# Patient Record
Sex: Female | Born: 1980 | Race: Black or African American | Hispanic: No | Marital: Single | Smoking: Former smoker
Health system: Southern US, Community
[De-identification: ages and names within clinical notes are randomized; demographics above are authoritative.]

## PROBLEM LIST (undated history)

## (undated) DIAGNOSIS — C801 Malignant (primary) neoplasm, unspecified: Secondary | ICD-10-CM

## (undated) DIAGNOSIS — K219 Gastro-esophageal reflux disease without esophagitis: Secondary | ICD-10-CM

## (undated) DIAGNOSIS — D573 Sickle-cell trait: Secondary | ICD-10-CM

## (undated) DIAGNOSIS — Z9221 Personal history of antineoplastic chemotherapy: Secondary | ICD-10-CM

## (undated) DIAGNOSIS — M419 Scoliosis, unspecified: Secondary | ICD-10-CM

---

## 2010-07-19 ENCOUNTER — Ambulatory Visit: Payer: Self-pay

## 2015-06-25 HISTORY — PX: INDUCED ABORTION: SHX677

## 2017-08-21 ENCOUNTER — Ambulatory Visit: Payer: Self-pay | Admitting: Family Medicine

## 2018-06-24 DIAGNOSIS — C50919 Malignant neoplasm of unspecified site of unspecified female breast: Secondary | ICD-10-CM

## 2018-06-24 HISTORY — DX: Malignant neoplasm of unspecified site of unspecified female breast: C50.919

## 2019-04-21 ENCOUNTER — Other Ambulatory Visit: Payer: Self-pay | Admitting: Obstetrics and Gynecology

## 2019-04-21 DIAGNOSIS — N6320 Unspecified lump in the left breast, unspecified quadrant: Secondary | ICD-10-CM

## 2019-04-21 DIAGNOSIS — R921 Mammographic calcification found on diagnostic imaging of breast: Secondary | ICD-10-CM

## 2019-04-26 ENCOUNTER — Other Ambulatory Visit: Payer: Self-pay | Admitting: Hematology

## 2019-04-28 ENCOUNTER — Ambulatory Visit
Admission: RE | Admit: 2019-04-28 | Discharge: 2019-04-28 | Disposition: A | Discharge status: Home / Self Care | Payer: BC Managed Care – PPO | Source: Ambulatory Visit | Attending: Obstetrics and Gynecology | Admitting: Obstetrics and Gynecology

## 2019-04-28 ENCOUNTER — Other Ambulatory Visit: Payer: Self-pay

## 2019-04-28 ENCOUNTER — Other Ambulatory Visit: Payer: Self-pay | Admitting: Obstetrics and Gynecology

## 2019-04-28 DIAGNOSIS — N6320 Unspecified lump in the left breast, unspecified quadrant: Secondary | ICD-10-CM

## 2019-04-28 DIAGNOSIS — R921 Mammographic calcification found on diagnostic imaging of breast: Secondary | ICD-10-CM

## 2019-04-30 ENCOUNTER — Encounter: Payer: Self-pay | Admitting: *Deleted

## 2019-04-30 ENCOUNTER — Telehealth: Payer: Self-pay | Admitting: Hematology

## 2019-04-30 ENCOUNTER — Other Ambulatory Visit: Payer: Self-pay | Admitting: *Deleted

## 2019-04-30 DIAGNOSIS — Z170 Estrogen receptor positive status [ER+]: Secondary | ICD-10-CM | POA: Insufficient documentation

## 2019-04-30 DIAGNOSIS — C50412 Malignant neoplasm of upper-outer quadrant of left female breast: Secondary | ICD-10-CM | POA: Insufficient documentation

## 2019-04-30 NOTE — Telephone Encounter (Signed)
Spoke with patient to confirm afternoon BC on 11/11 packet will be emailed to patient

## 2019-05-03 NOTE — Progress Notes (Signed)
[place]   [phone] Fax:([phone]) [place] Note   Patient Care Team: [name], MD as PCP - General (Obstetrics and Gynecology) Mauro Kaufmann, RN as Oncology Nurse Navigator Rockwell Germany, RN as Oncology Nurse Navigator Donnie Mesa, MD as Consulting Physician (General Surgery) Truitt Merle, MD as Consulting Physician (Hematology) Eppie Gibson, MD as Attending Physician (Radiation Oncology)  Date of Service:  05/05/2019   CHIEF COMPLAINTS/PURPOSE OF CONSULTATION:  Newly diagnosed Malignant neoplasm of upper-outer quadrant of left breast    Oncology History  Malignant neoplasm of upper-outer quadrant of left breast in female, estrogen receptor positive (Victoria Barker)  04/21/2019 Mammogram   Diagnostic mammogram 04/21/19  IMPRESSION 1. findings highly suspicious for left breast cancer. There is a palpable irregular mass 4cm from nipple measuring 2.4x2.4x1.8cm in the 1:00 position axis of the left breast and pleomorphic  calcifications in the upper-outer quadrant as described above.  2. single left 0.9x0.5cm axillary lymph node with a mild/borderline thickened cortex.    04/28/2019 Cancer Staging   Staging form: Breast, AJCC 8th Edition - Clinical stage from 04/28/2019: Stage IIA (cT2, cN0, cM0, G2, ER+, PR-, HER2: Equivocal) - Signed by Truitt Merle, MD on 05/04/2019   04/28/2019 Initial Biopsy   Diagnosis 04/28/19 1. Breast, left, needle core biopsy, 1 o'clock position - INVASIVE DUCTAL CARCINOMA. - DUCTAL CARCINOMA IN SITU. - SEE COMMENT. 2. Lymph node, needle/core biopsy, left axilla - THERE IS NO EVIDENCE OF CARCINOMA IN 1 OF 1 LYMPH NODE (0/1). 3. Breast, left, needle core biopsy, upper, outer quadrant - DUCTAL CARCINOMA IN SITU WITH CALCIFICATIONS. - SEE COMMENT.   04/28/2019 Receptors her2   1. PROGNOSTIC INDICATORS Results: HER2 negative  Estrogen Receptor: 80%, POSITIVE, STRONG STAINING INTENSITY Progesterone  Receptor: 0%, NEGATIVE Proliferation Marker Ki67: 35%    04/30/2019 Initial Diagnosis   Malignant neoplasm of upper-outer quadrant of left breast in female, estrogen receptor positive (Victoria Barker)      HISTORY OF PRESENTING ILLNESS:  Victoria Barker 38 y.o. female is a here because of newly diagnosed left breast cancer. The patient presents to the Breast clinic today accompanied by her friend.  She notes she felt the lump herself. She has had lump breast before and felt present and did not resolve since June or July 2020. She notes she went to have mammogram and biopsy to confirm breast cancer. She notes this is not painful and denies any other breast, weight, appetite, chest or GI change. She feels she sleeps adequately. She is overall nervous of her diagnosis.   Socially she works at [employer] in [place]. She currently has boyfriend. She has 2 children, 89 and 87 years old who she lives with. She notes she smokes Jewel pods that contains nicotine. She has been doing this for 3 years. She used to smoke cigarettes for 2 years less than 1/2ppd.   She has no past medical history other than acid reflux, on dexilant and well controlled. She has no history of major surgery. She denies family history of cancer. She notes HTN runs in her family with MGM and mother. She sees her Gyn Dr. Adah Perl and does not have a set PCP.    GYN HISTORY  Menarchal: 13 LMP: NA, on concentrations  Contraceptive: Depo shot for 8 years, Mirena (past 2 years) HRT: NA G2P2: first at age 46   REVIEW OF SYSTEMS:    Constitutional: Denies fevers, chills or abnormal night sweats Eyes: Denies blurriness of vision, double  vision or watery eyes Ears, nose, mouth, throat, and face: Denies mucositis or sore throat Respiratory: Denies cough, dyspnea or wheezes Cardiovascular: Denies palpitation, chest discomfort or lower extremity swelling Gastrointestinal:  Denies nausea, heartburn or change in bowel habits Skin: Denies  abnormal skin rashes Lymphatics: Denies new lymphadenopathy or easy bruising Neurological:Denies numbness, tingling or new weaknesses Behavioral/Psych: Mood is stable, no new changes (+) anxious, nervous  BREAST: (+) enlargement of right breast  All other systems were reviewed with the patient and are negative.   MEDICAL HISTORY:  History reviewed. No pertinent past medical history.  SURGICAL HISTORY: History reviewed. No pertinent surgical history.  SOCIAL HISTORY: Social History   Socioeconomic History   Marital status: Single    Spouse name: Not on file   Number of children: 2   Years of education: Not on file   Highest education level: Not on file  Occupational History   Occupation: [employer]   Social Needs   Financial resource strain: Not on file   Food insecurity    Worry: Not on file    Inability: Not on file   Transportation needs    Medical: Not on file    Non-medical: Not on file  Tobacco Use   Smoking status: Former Smoker    Packs/day: 0.25    Years: 5.00    Pack years: 1.25   Smokeless tobacco: Current User  Substance and Sexual Activity   Alcohol use: Not Currently   Drug use: Yes    Types: Marijuana   Sexual activity: Not on file  Lifestyle   Physical activity    Days per week: Not on file    Minutes per session: Not on file   Stress: Not on file  Relationships   Social connections    Talks on phone: Not on file    Gets together: Not on file    Attends religious service: Not on file    Active member of club or organization: Not on file    Attends meetings of clubs or organizations: Not on file    Relationship status: Not on file   Intimate partner violence    Fear of current or ex partner: Not on file    Emotionally abused: Not on file    Physically abused: Not on file    Forced sexual activity: Not on file  Other Topics Concern   Not on file  Social History Narrative   Not on file    FAMILY HISTORY: Family History    Problem Relation Age of Onset   Hypertension Mother    Hypertension Maternal Grandmother     ALLERGIES:  has No Known Allergies.  MEDICATIONS:  Current Outpatient Medications  Medication Sig Dispense Refill   dexlansoprazole (DEXILANT) 60 MG capsule Take 60 mg by mouth daily.     Lactobacillus-Inulin ([place] PO) Take by mouth.     MULTIPLE VITAMIN PO Take by mouth.     No current facility-administered medications for this visit.     PHYSICAL EXAMINATION: ECOG PERFORMANCE STATUS: 0 - Asymptomatic  Vitals:   05/05/19 1235  BP: 112/83  Pulse: 89  Resp: 18  Temp: 98.7 F (37.1 C)  SpO2: 100%   Filed Weights   05/05/19 1235  Weight: 117 lb 11.2 oz (53.4 kg)    GENERAL:alert, no distress and comfortable SKIN: skin color, texture, turgor are normal, no rashes or significant lesions EYES: normal, Conjunctiva are pink and non-injected, sclera clear  NECK: supple, thyroid normal size,  non-tender, without nodularity LYMPH:  no palpable lymphadenopathy in the cervical, axillary  LUNGS: clear to auscultation and percussion with normal breathing effort HEART: regular rate & rhythm and no murmurs and no lower extremity edema ABDOMEN:abdomen soft, non-tender and normal bowel sounds Musculoskeletal:no cyanosis of digits and no clubbing  NEURO: alert & oriented x 3 with fluent speech, no focal motor/sensory deficits BREAST: (+) Lumpy breast tissue (+) palpable mass 4X5cm at 1:00 position of right breast. No palpable mass, nodules or adenopathy bilaterally. Breast exam benign.  LABORATORY DATA:  I have reviewed the data as listed CBC Latest Ref Rng & Units 05/05/2019  WBC 4.0 - 10.5 K/uL 5.8  Hemoglobin 12.0 - 15.0 g/dL 12.9  Hematocrit 36.0 - 46.0 % 37.9  Platelets 150 - 400 K/uL 205    CMP Latest Ref Rng & Units 05/05/2019  Glucose 70 - 99 mg/dL 152(H)  BUN 6 - 20 mg/dL 11  Creatinine 0.44 - 1.00 mg/dL 0.85  Sodium 135 - 145 mmol/L 138  Potassium  3.5 - 5.1 mmol/L 3.7  Chloride 98 - 111 mmol/L 103  CO2 22 - 32 mmol/L 28  Calcium 8.9 - 10.3 mg/dL 9.1  Total Protein 6.5 - 8.1 g/dL 6.9  Total Bilirubin 0.3 - 1.2 mg/dL 0.5  Alkaline Phos 38 - 126 U/L 52  AST 15 - 41 U/L 15  ALT 0 - 44 U/L 9     RADIOGRAPHIC STUDIES: I have personally reviewed the radiological images as listed and agreed with the findings in the report. [place] Axillary Node Core Biopsy Left  Addendum Date: 04/29/2019   ADDENDUM REPORT: 04/29/2019 15:41 ADDENDUM: Pathology revealed 1- GRADE II INVASIVE DUCTAL CARCINOMA of the LEFT breast, 1 o'clock position. SEE COMMENT. This was found to be concordant by Dr. Lajean Manes. Pathology revealed 2- THERE IS NO EVIDENCE OF CARCINOMA IN 1 OF 1 LYMPH NODE (0/1) of the LEFT axilla. This was found to be concordant by Dr. Lajean Manes. 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 SEE COMMENT. This was found to be concordant by Dr. Lajean Manes. Microscopic Comment: 1. The carcinoma appears grade II. A breast prognostic profile will be performed and the results reported separately. 3. The ductal carcinoma in situ is very focal. Estrogen receptor and progesterone receptor studies will be attempted on part 3 and the results reported separately. Dr. Carollee Herter of [place] discussed Pathology results by telephone with her patient. The patient reported doing well after the biopsies with tenderness at the sites. Post biopsy instructions and care were reviewed and questions were answered. The patient was encouraged to call [facility] for any additional concerns. Per patient request, a referral was made to [facility] at [facility] on May 05, 2019. Pathology results reported by Stacie Acres, RN on 04/29/2019. Electronically Signed   By: Lajean Manes M.D.   On:  04/29/2019 15:41   Result Date: 04/29/2019 CLINICAL DATA:  Patient presents for ultrasound-guided core needle biopsy of a spiculated upper-outer quadrant left breast mass as well as a borderline abnormal left axillary lymph node. She is also scheduled to undergo stereotactic biopsy of calcifications extending anterior from the left breast mass. EXAM: ULTRASOUND GUIDED LEFT BREAST CORE NEEDLE BIOPSY ULTRASOUND GUIDED LEFT AXILLARY LYMPH NODE CORE NEEDLE BIOPSY COMPARISON:  Previous exam(s). FINDINGS: I met with the patient and we discussed the procedure of ultrasound-guided biopsy,  including benefits and alternatives. We discussed the high likelihood of a successful procedure. We discussed the risks of the procedure, including infection, bleeding, tissue injury, clip migration, and inadequate sampling. Informed written consent was given. The usual time-out protocol was performed immediately prior to the procedure. Lesion #1: Breast Mass. Lesion quadrant: Upper outer quadrant Using sterile technique and 1% Lidocaine as local anesthetic, under direct ultrasound visualization, a 12 gauge spring-loaded device was used to perform biopsy of the 1 o'clock position spiculated left breast mass using a inferolateral approach. At the conclusion of the procedure ribbon shaped tissue marker clip was deployed into the biopsy cavity. Lesion #2: Axillary Lymph Node. Lesion location: Left axilla. Using sterile technique and 1% Lidocaine as local anesthetic, under direct ultrasound visualization, a 14 gauge spring-loaded device was used to perform biopsy of the borderline abnormal left axillary lymph node using a inferolateral approach. At the conclusion of the procedure Q shaped,HydroMARK tissue marker clip was deployed into the biopsy cavity. Follow up 2 view mammogram was performed and dictated separately. IMPRESSION: Ultrasound guided biopsy of a left breast mass and a borderline abnormal left axillary lymph node. No apparent  complications. Electronically Signed: By: Lajean Manes M.D. On: 04/28/2019 08:25   Mm Clip Placement Left  Result Date: 04/28/2019 CLINICAL DATA:  Post biopsy marker clip placement evaluation. Patient underwent ultrasound-guided core needle biopsy of a left breast mass, a borderline abnormal left axillary lymph node and stereotactic core needle biopsy of calcifications. EXAM: DIAGNOSTIC LEFT MAMMOGRAM POST ULTRASOUND AND STEREOTACTIC BIOPSY COMPARISON:  Previous exam(s). FINDINGS: Mammographic images were obtained following ultrasound-guided and stereotactic guided biopsy of a left breast mass, borderline abnormal left axillary lymph node and calcifications extending linearly anteriorly from the left breast mass. The ribbon shaped biopsy clip lies within the posterosuperior margin of the left breast mass, the coil shaped biopsy clip lies approximately 5 mm superior and lateral to the residual linear left breast calcifications, 4.3 cm anterior to the ribbon clip. The Q shaped is partly imaged in the left axilla. IMPRESSION: Appropriate positioning of the ribbon shaped, coil shaped and Q shaped biopsy marking clips at the site of biopsy in the left breast associated with the mass, anterior upper outer quadrant along the linear calcifications and in the left axilla. Final Assessment: Post Procedure Mammograms for Marker Placement Electronically Signed   By: Lajean Manes M.D.   On: 04/28/2019 09:09   Mm Lt Breast Bx W Loc Dev 1st Lesion Image Bx Spec Stereo Guide  Addendum Date: 04/29/2019   ADDENDUM REPORT: 04/29/2019 15:43 ADDENDUM: Pathology revealed 1- GRADE II INVASIVE DUCTAL CARCINOMA of the LEFT breast, 1 o'clock position. SEE COMMENT. This was found to be concordant by Dr. Lajean Manes. Pathology revealed 2- THERE IS NO EVIDENCE OF CARCINOMA IN 1 OF 1 LYMPH NODE (0/1) of the LEFT axilla. This was found to be concordant by Dr. Lajean Manes. Pathology revealed 3- DUCTAL CARCINOMA IN SITU WITH  CALCIFICATIONS of the LEFT breast, upper outer quadrant. SEE COMMENT. This was found to be concordant by Dr. Lajean Manes. Microscopic Comment: 1. The carcinoma appears grade II. A breast prognostic profile will be performed and the results reported separately. 3. The ductal carcinoma in situ is very focal. Estrogen receptor and progesterone receptor studies will be attempted on part 3 and the results reported separately. Dr. Carollee Herter of [place] discussed Pathology results by telephone with her patient. The patient reported doing well after the biopsies with tenderness at the  sites. Post biopsy instructions and care were reviewed and questions were answered. The patient was encouraged to call [facility] for any additional concerns. Per patient request, a referral was made to [facility] at [facility] on May 05, 2019. Pathology results reported by Stacie Acres, RN on 04/29/2019. Electronically Signed   By: Lajean Manes M.D.   On: 04/29/2019 15:43   Result Date: 04/29/2019 CLINICAL DATA:  Patient presents for stereotactic core needle biopsy of calcifications that extend anterior to a highly suspicious left breast mass, biopsy earlier today under ultrasound guidance. EXAM: LEFT BREAST STEREOTACTIC CORE NEEDLE BIOPSY COMPARISON:  Previous exams. FINDINGS: The patient and I discussed the procedure of stereotactic-guided biopsy including benefits and alternatives. We discussed the high likelihood of a successful procedure. We discussed the risks of the procedure including infection, bleeding, tissue injury, clip migration, and inadequate sampling. Informed written consent was given. The usual time out protocol was performed immediately prior to the procedure. Using sterile technique and 1% Lidocaine as local anesthetic, under stereotactic guidance, a 9 gauge vacuum assisted device was used to  perform core needle biopsy of calcifications in the upper outer quadrant of the left breast using a superior approach. Specimen radiograph was performed showing calcifications for which biopsy were performed. Specimens with calcifications are identified for pathology. Lesion quadrant: Upper outer quadrant At the conclusion of the procedure, coil shaped tissue marker clip was deployed into the biopsy cavity. Follow-up 2-view mammogram was performed and dictated separately. IMPRESSION: Stereotactic-guided biopsy of left breast calcifications. No apparent complications. Electronically Signed: By: Lajean Manes M.D. On: 04/28/2019 09:00   [place] Lt Breast Bx W Loc Dev 1st Lesion Img Bx Spec US Guide  Addendum Date: 04/29/2019   ADDENDUM REPORT: 04/29/2019 15:41 ADDENDUM: Pathology revealed 1- GRADE II INVASIVE DUCTAL CARCINOMA of the LEFT breast, 1 o'clock position. SEE COMMENT. This was found to be concordant by Dr. Lajean Manes. Pathology revealed 2- THERE IS NO EVIDENCE OF CARCINOMA IN 1 OF 1 LYMPH NODE (0/1) of the LEFT axilla. This was found to be concordant by Dr. Lajean Manes. 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 SEE COMMENT. This was found to be concordant by Dr. Lajean Manes. Microscopic Comment: 1. The carcinoma appears grade II. A breast prognostic profile will be performed and the results reported separately. 3. The ductal carcinoma in situ is very focal. Estrogen receptor and progesterone receptor studies will be attempted on part 3 and the results reported separately. Dr. Carollee Herter of [place] discussed Pathology results by telephone with her patient. The patient reported doing well after the biopsies with tenderness at the sites. Post biopsy instructions and care were reviewed and questions were answered. The patient was encouraged to call [facility] for any additional  concerns. Per patient request, a referral was made to [facility] at [facility] on May 05, 2019. Pathology results reported by Stacie Acres, RN on 04/29/2019. Electronically Signed   By: Lajean Manes M.D.   On: 04/29/2019 15:41   Result Date: 04/29/2019 CLINICAL DATA:  Patient presents for ultrasound-guided core needle biopsy of a spiculated upper-outer quadrant left breast mass as well as a borderline abnormal left axillary lymph node. She is also scheduled to undergo stereotactic biopsy of calcifications extending anterior from the left breast mass. EXAM: ULTRASOUND  GUIDED LEFT BREAST CORE NEEDLE BIOPSY ULTRASOUND GUIDED LEFT AXILLARY LYMPH NODE CORE NEEDLE BIOPSY COMPARISON:  Previous exam(s). FINDINGS: I met with the patient and we discussed the procedure of ultrasound-guided biopsy, including benefits and alternatives. We discussed the high likelihood of a successful procedure. We discussed the risks of the procedure, including infection, bleeding, tissue injury, clip migration, and inadequate sampling. Informed written consent was given. The usual time-out protocol was performed immediately prior to the procedure. Lesion #1: Breast Mass. Lesion quadrant: Upper outer quadrant Using sterile technique and 1% Lidocaine as local anesthetic, under direct ultrasound visualization, a 12 gauge spring-loaded device was used to perform biopsy of the 1 o'clock position spiculated left breast mass using a inferolateral approach. At the conclusion of the procedure ribbon shaped tissue marker clip was deployed into the biopsy cavity. Lesion #2: Axillary Lymph Node. Lesion location: Left axilla. Using sterile technique and 1% Lidocaine as local anesthetic, under direct ultrasound visualization, a 14 gauge spring-loaded device was used to perform biopsy of the borderline abnormal left axillary lymph node using a inferolateral approach. At the conclusion of the  procedure Q shaped,HydroMARK tissue marker clip was deployed into the biopsy cavity. Follow up 2 view mammogram was performed and dictated separately. IMPRESSION: Ultrasound guided biopsy of a left breast mass and a borderline abnormal left axillary lymph node. No apparent complications. Electronically Signed: By: Lajean Manes M.D. On: 04/28/2019 08:25    ASSESSMENT & PLAN:  Victoria Barker is a 38 y.o. African American female with a history of Acid Reflux   1 Malignant neoplasm of upper-outer quadrant of left breast, invasive ductal carcinoma and DCIS, stage IIA, c(T2N0M0), ER+/PR-/HER2-, Grade II -We discussed her image findings and the biopsy results in great details. She has 2 left breast masses with DCIS and 1 mass with Invasive ductal carcinoma, her abnormal lymph node was biopsied and found to be negative.  -Her invasive tumor was ER positive, PR negative, HER-2 equivocal by IHC, negative by FISH (per Dr. Jeral Pinch this morning in the conference) -Given 2 breast masses and abnormal LN she will have Breast MRI before she proceeds with surgery, she is agreeable.  -If breast MRI does not show more extensive disease, she likely is a candidate for lumpectomy with sentinel lymph node biopsy. She is agreeable with that. She was seen by Dr. Georgette Dover today and likely will proceed with surgery soon.  -I recommend a Oncotype Dx test on the surgical sample and we'll make a decision about adjuvant chemotherapy based on the Oncotype result. Written material of this test was given to her. She is young and fit, would be a good candidate for chemotherapy if her Oncotype recurrence score is high. -If her surgical sentinel lymph node positive, I recommend mammaprint for further risk stratification and guide adjuvant chemotherapy. -will probably repeat HER-2 on her surgical sample, if it's positive, will treatment with adjuvant chemotherapy and anti-HER-2 therapy. -She was also seen by radiation oncologist Dr. Isidore Moos  today. Post lumpectomy radiation is recommended to reduce the risk for local recurrence.  -Given the strong ER expression in her premenopausal status, I recommend adjuvant endocrine therapy with Tamoxifen or Aromatase inhibitor with Ovarian Suppressor/BSO for a total of 7-10 years to reduce the risk of cancer recurrence. Potential benefits and side effects were discussed with patient and she is interested. -She is currently on Mirena IUD which is estrogen based. I recommend she see Gyn for removal and to avoid estrogen based products, she is agreeable.  -We also  discussed the breast cancer surveillance after her surgery. She will continue annual screening mammogram, self exam, and a routine office visit with lab and exam with [place]. -I encouraged her to have healthy diet and exercise regularly -Labs reviewed, CBC and CMP WNL except BG 152. Physical exam shows 4-5cm palpable right breast mass.  -F/u after surgery or Radiation.    2. Nicotine use, Smoking Cessation  -She smoked cigarettes for 2 years, less than 1/2ppd. She currently smokes jewel pod containing nicotine for the past 3 years.  -I reviewed smoking cessation with her. She is willing to quit.   3. Acid Reflux -Well controlled on Dexilant   PLAN:  -Breast MRI in 1-2 weeks  -she will proceed with left breast surgery soon   -F/u after surgery or radiation   No orders of the defined types were placed in this encounter.   All questions were answered. The patient knows to call the clinic with any problems, questions or concerns. I spent 40 minutes counseling the patient face to face. The total time spent in the appointment was 50 minutes and more than 50% was on counseling.     Truitt Merle, MD 05/05/2019 4:42 PM  I, Joslyn Devon, am acting as scribe for Truitt Merle, MD.   I have reviewed the above documentation for accuracy and completeness, and I agree with the above.

## 2019-05-05 ENCOUNTER — Encounter: Payer: Self-pay | Admitting: Hematology

## 2019-05-05 ENCOUNTER — Other Ambulatory Visit: Payer: Self-pay

## 2019-05-05 ENCOUNTER — Ambulatory Visit
Admission: RE | Admit: 2019-05-05 | Discharge: 2019-05-05 | Disposition: A | Discharge status: Home / Self Care | Payer: BC Managed Care – PPO | Source: Ambulatory Visit | Attending: Radiation Oncology | Admitting: Radiation Oncology

## 2019-05-05 ENCOUNTER — Ambulatory Visit: Payer: BC Managed Care – PPO | Attending: Surgery | Admitting: Physical Therapy

## 2019-05-05 ENCOUNTER — Ambulatory Visit: Payer: Self-pay | Admitting: Surgery

## 2019-05-05 ENCOUNTER — Encounter: Payer: Self-pay | Admitting: Licensed Clinical Social Worker

## 2019-05-05 ENCOUNTER — Other Ambulatory Visit: Payer: Self-pay | Admitting: *Deleted

## 2019-05-05 ENCOUNTER — Inpatient Hospital Stay: Payer: BC Managed Care – PPO

## 2019-05-05 ENCOUNTER — Encounter: Payer: Self-pay | Admitting: Physical Therapy

## 2019-05-05 ENCOUNTER — Ambulatory Visit (HOSPITAL_BASED_OUTPATIENT_CLINIC_OR_DEPARTMENT_OTHER): Payer: BC Managed Care – PPO | Admitting: Licensed Clinical Social Worker

## 2019-05-05 ENCOUNTER — Inpatient Hospital Stay: Payer: BC Managed Care – PPO | Attending: Hematology | Admitting: Hematology

## 2019-05-05 VITALS — BP 112/83 | HR 89 | Temp 98.7°F | Resp 18 | Ht 62.0 in | Wt 117.7 lb

## 2019-05-05 DIAGNOSIS — Z170 Estrogen receptor positive status [ER+]: Secondary | ICD-10-CM | POA: Diagnosis not present

## 2019-05-05 DIAGNOSIS — C50412 Malignant neoplasm of upper-outer quadrant of left female breast: Secondary | ICD-10-CM

## 2019-05-05 DIAGNOSIS — F1290 Cannabis use, unspecified, uncomplicated: Secondary | ICD-10-CM | POA: Diagnosis not present

## 2019-05-05 DIAGNOSIS — R293 Abnormal posture: Secondary | ICD-10-CM | POA: Insufficient documentation

## 2019-05-05 DIAGNOSIS — F17290 Nicotine dependence, other tobacco product, uncomplicated: Secondary | ICD-10-CM | POA: Insufficient documentation

## 2019-05-05 DIAGNOSIS — K219 Gastro-esophageal reflux disease without esophagitis: Secondary | ICD-10-CM | POA: Insufficient documentation

## 2019-05-05 LAB — CMP (CANCER CENTER ONLY)
ALT: 9 U/L (ref 0–44)
AST: 15 U/L (ref 15–41)
Albumin: 4 g/dL (ref 3.5–5.0)
Alkaline Phosphatase: 52 U/L (ref 38–126)
Anion gap: 7 (ref 5–15)
BUN: 11 mg/dL (ref 6–20)
CO2: 28 mmol/L (ref 22–32)
Calcium: 9.1 mg/dL (ref 8.9–10.3)
Chloride: 103 mmol/L (ref 98–111)
Creatinine: 0.85 mg/dL (ref 0.44–1.00)
GFR, Est AFR Am: >60 mL/min (ref >60)
GFR, Estimated: >60 mL/min (ref >60)
Glucose, Bld: 152 mg/dL — ABNORMAL HIGH (ref 70–99)
Potassium: 3.7 mmol/L (ref 3.5–5.1)
Sodium: 138 mmol/L (ref 135–145)
Total Bilirubin: 0.5 mg/dL (ref 0.3–1.2)
Total Protein: 6.9 g/dL (ref 6.5–8.1)

## 2019-05-05 LAB — CBC WITH DIFFERENTIAL (CANCER CENTER ONLY)
Abs Immature Granulocytes: 0.01 10*3/uL (ref 0.00–0.07)
Basophils Absolute: 0.1 10*3/uL (ref 0.0–0.1)
Basophils Relative: 1 %
Eosinophils Absolute: 0.1 10*3/uL (ref 0.0–0.5)
Eosinophils Relative: 1 %
HCT: 37.9 % (ref 36.0–46.0)
Hemoglobin: 12.9 g/dL (ref 12.0–15.0)
Immature Granulocytes: 0 %
Lymphocytes Relative: 38 %
Lymphs Abs: 2.2 10*3/uL (ref 0.7–4.0)
MCH: 29.9 pg (ref 26.0–34.0)
MCHC: 34 g/dL (ref 30.0–36.0)
MCV: 87.7 fL (ref 80.0–100.0)
Monocytes Absolute: 0.3 10*3/uL (ref 0.1–1.0)
Monocytes Relative: 5 %
Neutro Abs: 3.1 10*3/uL (ref 1.7–7.7)
Neutrophils Relative %: 55 %
Platelet Count: 205 10*3/uL (ref 150–400)
RBC: 4.32 MIL/uL (ref 3.87–5.11)
RDW: 12.6 % (ref 11.5–15.5)
WBC Count: 5.8 10*3/uL (ref 4.0–10.5)
nRBC: 0 % (ref 0.0–0.2)

## 2019-05-05 NOTE — H&P (Signed)
History of Present Illness Victoria Barker. Victoria Swiney MD; 05/05/2019 3:14 PM) The patient is a 38 year old female who presents with breast cancer. [name]  This is a 38 year old female from [place], [place] who initially felt a palpable mass in her left breast about a year ago. This fluctuated in size initially but later this summer, became more prominent. She underwent work-up including biopsy that revealed invasive ductal carcinoma with DCIS at 0100. She also had some microcalcifications extending anteriorly from the mass in the LUOQ which revealed DCIS. A single lymph node was biopsied and was negative. ER+/ PR-, Ki67-35%.   Menarche - 45 First pregnancy - 17 Breastfeed - no OCP/ Depo Provera/ Mirena - current Family history - negative for breast cancer   CLINICAL DATA: Patient presents for ultrasound-guided core needle biopsy of a spiculated upper-outer quadrant left breast mass as well as a borderline abnormal left axillary lymph node. She is also scheduled to undergo stereotactic biopsy of calcifications extending anterior from the left breast mass.  EXAM: ULTRASOUND GUIDED LEFT BREAST CORE NEEDLE BIOPSY  ULTRASOUND GUIDED LEFT AXILLARY LYMPH NODE CORE NEEDLE BIOPSY  COMPARISON: Previous exam(s).  FINDINGS: I met with the patient and we discussed the procedure of ultrasound-guided biopsy, including benefits and alternatives. We discussed the high likelihood of a successful procedure. We discussed the risks of the procedure, including infection, bleeding, tissue injury, clip migration, and inadequate sampling. Informed written consent was given. The usual time-out protocol was performed immediately prior to the procedure.  Lesion #1: Breast Mass.  Lesion quadrant: Upper outer quadrant  Using sterile technique and 1% Lidocaine as local anesthetic, under direct ultrasound visualization, a 12 gauge spring-loaded device was used to perform biopsy of the  1 o'clock position spiculated left breast mass using a inferolateral approach. At the conclusion of the procedure ribbon shaped tissue marker clip was deployed into the biopsy cavity.  Lesion #2: Axillary Lymph Node.  Lesion location: Left axilla.  Using sterile technique and 1% Lidocaine as local anesthetic, under direct ultrasound visualization, a 14 gauge spring-loaded device was used to perform biopsy of the borderline abnormal left axillary lymph node using a inferolateral approach. At the conclusion of the procedure Q shaped,HydroMARK tissue marker clip was deployed into the biopsy cavity.  Follow up 2 view mammogram was performed and dictated separately.  IMPRESSION: Ultrasound guided biopsy of a left breast mass and a borderline abnormal left axillary lymph node. No apparent complications.  Electronically Signed: By: Lajean Manes M.D. On: 04/28/2019 08:25   Past Surgical History Tawni Pummel, RN; 05/05/2019 7:29 AM) Breast Biopsy Left. Oral Surgery  Diagnostic Studies History Tawni Pummel, RN; 05/05/2019 7:29 AM) Colonoscopy never Mammogram within last year Pap Smear 1-5 years ago  Allergies [name], MD; 05/05/2019 3:14 PM) No Known Allergies [05/03/2019]:  Medication History Victoria Barker. Victoria Pribyl, MD; 05/05/2019 3:14 PM) Medications Reconciled Dexilant (60MG Capsule DR, Oral) Active.  Social History Tawni Pummel, RN; 05/05/2019 7:30 AM) Caffeine use Carbonated beverages, Coffee, Tea. No alcohol use No drug use Tobacco use Former smoker.  Family History Tawni Pummel, RN; 05/05/2019 7:30 AM) First Degree Relatives No pertinent family history  Pregnancy / Birth History Tawni Pummel, RN; 05/05/2019 7:30 AM) Age at menarche 78 years. Contraceptive History Oral contraceptives.  Other Problems Tawni Pummel, RN; 05/05/2019 7:30 AM) Gastroesophageal Reflux Disease     Review of Systems [name] RN;  05/05/2019 7:30 AM) General Not Present- Appetite Loss, Chills, Fatigue, Fever, Night  Sweats, Weight Gain and Weight Loss. Skin Not Present- Change in Wart/Mole, Dryness, Hives, Jaundice, New Lesions, Non-Healing Wounds, Rash and Ulcer. Breast Not Present- Breast Mass, Breast Pain, Nipple Discharge and Skin Changes. Cardiovascular Not Present- Chest Pain, Difficulty Breathing Lying Down, Leg Cramps, Palpitations, Rapid Heart Rate, Shortness of Breath and Swelling of Extremities. Female Genitourinary Not Present- Frequency, Nocturia, Painful Urination, Pelvic Pain and Urgency. Musculoskeletal Not Present- Back Pain, Joint Pain, Joint Stiffness, Muscle Pain, Muscle Weakness and Swelling of Extremities. Neurological Not Present- Decreased Memory, Fainting, Headaches, Numbness, Seizures, Tingling, Tremor, Trouble walking and Weakness. Endocrine Not Present- Cold Intolerance, Excessive Hunger, Hair Changes, Heat Intolerance, Hot flashes and New Diabetes.   Physical Exam Rodman Key K. Deiondre Harrower MD; 05/05/2019 3:15 PM)  The physical exam findings are as follows: Note:WDWN in NAD Eyes: Pupils equal, round; sclera anicteric HENT: Oral mucosa moist; good dentition Neck: No masses palpated, no thyromegaly Lungs: CTA bilaterally; normal respiratory effort Breasts: symmetric; bilateral fibrocystic changes; no nipple retraction or discharge; no axillary lymphadenopathy; firmness in LUOQ CV: Regular rate and rhythm; no murmurs; extremities well-perfused with no edema Abd: +bowel sounds, soft, non-tender, no palpable organomegaly; no palpable hernias Skin: Warm, dry; no sign of jaundice Psychiatric - alert and oriented x 4; calm mood and affect    Assessment & Plan Rodman Key K. 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 1.8 cm at 0100 4 cmfn. Calcifications extending anteriorly toward the nipple. Total area 7.6 cm anterior-posterior x  6.2 cm x 4.9 cm  Note:I spent approximately 45-50 minutes with the patient and her cousin discussing her diagnosis and the surgical treatment options. We discussed mastectomy with or without reconstruction. We discussed nipple-sparing mastectomy. We also discussed breast conserving therapy, which would be a fairly large lumpectomy in her, with sentinel lymph node biopsy followed by radiation.   We will plan breast MRI to better define the extent of disease Genetics - positive test will help guide our decision-making  Victoria Barker. Georgette Dover, MD, [facility]  General/ Trauma Surgery   05/05/2019 3:20 PM

## 2019-05-05 NOTE — Therapy (Signed)
[place], [place], [address] Phone: [phone]   Fax:  [phone]  Physical Therapy Evaluation  Patient Details  Name: Victoria Barker MRN: [id number] Date of Birth: Nov 17, 1980 Referring Provider (PT): Dr. Donnie Mesa   Encounter Date: 05/05/2019  PT End of Session - 05/05/19 1413    Visit Number  1    Number of Visits  2    Date for PT Re-Evaluation  06/30/19    PT Start Time  4401    PT Stop Time  1450    PT Time Calculation (min)  30 min    Activity Tolerance  Patient tolerated treatment well    Behavior During Therapy  [facility] for tasks assessed/performed       History reviewed. No pertinent past medical history.  History reviewed. No pertinent surgical history.  There were no vitals filed for this visit.   Subjective Assessment - 05/05/19 1354    Subjective  Patient reports she is here today to be seen by her medical team for her newly diagnosed left breast cancer.    Patient is accompained by:  Family member    Pertinent History  Patient was diagnosed on 04/21/2019 with left invasive ductal carcinoma breast cancer. It measures 2.4 cm and is located in the upper outer quadrant. It is ER positive / PR negative and HER2 negative with a Ki67 of 35%.    Patient Stated Goals  Reduce lymphedema risk and learn post op shoulder ROM HEP    Currently in Pain?  No/denies         [facility] PT Assessment - 05/05/19 0001      Assessment   Medical Diagnosis  Left breast cancer    Referring Provider (PT)  Dr. Donnie Mesa    Onset Date/Surgical Date  04/21/19    Hand Dominance  Right    Prior Therapy  none      Precautions   Precautions  Other (comment)    Precaution Comments  active cancer      Restrictions   Weight Bearing Restrictions  No      Balance Screen   Has the patient fallen in the past 6 months  No    Has the patient had a decrease in activity level because of a fear of falling?    No    Is the patient reluctant to leave their home because of a fear of falling?   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    Leisure  She does not exercise      Cognition   Overall Cognitive Status  Within Functional Limits for tasks assessed      Posture/Postural Control   Posture/Postural Control  Postural limitations    Postural Limitations  Rounded Shoulders;Forward head      ROM / Strength   AROM / PROM / Strength  AROM;Strength      AROM   AROM Assessment Site  Shoulder    Right/Left Shoulder  Right;Left    Right Shoulder Extension  45 Degrees    Right Shoulder Flexion  153 Degrees    Right Shoulder ABduction  176 Degrees    Right Shoulder Internal Rotation  66 Degrees    Right Shoulder External Rotation  90 Degrees    Left Shoulder Extension  56 Degrees    Left Shoulder Flexion  150 Degrees    Left Shoulder ABduction  175 Degrees    Left Shoulder Internal Rotation  76 Degrees    Left Shoulder External Rotation  77 Degrees      Strength   Overall Strength  Within functional limits for tasks performed        LYMPHEDEMA/ONCOLOGY QUESTIONNAIRE - 05/05/19 1412      Type   Cancer Type  Left breast cancer      Lymphedema Assessments   Lymphedema Assessments  Upper extremities      Right Upper Extremity Lymphedema   10 cm Proximal to Olecranon Process  25.1 cm    Olecranon Process  21.6 cm    10 cm Proximal to Ulnar Styloid Process  22 cm    Just Proximal to Ulnar Styloid Process  15 cm    Across Hand at [employer]  18.3 cm    At [place] of 2nd Digit  5.7 cm      Left Upper Extremity Lymphedema   10 cm Proximal to Olecranon Process  21.8 cm    15 cm  Proximal to Ulnar Styloid Process  21.1 cm    10 cm Proximal to Ulnar Styloid Process  14.5 cm    Just Proximal to Ulnar Styloid Process  18 cm    Across Hand at [employer]  5.7 cm          Katina Dung - 05/05/19 0001    Open a tight or new jar  No difficulty    Do heavy household chores (wash walls, wash floors)  No difficulty    Carry a shopping bag or briefcase  No difficulty    Wash your back  No difficulty    Use a knife to cut food  No difficulty    Recreational activities in which you take some force or impact through your arm, shoulder, or hand (golf, hammering, tennis)  No difficulty    During the past week, to what extent has your arm, shoulder or hand problem interfered with your normal social activities with family, friends, neighbors, or groups?  Not at all    During the past week, to what extent has your arm, shoulder or hand problem limited your work or other regular daily activities  Not at all    Arm, shoulder, or hand pain.  None    Tingling (pins and needles) in your arm, shoulder, or hand  None    Difficulty Sleeping  No difficulty    DASH Score  0 %        Objective measurements completed on examination: See above findings.     Patient was instructed today in a home exercise program today for post op shoulder range of motion. These included active assist shoulder flexion in sitting, scapular retraction, wall walking with shoulder abduction, and hands behind head external rotation.  She was encouraged to do these twice a day, holding 3 seconds and repeating 5 times when permitted by her physician.         PT Education - 05/05/19 1412    Education Details  Lymphedema risk reduction and post op shoulder ROM HEP    Person(s) Educated  Patient  Methods  Explanation;Demonstration;Handout    Comprehension  Returned demonstration;Verbalized understanding          PT Long Term Goals - 05/05/19 1417      PT LONG TERM GOAL #1   Title  Patient will  demonstrate she has regained full shoulder ROM and function post operatively compared to baselines.    Time  8    Period  Weeks    Status  New    Target Date  06/30/19      Breast Clinic Goals - 05/05/19 1417      Patient will be able to verbalize understanding of pertinent lymphedema risk reduction practices relevant to her diagnosis specifically related to skin care.   Time  1    Period  Days    Status  Achieved      Patient will be able to return demonstrate and/or verbalize understanding of the post-op home exercise program related to regaining shoulder range of motion.   Time  1    Period  Days    Status  Achieved      Patient will be able to verbalize understanding of the importance of attending the postoperative After Breast Cancer Class for further lymphedema risk reduction education and therapeutic exercise.   Time  1    Period  Days    Status  Achieved            Plan - 05/05/19 1413    Clinical Impression Statement  Patient was diagnosed on 04/21/2019 with left invasive ductal carcinoma breast cancer. It measures 2.4 cm and is located in the upper outer quadrant. It is ER positive / PR negative and HER2 negative with a Ki67 of 35%. Her multidisciplinary medical team met prior to her assessments to determine a recommended treatment plan. She is planning to have a left lumpectomy and sentinel node biopsy followed by Oncotype testing, radiation, and anti-estrogen therapy. She will benefit from a post op PT reassessment to determine needs.    Stability/Clinical Decision Making  Stable/Uncomplicated    Clinical Decision Making  Low    Rehab Potential  Excellent    PT Frequency  --   Eval and 1 f/u visit   PT Treatment/Interventions  ADLs/Self Care Home Management;Therapeutic exercise;Patient/family education    PT Next Visit Plan  Will reassess 3-4 weeks post op to determine needs    PT Home Exercise Plan  Post op shoulder ROM HEP    Consulted and Agree with Plan of  Care  Patient       Patient will benefit from skilled therapeutic intervention in order to improve the following deficits and impairments:  Postural dysfunction, Decreased range of motion, Decreased knowledge of precautions, Impaired UE functional use, Pain  Visit Diagnosis: Malignant neoplasm of upper-outer quadrant of left breast in female, estrogen receptor positive ([place]) - Plan: PT plan of care cert/re-cert  Abnormal posture - Plan: PT plan of care cert/re-cert   Patient will follow up at outpatient cancer rehab 3-4 weeks following surgery.  If the patient requires physical therapy at that time, a specific plan will be dictated and sent to the referring physician for approval. The patient was educated today on appropriate basic range of motion exercises to begin post operatively and the importance of attending the After Breast Cancer class following surgery.  Patient was educated today on lymphedema risk reduction practices as it pertains to recommendations that will benefit the patient immediately following surgery.  She verbalized good understanding.  Problem List Patient Active Problem List   Diagnosis Date Noted  . Malignant neoplasm of upper-outer quadrant of left breast in female, estrogen receptor positive ([place]) 04/30/2019   Annia Friendly, PT 05/05/19 2:55 PM  [place], [place], [address] Phone: [phone]   Fax:  [phone]  Name: Victoria Barker MRN: [id number] Date of Birth: 18-May-1981

## 2019-05-05 NOTE — Progress Notes (Signed)
Radiation Oncology         [phone] ________________________________  Initial outpatient Consultation  Name: Victoria Barker MRN: [id number]  Date: 05/05/2019  DOB: [birth year]  OF:BPZWCHEN, Victoria Main, MD  Victoria Mesa, MD   REFERRING PHYSICIAN: Donnie Mesa, MD  DIAGNOSIS:    ICD-10-CM   1. Malignant neoplasm of upper-outer quadrant of left breast in female, estrogen receptor positive ([place])  C50.412    Z17.0      Cancer Staging Malignant neoplasm of upper-outer quadrant of left breast in female, estrogen receptor positive ([place]) Staging form: Breast, AJCC 8th Edition - Clinical stage from 04/28/2019: Stage IIA (cT2, cN0, cM0, G2, ER+, PR-, HER2: Equivocal) - Signed by Truitt Merle, MD on 05/04/2019   CHIEF COMPLAINT: Here to discuss management of left breast cancer  HISTORY OF PRESENT ILLNESS::Victoria Barker is a 38 y.o. female who presented with a palpable left breast mass.   Ultrasound of breast on 04/21/2019 revealed: 2.4 cm left breast mass at 1 o'clock; calcifications in the upper-outer left breast; single left axillary lymph node with mild cortical thickening.   Biopsy on date of 04/28/2019 showed invasive ductal carcinoma with DCIS.  ER status: positive; PR status negative, Her2 status equivocal; Grade 2. FISH pending. Biopsied lymph node was negative.  She is in her usual state of health.  She is a grandmother of an infant less than 5 year old.  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  She is here with a friend today.  PREVIOUS RADIATION THERAPY: No  PAST MEDICAL HISTORY:  has no past medical history on file.    PAST SURGICAL HISTORY:No past surgical history on file.  FAMILY HISTORY: family history includes Hypertension in her maternal grandmother and mother.  SOCIAL HISTORY:  reports that she has quit smoking. She has a 1.25 pack-year smoking history. She uses smokeless tobacco. She reports previous alcohol use. She reports  current drug use. Drug: Marijuana.  ALLERGIES: Patient has no known allergies.  MEDICATIONS:  Current Outpatient Medications  Medication Sig Dispense Refill   dexlansoprazole (DEXILANT) 60 MG capsule Take 60 mg by mouth daily.     Lactobacillus-Inulin (Zavalla PO) Take by mouth.     MULTIPLE VITAMIN PO Take by mouth.     No current facility-administered medications for this encounter.     REVIEW OF SYSTEMS: As above   PHYSICAL EXAM:   Vitals:   05/05/19 1235  BP: 112/83  Pulse: 89  Resp: 18  Temp: 98.7 F (37.1 C)  SpO2: 100%   Filed Weights   05/05/19 1235  Weight: 117 lb 11.2 oz (53.4 kg)   General: Alert and oriented, in no acute distress HEENT: Head is normocephalic. Psychiatric: Judgment and insight are intact. Affect is appropriate. Breasts: approx 5cm mass (some thickening could be due to biopsy change), UOQ, left breast . No other palpable masses appreciated in the breasts or axillae bilaterally.   ECOG = 0  0 - Asymptomatic (Fully active, able to carry on all predisease activities without restriction)  1 - Symptomatic but completely ambulatory (Restricted in physically strenuous activity but ambulatory and able to carry out work of a light or sedentary nature. For example, light housework, office work)  2 - Symptomatic, <50% in bed during the day (Ambulatory and capable of all self care but unable to carry out any work activities. Up and about more than 50% of waking hours)  3 - Symptomatic, >50% in bed, but not  bedbound (Capable of only limited self-care, confined to bed or chair 50% or more of waking hours)  4 - Bedbound (Completely disabled. Cannot carry on any self-care. Totally confined to bed or chair)  5 - Death   Victoria Barker MM, Creech RH, Tormey DC, et al. [phone]). "Toxicity and response criteria of the [facility]". [place] Oncol. 5 (6): 649-55   LABORATORY DATA:  Lab Results  Component Value Date    WBC 5.8 05/05/2019   HGB 12.9 05/05/2019   HCT 37.9 05/05/2019   MCV 87.7 05/05/2019   PLT 205 05/05/2019   CMP     Component Value Date/Time   NA 138 05/05/2019 1159   K 3.7 05/05/2019 1159   CL 103 05/05/2019 1159   CO2 28 05/05/2019 1159   GLUCOSE 152 (H) 05/05/2019 1159   BUN 11 05/05/2019 1159   CREATININE 0.85 05/05/2019 1159   CALCIUM 9.1 05/05/2019 1159   PROT 6.9 05/05/2019 1159   ALBUMIN 4.0 05/05/2019 1159   AST 15 05/05/2019 1159   ALT 9 05/05/2019 1159   ALKPHOS 52 05/05/2019 1159   BILITOT 0.5 05/05/2019 1159   GFRNONAA >60 05/05/2019 1159   GFRAA >60 05/05/2019 1159         RADIOGRAPHY: [place] Axillary Node Core Biopsy Left  Addendum Date: 04/29/2019   ADDENDUM REPORT: 04/29/2019 15:41 ADDENDUM: Pathology revealed 1- GRADE II INVASIVE DUCTAL CARCINOMA of the LEFT breast, 1 o'clock position. SEE COMMENT. This was found to be concordant by Dr. Lajean Manes. Pathology revealed 2- THERE IS NO EVIDENCE OF CARCINOMA IN 1 OF 1 LYMPH NODE (0/1) of the LEFT axilla. This was found to be concordant by Dr. Lajean Manes. 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 SEE COMMENT. This was found to be concordant by Dr. Lajean Manes. Microscopic Comment: 1. The carcinoma appears grade II. A breast prognostic profile will be performed and the results reported separately. 3. The ductal carcinoma in situ is very focal. Estrogen receptor and progesterone receptor studies will be attempted on part 3 and the results reported separately. Dr. Carollee Barker of [place] discussed Pathology results by telephone with her patient. The patient reported doing well after the biopsies with tenderness at the sites. Post biopsy instructions and care were reviewed and questions were answered. The patient was encouraged to call [facility] for any additional concerns. Per patient request, a  referral was made to [facility] on May 05, 2019. Pathology results reported by Stacie Acres, RN on 04/29/2019. Electronically Signed   By: Lajean Manes M.D.   On: 04/29/2019 15:41   Result Date: 04/29/2019 CLINICAL DATA:  Patient presents for ultrasound-guided core needle biopsy of a spiculated upper-outer quadrant left breast mass as well as a borderline abnormal left axillary lymph node. She is also scheduled to undergo stereotactic biopsy of calcifications extending anterior from the left breast mass. EXAM: ULTRASOUND GUIDED LEFT BREAST CORE NEEDLE BIOPSY ULTRASOUND GUIDED LEFT AXILLARY LYMPH NODE CORE NEEDLE BIOPSY COMPARISON:  Previous exam(s). FINDINGS: I met with the patient and we discussed the procedure of ultrasound-guided biopsy, including benefits and alternatives. We discussed the high likelihood of a successful procedure. We discussed the risks of the procedure, including infection, bleeding, tissue injury, clip migration, and inadequate sampling. Informed written consent was given. The usual time-out protocol was performed immediately prior to  the procedure. Lesion #1: Breast Mass. Lesion quadrant: Upper outer quadrant Using sterile technique and 1% Lidocaine as local anesthetic, under direct ultrasound visualization, a 12 gauge spring-loaded device was used to perform biopsy of the 1 o'clock position spiculated left breast mass using a inferolateral approach. At the conclusion of the procedure ribbon shaped tissue marker clip was deployed into the biopsy cavity. Lesion #2: Axillary Lymph Node. Lesion location: Left axilla. Using sterile technique and 1% Lidocaine as local anesthetic, under direct ultrasound visualization, a 14 gauge spring-loaded device was used to perform biopsy of the borderline abnormal left axillary lymph node using a inferolateral approach. At the conclusion of the procedure Q shaped,HydroMARK  tissue marker clip was deployed into the biopsy cavity. Follow up 2 view mammogram was performed and dictated separately. IMPRESSION: Ultrasound guided biopsy of a left breast mass and a borderline abnormal left axillary lymph node. No apparent complications. Electronically Signed: By: Lajean Manes M.D. On: 04/28/2019 08:25   Mm Clip Placement Left  Result Date: 04/28/2019 CLINICAL DATA:  Post biopsy marker clip placement evaluation. Patient underwent ultrasound-guided core needle biopsy of a left breast mass, a borderline abnormal left axillary lymph node and stereotactic core needle biopsy of calcifications. EXAM: DIAGNOSTIC LEFT MAMMOGRAM POST ULTRASOUND AND STEREOTACTIC BIOPSY COMPARISON:  Previous exam(s). FINDINGS: Mammographic images were obtained following ultrasound-guided and stereotactic guided biopsy of a left breast mass, borderline abnormal left axillary lymph node and calcifications extending linearly anteriorly from the left breast mass. The ribbon shaped biopsy clip lies within the posterosuperior margin of the left breast mass, the coil shaped biopsy clip lies approximately 5 mm superior and lateral to the residual linear left breast calcifications, 4.3 cm anterior to the ribbon clip. The Q shaped is partly imaged in the left axilla. IMPRESSION: Appropriate positioning of the ribbon shaped, coil shaped and Q shaped biopsy marking clips at the site of biopsy in the left breast associated with the mass, anterior upper outer quadrant along the linear calcifications and in the left axilla. Final Assessment: Post Procedure Mammograms for Marker Placement Electronically Signed   By: Lajean Manes M.D.   On: 04/28/2019 09:09   Mm Lt Breast Bx W Loc Dev 1st Lesion Image Bx Spec Stereo Guide  Addendum Date: 04/29/2019   ADDENDUM REPORT: 04/29/2019 15:43 ADDENDUM: Pathology revealed 1- GRADE II INVASIVE DUCTAL CARCINOMA of the LEFT breast, 1 o'clock position. SEE COMMENT. This was found to be  concordant by Dr. Lajean Manes. Pathology revealed 2- THERE IS NO EVIDENCE OF CARCINOMA IN 1 OF 1 LYMPH NODE (0/1) of the LEFT axilla. This was found to be concordant by Dr. Lajean Manes. 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 SEE COMMENT. This was found to be concordant by Dr. Lajean Manes. Microscopic Comment: 1. The carcinoma appears grade II. A breast prognostic profile will be performed and the results reported separately. 3. The ductal carcinoma in situ is very focal. Estrogen receptor and progesterone receptor studies will be attempted on part 3 and the results reported separately. Dr. Carollee Barker of [place] discussed Pathology results by telephone with her patient. The patient reported doing well after the biopsies with tenderness at the sites. Post biopsy instructions and care were reviewed and questions were answered. The patient was encouraged to call [facility] for any additional concerns. Per patient request, a referral was made to [facility]  [place] on May 05, 2019. Pathology results reported by Stacie Acres, RN on 04/29/2019. Electronically Signed   By: Lajean Manes M.D.   On: 04/29/2019 15:43   Result Date: 04/29/2019 CLINICAL DATA:  Patient presents for stereotactic core needle biopsy of calcifications that extend anterior to a highly suspicious left breast mass, biopsy earlier today under ultrasound guidance. EXAM: LEFT BREAST STEREOTACTIC CORE NEEDLE BIOPSY COMPARISON:  Previous exams. FINDINGS: The patient and I discussed the procedure of stereotactic-guided biopsy including benefits and alternatives. We discussed the high likelihood of a successful procedure. We discussed the risks of the procedure including infection, bleeding, tissue injury, clip migration, and inadequate sampling. Informed  written consent was given. The usual time out protocol was performed immediately prior to the procedure. Using sterile technique and 1% Lidocaine as local anesthetic, under stereotactic guidance, a 9 gauge vacuum assisted device was used to perform core needle biopsy of calcifications in the upper outer quadrant of the left breast using a superior approach. Specimen radiograph was performed showing calcifications for which biopsy were performed. Specimens with calcifications are identified for pathology. Lesion quadrant: Upper outer quadrant At the conclusion of the procedure, coil shaped tissue marker clip was deployed into the biopsy cavity. Follow-up 2-view mammogram was performed and dictated separately. IMPRESSION: Stereotactic-guided biopsy of left breast calcifications. No apparent complications. Electronically Signed: By: Lajean Manes M.D. On: 04/28/2019 09:00   [place] Lt Breast Bx W Loc Dev 1st Lesion Img Bx Spec US Guide  Addendum Date: 04/29/2019   ADDENDUM REPORT: 04/29/2019 15:41 ADDENDUM: Pathology revealed 1- GRADE II INVASIVE DUCTAL CARCINOMA of the LEFT breast, 1 o'clock position. SEE COMMENT. This was found to be concordant by Dr. Lajean Manes. Pathology revealed 2- THERE IS NO EVIDENCE OF CARCINOMA IN 1 OF 1 LYMPH NODE (0/1) of the LEFT axilla. This was found to be concordant by Dr. Lajean Manes. 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 SEE COMMENT. This was found to be concordant by Dr. Lajean Manes. Microscopic Comment: 1. The carcinoma appears grade II. A breast prognostic profile will be performed and the results reported separately. 3. The ductal carcinoma in situ is very focal. Estrogen receptor and progesterone receptor studies will be attempted on part 3 and the results reported separately. Dr. Carollee Barker of [facility] discussed Pathology results by telephone with her patient. The patient  reported doing well after the biopsies with tenderness at the sites. Post biopsy instructions and care were reviewed and questions were answered. The patient was encouraged to call [facility] for any additional concerns. Per patient request, a referral was made to [facility] on May 05, 2019. Pathology results reported by Stacie Acres, RN on 04/29/2019. Electronically Signed   By: Lajean Manes M.D.   On: 04/29/2019 15:41   Result Date: 04/29/2019 CLINICAL DATA:  Patient presents for ultrasound-guided core needle biopsy of a spiculated upper-outer quadrant left breast mass as well as a borderline abnormal left axillary lymph node. She is also scheduled to undergo stereotactic biopsy of calcifications extending anterior from the left breast mass. EXAM: ULTRASOUND GUIDED LEFT BREAST CORE NEEDLE BIOPSY ULTRASOUND GUIDED LEFT AXILLARY LYMPH NODE CORE NEEDLE BIOPSY COMPARISON:  Previous exam(s). FINDINGS: I met with the patient and we discussed the procedure of ultrasound-guided biopsy, including benefits and alternatives. We discussed the high likelihood of a  successful procedure. We discussed the risks of the procedure, including infection, bleeding, tissue injury, clip migration, and inadequate sampling. Informed written consent was given. The usual time-out protocol was performed immediately prior to the procedure. Lesion #1: Breast Mass. Lesion quadrant: Upper outer quadrant Using sterile technique and 1% Lidocaine as local anesthetic, under direct ultrasound visualization, a 12 gauge spring-loaded device was used to perform biopsy of the 1 o'clock position spiculated left breast mass using a inferolateral approach. At the conclusion of the procedure ribbon shaped tissue marker clip was deployed into the biopsy cavity. Lesion #2: Axillary Lymph Node. Lesion location: Left axilla. Using sterile technique and  1% Lidocaine as local anesthetic, under direct ultrasound visualization, a 14 gauge spring-loaded device was used to perform biopsy of the borderline abnormal left axillary lymph node using a inferolateral approach. At the conclusion of the procedure Q shaped,HydroMARK tissue marker clip was deployed into the biopsy cavity. Follow up 2 view mammogram was performed and dictated separately. IMPRESSION: Ultrasound guided biopsy of a left breast mass and a borderline abnormal left axillary lymph node. No apparent complications. Electronically Signed: By: Lajean Manes M.D. On: 04/28/2019 08:25      IMPRESSION/PLAN: Left breast cancer - MRI of breasts pending for further workup  We are hopeful she will be a candidate for breast conserving surgery.  It was a pleasure meeting the patient today. We discussed the risks, benefits, and side effects of radiotherapy. I recommend radiotherapy to the left breast +/- nodes to reduce her risk of locoregional recurrence by 2/3.  We discussed that radiation would take approximately 6 weeks to complete and that I would give the patient a few weeks to heal following surgery before starting treatment planning.  If chemotherapy were to be given, this would precede radiotherapy. We spoke about acute effects including skin irritation and fatigue as well as much less common late effects including internal organ injury or irritation. We spoke about the latest technology that is used to minimize the risk of late effects for patients undergoing radiotherapy to the breast or chest wall. No guarantees of treatment were given. The patient is enthusiastic about proceeding with treatment. I look forward to participating in the patient's care.  I will await her referral back to me for postoperative follow-up and eventual CT simulation/treatment planning.  I spent  Over 20 minutes face to face with the patient and more than 50% of that time was spent in counseling and/or coordination of  care.   __________________________________________   Eppie Gibson, MD   This document serves as a record of services personally performed by Eppie Gibson, MD. It was created on her behalf by Wilburn Mylar, a trained medical scribe. The creation of this record is based on the scribe's personal observations and the provider's statements to them. This document has been checked and approved by the attending provider.

## 2019-05-05 NOTE — Patient Instructions (Signed)

## 2019-05-05 NOTE — Progress Notes (Signed)
REFERRING PROVIDER: Truitt Merle, MD [address] [place],  [place] [address]  PRIMARY PROVIDER:  [name], MD  PRIMARY REASON FOR VISIT:  1. Malignant neoplasm of upper-outer quadrant of left breast in female, estrogen receptor positive ([place])     I connected with Ms. Kalb on 05/05/2019 at 2:55 PM EDT by Webex and verified that I am speaking with the correct person using two identifiers.    Patient location: [facility] Provider location: office  HISTORY OF PRESENT ILLNESS:   Ms. Tulloch, a 38 y.o. female, was seen for a [facility] cancer genetics consultation at the request of Dr. Burr Medico due to her recent diagnosis of breast cancer. Ms. Orbach presents to clinic today to discuss the possibility of a hereditary predisposition to cancer, genetic testing, and to further clarify her future cancer risks, as well as potential cancer risks for family members.   In 2020, at the age of 50, Ms. 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   CANCER HISTORY:  Oncology History  Malignant neoplasm of upper-outer quadrant of left breast in female, estrogen receptor positive ([place])  04/21/2019 Mammogram   Diagnostic mammogram 04/21/19  IMPRESSION 1. findings highly suspicious for left breast cancer. There is a palpable irregular mass 4cm from nipple measuring 2.4x2.4x1.8cm in the 1:00 position axis of the left breast and pleomorphic  calcifications in the upper-outer quadrant as described above.  2. single left 0.9x0.5cm axillary lymph node with a mild/borderline thickened cortex.    04/28/2019 Cancer Staging   Staging form: Breast, AJCC 8th Edition - Clinical stage from 04/28/2019: Stage IIA (cT2, cN0, cM0, G2, ER+, PR-, HER2: Equivocal) - Signed by Truitt Merle, MD on 05/04/2019   04/28/2019 Initial Biopsy   Diagnosis  04/28/19 1. Breast, left, needle core biopsy, 1 o'clock position - INVASIVE DUCTAL CARCINOMA. - DUCTAL CARCINOMA IN SITU. - SEE COMMENT. 2. Lymph node, needle/core biopsy, left axilla - THERE IS NO EVIDENCE OF CARCINOMA IN 1 OF 1 LYMPH NODE (0/1). 3. Breast, left, needle core biopsy, upper, outer quadrant - DUCTAL CARCINOMA IN SITU WITH CALCIFICATIONS. - SEE COMMENT.   04/28/2019 Receptors her2   1. PROGNOSTIC INDICATORS Results: HER2 negative  Estrogen Receptor: 80%, POSITIVE, STRONG STAINING INTENSITY Progesterone Receptor: 0%, NEGATIVE Proliferation Marker Ki67: 35%    04/30/2019 Initial Diagnosis   Malignant neoplasm of upper-outer quadrant of left breast in female, estrogen receptor positive ([place])      RISK FACTORS:  Menarche was at age 32.  First live birth at age 30.  OCP use for approximately 2 years.  Ovaries intact: yes.  Hysterectomy: no.  Menopausal status: premenopausal.  HRT use: 0 years. Colonoscopy: no; not examined. Mammogram within the last year: yes. Number of breast biopsies: 1. Up to date with pelvic exams: yes. Any excessive radiation exposure in the past: no  No past medical history on file.  No past surgical history on file.  Social History   Socioeconomic History  . Marital status: Single    Spouse name: Not on file  . Number of children: 2  . Years of education: Not on file  . Highest education level: Not on file  Occupational History  . Occupation: [employer]   Social Needs  . Financial resource strain: Not on file  . Food insecurity    Worry: Not on file    Inability: Not on  file  . Transportation needs    Medical: Not on file    Non-medical: Not on file  Tobacco Use  . Smoking status: Former Smoker    Packs/day: 0.25    Years: 5.00    Pack years: 1.25  . Smokeless tobacco: Current User  Substance and Sexual Activity  . Alcohol use: Not Currently  . Drug use: Yes    Types: Marijuana  . Sexual activity: Not on file  Lifestyle   . Physical activity    Days per week: Not on file    Minutes per session: Not on file  . Stress: Not on file  Relationships  . Social Herbalist on phone: Not on file    Gets together: Not on file    Attends religious service: Not on file    Active member of club or organization: Not on file    Attends meetings of clubs or organizations: Not on file    Relationship status: Not on file  Other Topics Concern  . Not on file  Social History Narrative  . Not on file     FAMILY HISTORY:  We obtained a detailed, 4-generation family history.  Significant diagnoses are listed below: Family History  Problem Relation Age of Onset  . Hypertension Mother   . Hypertension Maternal Grandmother    Ms. Kuehne has a daughter, 5, and a granddaughter 35 mos old. She has a son, 98. Ms. Fallas also has a maternal half brother who is 4, no cancer diagnoses.  Ms. Dyk mother is living at 28 with no history of cancer. Patient has 3 maternal aunts and 2 maternal uncles, no known cancer diagnoses. No cancers in maternal cousins. Maternal grandmother died in her 44s, grandfather died in his 64s. She reports that her grandfather's sister possibly had breast cancer.   Ms. Spikes father is living at 59 with no history of cancer. Patient had 1 paternal aunt, no cancer diagnoses, she is deceased. No known cancers in paternal cousin. Paternal grandparents were decased before patient was born so she is unsure their ages of death.  Ms. Tessmer is unaware of previous family history of genetic testing for hereditary cancer risks.There is no reported Ashkenazi Jewish ancestry. There is no known consanguinity.  GENETIC COUNSELING ASSESSMENT: Ms. Majid is a 38 y.o. female with a personal history of breast cancer which is somewhat suggestive of a hereditary cancer syndrome and predisposition to cancer. We, therefore, discussed and recommended the following at today's visit.   DISCUSSION: We  discussed that 5 - 10% of breast cancer is hereditary, with most cases associated with BRCA1/BRCA2 mutations.  There are other genes that can be associated with hereditary breast cancer syndromes, and other cancers associated with these syndromes.  We discussed that testing is beneficial for several reasons including surgical decision-making for breast cancer, knowing how to follow individuals after completing their treatment, and understand if other family members could be at risk for cancer and allow them to undergo genetic testing.   We reviewed the characteristics, features and inheritance patterns of hereditary cancer syndromes. We also discussed genetic testing, including the appropriate family members to test, the process of testing, insurance coverage and turn-around-time for results. We discussed the implications of a negative, positive and/or variant of uncertain significant result. In order to get genetic test results in a timely manner so that Ms. Bradfield can use these genetic test results for surgical decisions, we recommended Ms. Gebhard pursue genetic testing for the  [facility] Breast Cancer STAT Panel. Once complete, we recommend Ms. Drees pursue reflex genetic testing to the Common Hereditary Cancers gene panel.   The STAT Breast cancer panel offered by [facility] includes sequencing and rearrangement analysis for the following 9 genes:  ATM, BRCA1, BRCA2, CDH1, CHEK2, PALB2, PTEN, STK11 and TP53.    The Common Hereditary Cancers Panel offered by [facility] includes sequencing and/or deletion duplication testing of the following 48 genes: APC, ATM, AXIN2, BARD1, BMPR1A, BRCA1, BRCA2, BRIP1, CDH1, CDKN2A (p14ARF), CDKN2A (p16INK4a), CKD4, CHEK2, CTNNA1, DICER1, EPCAM (Deletion/duplication testing only), GREM1 (promoter region deletion/duplication testing only), KIT, MEN1, MLH1, MSH2, MSH3, MSH6, MUTYH, NBN, NF1, NHTL1, PALB2, PDGFRA, PMS2, POLD1, POLE, PTEN, RAD50, RAD51C, RAD51D, RNF43, SDHB,  SDHC, SDHD, SMAD4, SMARCA4. STK11, TP53, TSC1, TSC2, and VHL.  The following genes were evaluated for sequence changes only: SDHA and HOXB13 c.251G>A variant only.  Based on Ms. Hiltner's personal history of cancer, she meets medical criteria for genetic testing. Despite that she meets criteria, she may still have an out of pocket cost.   PLAN: After considering the risks, benefits, and limitations, Ms. Linenberger provided informed consent to pursue genetic testing and the blood sample was sent to [facility] for analysis of the Breast Cancer STAT Panel + Common Hereditary Cancers Panel. Results should be available within approximately 1 weeks' time, at which point they will be disclosed by telephone to Ms. Bily, as will any additional recommendations warranted by these results. Ms. Kemler will receive a summary of her genetic counseling visit and a copy of her results once available. This information will also be available in Epic.   Ms. Hainsworth questions were answered to her satisfaction today. Our contact information was provided should additional questions or concerns arise. Thank you for the referral and allowing [place] to share in the care of your patient.   Faith Rogue, MS, [facility] Genetic Counselor [email or web address] Phone: [phone]  The patient was seen for a total of 15 minutes in face-to-face genetic counseling.  Patient's friend was also present. Drs. Magrinat, Lindi Adie and/or Burr Medico were available for discussion regarding this case.   _______________________________________________________________________ For Office Staff:  Number of people involved in session: 2 Was an Intern/ student involved with case: no

## 2019-05-06 ENCOUNTER — Telehealth: Payer: Self-pay | Admitting: Hematology

## 2019-05-06 NOTE — Telephone Encounter (Signed)
No los per 11/11. °

## 2019-05-07 ENCOUNTER — Encounter: Payer: Self-pay | Admitting: Radiation Oncology

## 2019-05-10 ENCOUNTER — Encounter: Payer: Self-pay | Admitting: *Deleted

## 2019-05-10 ENCOUNTER — Telehealth: Payer: Self-pay | Admitting: *Deleted

## 2019-05-10 NOTE — Telephone Encounter (Signed)
Left vm regarding [facility] from 05/05/19. Contact information provided for questions or needs as well as informed pt referrals will be made to [facility] and SW for travel assistance.

## 2019-05-10 NOTE — Progress Notes (Signed)
[place] Work  Clinical Social Work received a referral for transportation and financial concerns.  CSW contacted patient at home to offer support and assess for needs.  Patient stated she was currently driving herself to appointments from her home in [place], [place].  Patient stated she planned to have someone drive her to her appointments when she started daily radiation treatment.   Patient is concerned about transportation expenses and increased financial stress.  CSW and patient discussed available resources and grants that would be available once she started treatment at [facility].  CSW and patient also discussed additional resources outside of [facility].  CSW emailed patient information on [place], [place] [facility] [place], and [employer].  Patient plans to review and contact CSW with questions or concerns.        Johnnye Lana, MSW, LCSW, OSW-C Clinical Social Worker [facility] [phone]

## 2019-05-11 ENCOUNTER — Encounter: Payer: Self-pay | Admitting: General Practice

## 2019-05-11 ENCOUNTER — Ambulatory Visit (HOSPITAL_COMMUNITY)
Admission: RE | Admit: 2019-05-11 | Discharge: 2019-05-11 | Disposition: A | Discharge status: Home / Self Care | Payer: BC Managed Care – PPO | Source: Ambulatory Visit | Attending: Surgery | Admitting: Surgery

## 2019-05-11 ENCOUNTER — Telehealth: Payer: Self-pay

## 2019-05-11 ENCOUNTER — Other Ambulatory Visit: Payer: Self-pay

## 2019-05-11 DIAGNOSIS — Z170 Estrogen receptor positive status [ER+]: Secondary | ICD-10-CM | POA: Insufficient documentation

## 2019-05-11 DIAGNOSIS — C50412 Malignant neoplasm of upper-outer quadrant of left female breast: Secondary | ICD-10-CM | POA: Insufficient documentation

## 2019-05-11 MED ORDER — GADOBUTROL 1 MMOL/ML IV SOLN
5.0000 mL | Freq: Once | INTRAVENOUS | Status: AC | PRN
  Administered 2019-05-11: 5 mL via INTRAVENOUS

## 2019-05-11 NOTE — Telephone Encounter (Signed)
Nutrition Assessment  Reason for Assessment:  Pt attended Breast Clinic on 05/05/2019 and was given nutrition packet by nurse navigator  ASSESSMENT:  38 year old female with new diagnosis of left breast cancer. Planning MRI, likely lumpectomy, mammaprint/oncotype, radiation and antiestrogens   Spoke with patient via phone to introduce self and service at [facility].  Patient unable to speak with RD very long as was at work.   Medications:  reviewed  Labs: reviewed  Anthropometrics:   Height: 62 inches Weight: 117 lb BMI: 21   NUTRITION DIAGNOSIS: Food and nutrition related knowledge deficit related to new diagnosis of breast cancer as evidenced by no prior need for nutrition related information.  INTERVENTION:   Discussed briefly packet of information regarding nutritional tips for breast cancer patients.  No questions at this time. Contact information provided and patient knows to contact me with questions/concerns.    MONITORING, EVALUATION, and GOAL: Pt will consume a healthy plant based diet to maintain lean body mass throughout treatment.   Victoria Barker B. Zenia Resides, [place], [place] Registered Dietitian [phone] (pager)

## 2019-05-11 NOTE — Progress Notes (Signed)
[place] CSW Progress Notes  Call to patient, informed her about [place] [phone]) which does reimburse for oncology related travel expenses for local residents. Patient states she will go to agency and sign up to receive available assistance.  Edwyna Shell, LCSW Clinical Social Worker Phone:  [phone] .

## 2019-05-19 ENCOUNTER — Telehealth: Payer: Self-pay | Admitting: Licensed Clinical Social Worker

## 2019-05-19 NOTE — Telephone Encounter (Signed)
Revealed negative genetic testing on the [facility] Breast Cancer STAT Panel. We are still waiting on the rest of the testing to come back.We will call once the remainder of the results are ready.

## 2019-05-24 ENCOUNTER — Ambulatory Visit: Payer: Self-pay | Admitting: Surgery

## 2019-05-24 ENCOUNTER — Encounter: Payer: Self-pay | Admitting: *Deleted

## 2019-05-24 DIAGNOSIS — C50912 Malignant neoplasm of unspecified site of left female breast: Secondary | ICD-10-CM

## 2019-05-26 ENCOUNTER — Other Ambulatory Visit: Payer: Self-pay | Admitting: Hematology

## 2019-05-26 ENCOUNTER — Telehealth: Payer: Self-pay | Admitting: Hematology

## 2019-05-26 MED ORDER — TAMOXIFEN CITRATE 20 MG PO TABS: 20.0000 mg | ORAL_TABLET | Freq: Every day | ORAL | 2 refills | Status: DC

## 2019-05-26 NOTE — Telephone Encounter (Signed)
Dr. Georgette Dover informed me that pt has decided to have mastectomy and reconstruction, she will not have surgery until next month. I called pt and discussed starting her on Tamoxifen while she is waiting for surgery.  The potential benefit and side effects of tamoxifen were discussed with her in detail, she voiced good understanding, and agrees to proceed.  I called in tamoxifen 20 mg daily to her pharmacy [facility] in [place].  She will start this week.  I recommend her to stop 2 weeks before surgery, due to the slightly increased risk of thrombosis after surgery.  She will call me as soon as she knows the date of surgery.  She knows to call me if she experience significant side effects from tamoxifen.  Truitt Merle  05/26/2019

## 2019-05-27 ENCOUNTER — Telehealth: Payer: Self-pay | Admitting: *Deleted

## 2019-05-27 ENCOUNTER — Encounter: Payer: Self-pay | Admitting: *Deleted

## 2019-05-27 NOTE — Telephone Encounter (Signed)
Received call from pt aunt Ms Gorden Harms, verified on [facility] form. Discussed treatment care plan and mastectomy and reconstruction as well as oncotype and AI.

## 2019-05-28 ENCOUNTER — Telehealth: Payer: Self-pay | Admitting: Licensed Clinical Social Worker

## 2019-05-28 ENCOUNTER — Ambulatory Visit: Payer: Self-pay | Admitting: Licensed Clinical Social Worker

## 2019-05-28 ENCOUNTER — Encounter: Payer: Self-pay | Admitting: Licensed Clinical Social Worker

## 2019-05-28 DIAGNOSIS — Z170 Estrogen receptor positive status [ER+]: Secondary | ICD-10-CM

## 2019-05-28 DIAGNOSIS — Z1379 Encounter for other screening for genetic and chromosomal anomalies: Secondary | ICD-10-CM | POA: Insufficient documentation

## 2019-05-28 DIAGNOSIS — C50412 Malignant neoplasm of upper-outer quadrant of left female breast: Secondary | ICD-10-CM

## 2019-05-28 NOTE — Progress Notes (Signed)
HPI:  Ms. Freeburg was previously seen in the [place] clinic due to a personal history of cancer and concerns regarding a hereditary predisposition to cancer. Please refer to our prior cancer genetics clinic note for more information regarding our discussion, assessment and recommendations, at the time. Ms. Pianka recent genetic test results were disclosed to her, as were recommendations warranted by these results. These results and recommendations are discussed in more detail below.  CANCER HISTORY:  Oncology History  Malignant neoplasm of upper-outer quadrant of left breast in female, estrogen receptor positive ([place])  04/21/2019 Mammogram   Diagnostic mammogram 04/21/19  IMPRESSION 1. findings highly suspicious for left breast cancer. There is a palpable irregular mass 4cm from nipple measuring 2.4x2.4x1.8cm in the 1:00 position axis of the left breast and pleomorphic  calcifications in the upper-outer quadrant as described above.  2. single left 0.9x0.5cm axillary lymph node with a mild/borderline thickened cortex.    04/28/2019 Cancer Staging   Staging form: Breast, AJCC 8th Edition - Clinical stage from 04/28/2019: Stage IIA (cT2, cN0, cM0, G2, ER+, PR-, HER2: Equivocal) - Signed by Truitt Merle, MD on 05/04/2019   04/28/2019 Initial Biopsy   Diagnosis 04/28/19 1. Breast, left, needle core biopsy, 1 o'clock position - INVASIVE DUCTAL CARCINOMA. - DUCTAL CARCINOMA IN SITU. - SEE COMMENT. 2. Lymph node, needle/core biopsy, left axilla - THERE IS NO EVIDENCE OF CARCINOMA IN 1 OF 1 LYMPH NODE (0/1). 3. Breast, left, needle core biopsy, upper, outer quadrant - DUCTAL CARCINOMA IN SITU WITH CALCIFICATIONS. - SEE COMMENT.   04/28/2019 Receptors her2   1. PROGNOSTIC INDICATORS Results: HER2 negative  Estrogen Receptor: 80%, POSITIVE, STRONG STAINING INTENSITY Progesterone Receptor: 0%, NEGATIVE Proliferation Marker Ki67: 35%    04/30/2019 Initial Diagnosis   Malignant neoplasm of upper-outer quadrant of left breast in female, estrogen receptor positive ([name])    Genetic Testing   No pathogenic variants identified. 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 The final report date is 05/28/2019.  The STAT Breast cancer panel offered by [facility] includes sequencing and rearrangement analysis for the following 9 genes:  ATM, BRCA1, BRCA2, CDH1, CHEK2, PALB2, PTEN, STK11 and TP53.    The Common Hereditary Cancers Panel offered by [facility] includes sequencing and/or deletion duplication testing of the following 48 genes: APC, ATM, AXIN2, BARD1, BMPR1A, BRCA1, BRCA2, BRIP1, CDH1, CDKN2A (p14ARF), CDKN2A (p16INK4a), CKD4, CHEK2, CTNNA1, DICER1, EPCAM (Deletion/duplication testing only), GREM1 (promoter region deletion/duplication testing only), KIT, MEN1, MLH1, MSH2, MSH3, MSH6, MUTYH, NBN, NF1, NHTL1, PALB2, PDGFRA, PMS2, POLD1, POLE, PTEN, RAD50, RAD51C, RAD51D, RNF43, SDHB, SDHC, SDHD, SMAD4, SMARCA4. STK11, TP53, TSC1, TSC2, and VHL.  The following genes were evaluated for sequence changes only: SDHA and HOXB13 c.251G>A variant only.     FAMILY HISTORY:  We obtained a detailed, 4-generation family history.  Significant diagnoses are listed below: Family History  Problem Relation Age of Onset   Hypertension Mother    Hypertension Maternal Grandmother    Ms. Loma has a daughter, 83, and a granddaughter 79 mos old. She has a son, 36. Ms. Lawrance also has a maternal half brother who is 53, no cancer diagnoses.  Ms. Durnell mother is living at 36 with no history of cancer. Patient has 3 maternal aunts and 2 maternal uncles, no known cancer diagnoses. No cancers in maternal cousins. Maternal grandmother died in her 51s, grandfather died in his 62s. She reports that her grandfather's sister possibly  had breast cancer.   Ms. Furuta father is living at 44 with no history of  cancer. Patient had 1 paternal aunt, no cancer diagnoses, she is deceased. No known cancers in paternal cousin. Paternal grandparents were decased before patient was born so she is unsure their ages of death.  Ms. Ambroise is unaware of previous family history of genetic testing for hereditary cancer risks.There is no reported Ashkenazi Jewish ancestry. There is no known consanguinity.  GENETIC TEST RESULTS: Genetic testing reported out on 05/28/2019 through the [facility] Common Hereditary cancer panel (STAT testing reported out on 05/18/2019) found no pathogenic mutations.  The STAT Breast cancer panel offered by [facility] includes sequencing and rearrangement analysis for the following 9 genes:  ATM, BRCA1, BRCA2, CDH1, CHEK2, PALB2, PTEN, STK11 and TP53.    The Common Hereditary Cancers Panel offered by [facility] includes sequencing and/or deletion duplication testing of the following 48 genes: APC, ATM, AXIN2, BARD1, BMPR1A, BRCA1, BRCA2, BRIP1, CDH1, CDKN2A (p14ARF), CDKN2A (p16INK4a), CKD4, CHEK2, CTNNA1, DICER1, EPCAM (Deletion/duplication testing only), GREM1 (promoter region deletion/duplication testing only), KIT, MEN1, MLH1, MSH2, MSH3, MSH6, MUTYH, NBN, NF1, NHTL1, PALB2, PDGFRA, PMS2, POLD1, POLE, PTEN, RAD50, RAD51C, RAD51D, RNF43, SDHB, SDHC, SDHD, SMAD4, SMARCA4. STK11, TP53, TSC1, TSC2, and VHL.  The following genes were evaluated for sequence changes only: SDHA and HOXB13 c.251G>A variant only.  The test report has been scanned into EPIC and is located under the Molecular Pathology section of the Results Review tab.  A portion of the result report is included below for reference.     We discussed with Ms. Severs that because current genetic testing is not perfect, it is possible there may be a gene mutation in one of these genes that current testing cannot detect, but that chance is small.  We also discussed, that there could be another gene that has not yet been discovered, or that we  have not yet tested, that is responsible for the cancer diagnoses in the family. It is also possible there is a hereditary cause for the cancer in the family that Ms. Heffner did not inherit and therefore was not identified in her testing.  Therefore, it is important to remain in touch with cancer genetics in the future so that we can continue to offer Ms. Previti the most up to date genetic testing.   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  At this time, it is unknown if this variant is associated with increased cancer risk or if this is a normal finding, but most variants such as this get reclassified to being inconsequential. It should not be used to make medical management decisions. With time, we suspect the lab will determine the significance of this variant, if any. If we do learn more about it, we will try to contact Ms. Applin to discuss it further. However, it is important to stay in touch with [place] periodically and keep the address and phone number up to date.  ADDITIONAL GENETIC TESTING: We discussed with Ms. Treto that her genetic testing was fairly extensive.  If there are genes identified to increase cancer risk that can be analyzed in the future, we would be happy to discuss and coordinate this testing at that time.    CANCER SCREENING RECOMMENDATIONS: Ms. Whisnant test result is considered negative (normal).  This means that we have not identified a hereditary cause for her personal and family history of cancer at this time. Most cancers  happen by chance and this negative test suggests that her cancer may fall into this category.    While reassuring, this does not definitively rule out a hereditary predisposition to cancer. It is still possible that there could be genetic mutations that are undetectable by current technology. There could be genetic mutations in genes that have not been tested or identified to increase  cancer risk.  Therefore, it is recommended she continue to follow the cancer management and screening guidelines provided by her oncology and primary healthcare provider.   An individual's cancer risk and medical management are not determined by genetic test results alone. Overall cancer risk assessment incorporates additional factors, including personal medical history, family history, and any available genetic information that may result in a personalized plan for cancer prevention and surveillance.  RECOMMENDATIONS FOR FAMILY MEMBERS:  Relatives in this family might be at some increased risk of developing cancer, over the general population risk, simply due to the family history of cancer.  We recommended female relatives in this family have a yearly mammogram beginning at age 60, or 3 years younger than the earliest onset of cancer, an annual clinical breast exam, and perform monthly breast self-exams. For example, her daughter should have mammogram starting at 56.  Female relatives in this family should also have a gynecological exam as recommended by their primary provider. All family members should have a colonoscopy by age 36, or as directed by their physicians.  FOLLOW-UP: Lastly, we discussed with Ms. Senna that cancer genetics is a rapidly advancing field and it is possible that new genetic tests will be appropriate for her and/or her family members in the future. We encouraged her to remain in contact with cancer genetics on an annual basis so we can update her personal and family histories and let her know of advances in cancer genetics that may benefit this family.   Our contact number was provided. Ms. Otte questions were answered to her satisfaction, and she knows she is welcome to call us at anytime with additional questions or concerns.   Faith Rogue, MS, [facility] Genetic Counselor [email or web address] Phone: [phone]

## 2019-05-28 NOTE — Telephone Encounter (Signed)
Revealed that remainder of genetic testing was negative. 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  We discussed that we do not know why she has breast cancer. It could be due to a different gene that we are not testing, or something our current technology cannot pick up.  It will be important for her to keep in contact with genetics to learn if additional testing may be needed in the future.

## 2019-06-03 ENCOUNTER — Telehealth: Payer: Self-pay | Admitting: *Deleted

## 2019-06-03 NOTE — Telephone Encounter (Signed)
Records mailed to patient per request; release QA:[id number]

## 2019-06-25 ENCOUNTER — Encounter: Payer: Self-pay | Admitting: Hematology

## 2019-06-30 NOTE — H&P (Signed)
Subjective:     Patient ID: Victoria Barker is a 38 y.o. female.  HPI  Here for follow up discussion breast reconstruction prior to planned left mastectomy. Presented with palpable left breast mass. Diagnositic MMG/US showed mass at 1 o clock, 4 cmfn, 2.4 x 2.4 x 1.8 cm and pleomorphic calcifications in the upper-outer quadrant. A single enlarged LN noted. 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 Biopsy LN benign.  MRI showed biopsy-proven site of invasive cancer 3.3 x 2.9 x 3.3 cm with surrounding NME overall area measures up to 6.0 x 3 x 5.3 cm. NME to the level of the pectoralis muscle. Biopsy clip in the UOQ at site of biopsy-proven DCIS is > 8 cm from clip to the posterior edge of the enhancing mass/NME. An additional 7 x 5 x 5 mm mass is identified in posterior, lateral left breast, abuting the pectoralis. No abnormal appearing lymph nodes. No additional biopsy completed as plan for mastectomy.   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 Plan Oncotype surgical specimen. Tamoxifen started as patient requested to not have surgery until after New Years.  Current 34 C Wt stable  Stopped vaping 3-4 weeks ago.  Works for [employer] in [place]. Lives with 2 kids, ages 19 and 12. Accompanied by boyfriend, reports multiple family members in area to assist with post op care.  Review of Systems Remainder 12 point review negative    Objective:   Physical Exam  Cardiovascular: Normal rate, regular rhythm and normal heart sounds.  Pulmonary/Chest: Effort normal and breath sounds normal.  Abdominal:  Paucity soft tissue for reconstruction  Lymphadenopathy:    She has no axillary adenopathy.  Skin:  Fitzpatrick 5    Grade 2 ptosis Palpable mass 12 o clock left breast SN to nipple R 23.5 L 24 BW R 15 L 16 cm (CW 12 cm) Nipple to IMF R 7 L 8 cm    Assessment:     Left breast ca UOQ ER+    Plan:     Congratulated her on being  off nicotine products. Needs to hold tamoxifen for 2 weeks prior to surgery.    Plan left nipple sparing mastectomy with immediate expander acellular dermis reconstruction.  Revieweddrains, OR length, hospital stay and recovery, limitations. Discussed process of expansion and implant based risks including rupture, imaging surveillance for silicone implants, infection requiring surgery or removal, contracture.   Discussed use of acellular dermis in reconstruction, cadaveric source, incorporation over several weeks, risk that if has seroma or infection can act as additional nidus for infection if not incorporated.  Discussed prepectoral vs sub pectoral reconstruction. Discussed with patient and benefit of this is no animation deformity, may be less pain. Risk may be more visible rippling over upper poles, greater need of ADM. Reviewed pre pectoral would require larger amount acellular dermis, more drains. Discussed any type reconstruction also risks long term displacement implant and visible rippling. If prepectoral counseled I would recommend she be comfortable with silicone implants as more options that have less rippling. She agrees to prepectoral placement.  Reviewed reconstruction will be asensate and not stimulate. Reviewed additional risks including but not limited to risks mastectomy flap necrosis requiring additional surgery, seroma, hematoma, asymmetry, need to additional procedures, fat necrosis, DVT/PE, damage to adjacent structures, cardiopulmonary complications.  Discussed risk COVID infectionthrough this elective surgery. Patient will receive COVID testing prior to surgery. Discussed even if patient receivesa negative test result, the   tests in some cases may fail to detect the virus or patient maycontract COVID after the test.COVID 19 infectionbefore/during/aftersurgery may result in lead to a higher chance of complication and death.   

## 2019-07-09 ENCOUNTER — Encounter (HOSPITAL_COMMUNITY)
Admission: RE | Admit: 2019-07-09 | Discharge: 2019-07-09 | Disposition: A | Discharge status: Home / Self Care | Payer: BC Managed Care – PPO | Source: Ambulatory Visit | Attending: Surgery | Admitting: Surgery

## 2019-07-09 ENCOUNTER — Other Ambulatory Visit (HOSPITAL_COMMUNITY)
Admission: RE | Admit: 2019-07-09 | Discharge: 2019-07-09 | Disposition: A | Discharge status: Home / Self Care | Payer: BC Managed Care – PPO | Source: Ambulatory Visit | Attending: Surgery | Admitting: Surgery

## 2019-07-09 ENCOUNTER — Encounter (HOSPITAL_COMMUNITY): Payer: Self-pay

## 2019-07-09 ENCOUNTER — Other Ambulatory Visit: Payer: Self-pay

## 2019-07-09 DIAGNOSIS — Z20822 Contact with and (suspected) exposure to covid-19: Secondary | ICD-10-CM | POA: Insufficient documentation

## 2019-07-09 DIAGNOSIS — Z01812 Encounter for preprocedural laboratory examination: Secondary | ICD-10-CM | POA: Insufficient documentation

## 2019-07-09 HISTORY — DX: Gastro-esophageal reflux disease without esophagitis: K21.9

## 2019-07-09 HISTORY — DX: Scoliosis, unspecified: M41.9

## 2019-07-09 HISTORY — DX: Sickle-cell trait: D57.3

## 2019-07-09 HISTORY — DX: Malignant (primary) neoplasm, unspecified: C80.1

## 2019-07-09 LAB — CBC
HCT: 40.7 % (ref 36.0–46.0)
Hemoglobin: 13.7 g/dL (ref 12.0–15.0)
MCH: 29.9 pg (ref 26.0–34.0)
MCHC: 33.7 g/dL (ref 30.0–36.0)
MCV: 88.9 fL (ref 80.0–100.0)
Platelets: 209 10*3/uL (ref 150–400)
RBC: 4.58 MIL/uL (ref 3.87–5.11)
RDW: 12.9 % (ref 11.5–15.5)
WBC: 7.2 10*3/uL (ref 4.0–10.5)
nRBC: 0 % (ref 0.0–0.2)

## 2019-07-09 LAB — BASIC METABOLIC PANEL
Anion gap: 8 (ref 5–15)
BUN: 10 mg/dL (ref 6–20)
CO2: 23 mmol/L (ref 22–32)
Calcium: 9.1 mg/dL (ref 8.9–10.3)
Chloride: 106 mmol/L (ref 98–111)
Creatinine, Ser: 0.85 mg/dL (ref 0.44–1.00)
GFR calc Af Amer: >60 mL/min (ref >60)
GFR calc non Af Amer: >60 mL/min (ref >60)
Glucose, Bld: 99 mg/dL (ref 70–99)
Potassium: 4.1 mmol/L (ref 3.5–5.1)
Sodium: 137 mmol/L (ref 135–145)

## 2019-07-09 NOTE — Progress Notes (Signed)
PCP/ GYN -  [name], MD Cardiologist - Denies  PPM/ICD - Denies  Chest x-ray - N/A EKG - N/A Stress Test - Denies ECHO - Denies Cardiac Cath - Denies  Sleep Study - Denies  Patient denies being a diabetic.  Blood Thinner Instructions: N/A Aspirin Instructions: N/A  ERAS Protcol - Yes PRE-SURGERY Ensure or G2- Pre-surgery ensure: 2 Pre-Surgery Ensures the night before surgery, one the morning of surgery.  COVID TEST- 07/09/2019  Anesthesia review: No  Patient denies shortness of breath, fever, cough and chest pain at PAT appointment  Coronavirus Screening  Have you experienced the following symptoms:  Cough yes/no: No Fever (>100.26F)  yes/no: No Runny nose yes/no: No Sore throat yes/no: No Difficulty breathing/shortness of breath  yes/no: No  Have you or a family member traveled in the last 14 days and where? yes/no: No   If the patient indicates "YES" to the above questions, their PAT will be rescheduled to limit the exposure to others and, the surgeon will be notified. THE PATIENT WILL NEED TO BE ASYMPTOMATIC FOR 14 DAYS.   If the patient is not experiencing any of these symptoms, the PAT nurse will instruct them to NOT bring anyone with them to their appointment since they may have these symptoms or traveled as well.   Please remind your patients and families that hospital visitation restrictions are in effect and the importance of the restrictions.    All instructions explained to the patient, with a verbal understanding of the material. Patient agrees to go over the instructions while at home for a better understanding. Patient also instructed to self quarantine after being tested for COVID-19. The opportunity to ask questions was provided.

## 2019-07-09 NOTE — Progress Notes (Signed)
[id number] [place], [address] [place] [address] Phone: [phone] Fax: [phone]      Your procedure is scheduled on January 19  Report to [facility] Main Entrance "A" at 0530 A.M., and check in at the Admitting office.  Call this number if you have problems the morning of surgery:  [phone]  Call [phone] if you have any questions prior to your surgery date Monday-Friday 8am-4pm    Remember:  Drink 2 Pre-Surgery Ensures the night before surgery after dinner   Do not eat  after midnight the night before your surgery  You may drink clear liquids until 0430 am the morning of your surgery.   Clear liquids allowed are: Water, Non-Citrus Juices (without pulp), Carbonated Beverages, Clear Tea, Black Coffee Only, and Gatorade  Please complete your last  PRE-SURGERY ENSURE that was provided to you by 0430 am the morning of surgery.  Please, if able, drink it in one setting. DO NOT SIP.     Take these medicines the morning of surgery with A SIP OF WATER  dexlansoprazole (DEXILANT)  tamoxifen (NOLVADEX)   7 days prior to surgery STOP taking any Aspirin (unless otherwise instructed by your surgeon), Aleve, Naproxen, Ibuprofen, Motrin, Advil, Goody's, BC's, all herbal medications, fish oil, and all vitamins.    The Morning of Surgery  Do not wear jewelry, make-up or nail polish.  Do not wear lotions, powders, or perfumes, or deodorant  Do not shave 48 hours prior to surgery.    Do not bring valuables to the hospital.  [facility] is not responsible for any belongings or valuables.  If you are a smoker, DO NOT Smoke 24 hours prior to surgery  If you wear a CPAP at night please bring your mask, tubing, and machine the morning of surgery   Remember that you must have someone to transport you home after your surgery, and remain with you for 24 hours if you are discharged the same day.   Please  bring cases for contacts, glasses, hearing aids, dentures or bridgework because it cannot be worn into surgery.    Leave your suitcase in the car.  After surgery it may be brought to your room.  For patients admitted to the hospital, discharge time will be determined by your treatment team.  Patients discharged the day of surgery will not be allowed to drive home.    Special instructions:   [place]- Preparing For Surgery  Before surgery, you can play an important role. Because skin is not sterile, your skin needs to be as free of germs as possible. You can reduce the number of germs on your skin by washing with CHG (chlorahexidine gluconate) Soap before surgery.  CHG is an antiseptic cleaner which kills germs and bonds with the skin to continue killing germs even after washing.    Oral Hygiene is also important to reduce your risk of infection.  Remember - BRUSH YOUR TEETH THE MORNING OF SURGERY WITH YOUR REGULAR TOOTHPASTE  Please do not use if you have an allergy to CHG or antibacterial soaps. If your skin becomes reddened/irritated stop using the CHG.  Do not shave (including legs and underarms) for at least 48 hours prior to first CHG shower. It is OK to shave your face.  Please follow these instructions carefully.   1. Shower the NIGHT BEFORE SURGERY and the MORNING OF SURGERY with CHG Soap.  2. If you chose to wash your hair, wash your hair first as usual with your normal shampoo.  3. After you shampoo, rinse your hair and body thoroughly to remove the shampoo.  4. Use CHG as you would any other liquid soap. You can apply CHG directly to the skin and wash gently with a scrungie or a clean washcloth.   5. Apply the CHG Soap to your body ONLY FROM THE NECK DOWN.  Do not use on open wounds or open sores. Avoid contact with your eyes, ears, mouth and genitals (private parts). Wash Face and genitals (private parts)  with your normal soap.   6. Wash thoroughly, paying special  attention to the area where your surgery will be performed.  7. Thoroughly rinse your body with warm water from the neck down.  8. DO NOT shower/wash with your normal soap after using and rinsing off the CHG Soap.  9. Pat yourself dry with a CLEAN TOWEL.  10. Wear CLEAN PAJAMAS to bed the night before surgery, wear comfortable clothes the morning of surgery  11. Place CLEAN SHEETS on your bed the night of your first shower and DO NOT SLEEP WITH PETS.    Day of Surgery:  Please shower the morning of surgery with the CHG soap Do not apply any deodorants/lotions. Please wear clean clothes to the hospital/surgery center.   Remember to brush your teeth WITH YOUR REGULAR TOOTHPASTE.   Please read over the following fact sheets that you were given.

## 2019-07-10 LAB — NOVEL CORONAVIRUS, NAA (HOSP ORDER, SEND-OUT TO REF LAB; TAT 18-24 HRS): SARS-CoV-2, NAA: NOT DETECTED

## 2019-07-12 NOTE — Anesthesia Preprocedure Evaluation (Addendum)
Anesthesia Evaluation  Patient identified by MRN, date of birth, ID band Patient awake    Reviewed: Allergy & Precautions, NPO status , Patient's Chart, lab work & pertinent test results  History of Anesthesia Complications Negative for: history of anesthetic complications  Airway Mallampati: II  TM Distance: >3 FB Neck ROM: Full    Dental no notable dental hx. (+) Dental Advisory Given   Pulmonary neg pulmonary ROS, Patient abstained from smoking., former smoker,    Pulmonary exam normal        Cardiovascular negative cardio ROS Normal cardiovascular exam     Neuro/Psych negative neurological ROS     GI/Hepatic Neg liver ROS, GERD  ,  Endo/Other  negative endocrine ROS  Renal/GU negative Renal ROS     Musculoskeletal negative musculoskeletal ROS (+)   Abdominal   Peds  Hematology negative hematology ROS (+) Sickle cell trait ,   Anesthesia Other Findings Day of surgery medications reviewed with the patient.  Reproductive/Obstetrics                            Anesthesia Physical Anesthesia Plan  ASA: II  Anesthesia Plan: General   Post-op Pain Management: GA combined w/ Regional for post-op pain   Induction: Intravenous  PONV Risk Score and Plan: 4 or greater and Ondansetron, Dexamethasone, Scopolamine patch - Pre-op and Treatment may vary due to age or medical condition  Airway Management Planned: LMA  Additional Equipment:   Intra-op Plan:   Post-operative Plan: Extubation in OR  Informed Consent: I have reviewed the patients History and Physical, chart, labs and discussed the procedure including the risks, benefits and alternatives for the proposed anesthesia with the patient or authorized representative who has indicated his/her understanding and acceptance.     Dental advisory given  Plan Discussed with: Anesthesiologist and CRNA  Anesthesia Plan Comments:         Anesthesia Quick Evaluation

## 2019-07-13 ENCOUNTER — Ambulatory Visit (HOSPITAL_COMMUNITY): Payer: BC Managed Care – PPO | Admitting: Physician Assistant

## 2019-07-13 ENCOUNTER — Encounter (HOSPITAL_COMMUNITY)
Admission: RE | Disposition: A | Discharge status: Home / Self Care | Payer: Self-pay | Source: Home / Self Care | Attending: Surgery

## 2019-07-13 ENCOUNTER — Observation Stay (HOSPITAL_COMMUNITY)
Admission: RE | Admit: 2019-07-13 | Discharge: 2019-07-14 | Disposition: A | Discharge status: Home / Self Care | Payer: BC Managed Care – PPO | Attending: Surgery | Admitting: Surgery

## 2019-07-13 ENCOUNTER — Encounter (HOSPITAL_COMMUNITY): Payer: Self-pay | Admitting: Surgery

## 2019-07-13 ENCOUNTER — Encounter (HOSPITAL_COMMUNITY)
Admission: RE | Admit: 2019-07-13 | Discharge: 2019-07-13 | Disposition: A | Discharge status: Home / Self Care | Payer: BC Managed Care – PPO | Source: Ambulatory Visit | Attending: Surgery | Admitting: Surgery

## 2019-07-13 ENCOUNTER — Other Ambulatory Visit: Payer: Self-pay

## 2019-07-13 DIAGNOSIS — K219 Gastro-esophageal reflux disease without esophagitis: Secondary | ICD-10-CM | POA: Insufficient documentation

## 2019-07-13 DIAGNOSIS — C50412 Malignant neoplasm of upper-outer quadrant of left female breast: Principal | ICD-10-CM | POA: Insufficient documentation

## 2019-07-13 DIAGNOSIS — D573 Sickle-cell trait: Secondary | ICD-10-CM | POA: Insufficient documentation

## 2019-07-13 DIAGNOSIS — C50912 Malignant neoplasm of unspecified site of left female breast: Secondary | ICD-10-CM | POA: Diagnosis present

## 2019-07-13 DIAGNOSIS — F17291 Nicotine dependence, other tobacco product, in remission: Secondary | ICD-10-CM | POA: Diagnosis not present

## 2019-07-13 DIAGNOSIS — Z170 Estrogen receptor positive status [ER+]: Secondary | ICD-10-CM | POA: Insufficient documentation

## 2019-07-13 DIAGNOSIS — Z79899 Other long term (current) drug therapy: Secondary | ICD-10-CM | POA: Insufficient documentation

## 2019-07-13 HISTORY — PX: BREAST RECONSTRUCTION WITH PLACEMENT OF TISSUE EXPANDER AND ALLODERM: SHX6805

## 2019-07-13 HISTORY — PX: MASTECTOMY: SHX3

## 2019-07-13 HISTORY — PX: NIPPLE SPARING MASTECTOMY WITH SENTINEL LYMPH NODE BIOPSY: SHX6826

## 2019-07-13 LAB — CBC
HCT: 36.3 % (ref 36.0–46.0)
Hemoglobin: 12.7 g/dL (ref 12.0–15.0)
MCH: 30.2 pg (ref 26.0–34.0)
MCHC: 35 g/dL (ref 30.0–36.0)
MCV: 86.4 fL (ref 80.0–100.0)
Platelets: 205 10*3/uL (ref 150–400)
RBC: 4.2 MIL/uL (ref 3.87–5.11)
RDW: 12.7 % (ref 11.5–15.5)
WBC: 16.4 10*3/uL — ABNORMAL HIGH (ref 4.0–10.5)
nRBC: 0 % (ref 0.0–0.2)

## 2019-07-13 LAB — CREATININE, SERUM
Creatinine, Ser: 0.74 mg/dL (ref 0.44–1.00)
GFR calc Af Amer: >60 mL/min (ref >60)
GFR calc non Af Amer: >60 mL/min (ref >60)

## 2019-07-13 LAB — POCT PREGNANCY, URINE: Preg Test, Ur: NEGATIVE

## 2019-07-13 SURGERY — NIPPLE SPARING MASTECTOMY WITH SENTINEL LYMPH NODE BIOPSY
Anesthesia: General | Site: Breast | Laterality: Left

## 2019-07-13 MED ORDER — KETOROLAC TROMETHAMINE 30 MG/ML IJ SOLN
INTRAMUSCULAR | Status: AC
  Filled 2019-07-13: qty 1

## 2019-07-13 MED ORDER — GABAPENTIN 300 MG PO CAPS
300.0000 mg | ORAL_CAPSULE | Freq: Two times a day (BID) | ORAL | Status: DC
  Administered 2019-07-13 – 2019-07-14 (×2): 300 mg via ORAL
  Filled 2019-07-13 (×2): qty 1

## 2019-07-13 MED ORDER — ACETAMINOPHEN 500 MG PO TABS
1000.0000 mg | ORAL_TABLET | Freq: Once | ORAL | Status: AC
  Administered 2019-07-13: 07:00:00 1000 mg via ORAL
  Filled 2019-07-13: qty 2

## 2019-07-13 MED ORDER — PHENYLEPHRINE HCL (PRESSORS) 10 MG/ML IV SOLN
INTRAVENOUS | Status: DC | PRN
  Administered 2019-07-13 (×2): 80 ug via INTRAVENOUS

## 2019-07-13 MED ORDER — SODIUM CHLORIDE (PF) 0.9 % IJ SOLN
INTRAVENOUS | Status: DC | PRN
  Administered 2019-07-13: 5 mL

## 2019-07-13 MED ORDER — CEFAZOLIN SODIUM-DEXTROSE 2-4 GM/100ML-% IV SOLN: 2.0000 g | Freq: Three times a day (TID) | INTRAVENOUS | Status: DC

## 2019-07-13 MED ORDER — HYDROMORPHONE HCL 1 MG/ML IJ SOLN
INTRAMUSCULAR | Status: AC
  Filled 2019-07-13: qty 0.5

## 2019-07-13 MED ORDER — OXYCODONE HCL 5 MG PO TABS
ORAL_TABLET | ORAL | Status: AC
  Filled 2019-07-13: qty 2

## 2019-07-13 MED ORDER — DEXAMETHASONE SODIUM PHOSPHATE 10 MG/ML IJ SOLN
INTRAMUSCULAR | Status: DC | PRN
  Administered 2019-07-13: 4 mg via INTRAVENOUS

## 2019-07-13 MED ORDER — PHENYLEPHRINE 40 MCG/ML (10ML) SYRINGE FOR IV PUSH (FOR BLOOD PRESSURE SUPPORT)
PREFILLED_SYRINGE | INTRAVENOUS | Status: AC
  Filled 2019-07-13: qty 10

## 2019-07-13 MED ORDER — ACETAMINOPHEN 500 MG PO TABS
ORAL_TABLET | ORAL | Status: AC
  Filled 2019-07-13: qty 2

## 2019-07-13 MED ORDER — LIDOCAINE 2% (20 MG/ML) 5 ML SYRINGE
INTRAMUSCULAR | Status: DC | PRN
  Administered 2019-07-13: 40 mg via INTRAVENOUS

## 2019-07-13 MED ORDER — MORPHINE SULFATE (PF) 2 MG/ML IV SOLN
2.0000 mg | INTRAVENOUS | Status: DC | PRN
  Administered 2019-07-13: 16:00:00 2 mg via INTRAVENOUS
  Filled 2019-07-13: qty 1

## 2019-07-13 MED ORDER — ONDANSETRON HCL 4 MG/2ML IJ SOLN: 4.0000 mg | Freq: Four times a day (QID) | INTRAMUSCULAR | Status: DC | PRN

## 2019-07-13 MED ORDER — FENTANYL CITRATE (PF) 250 MCG/5ML IJ SOLN
INTRAMUSCULAR | Status: DC | PRN
  Administered 2019-07-13 (×3): 50 ug via INTRAVENOUS
  Administered 2019-07-13: 100 ug via INTRAVENOUS

## 2019-07-13 MED ORDER — METHYLENE BLUE 0.5 % INJ SOLN
INTRAVENOUS | Status: AC
  Filled 2019-07-13: qty 10

## 2019-07-13 MED ORDER — SCOPOLAMINE 1 MG/3DAYS TD PT72
1.0000 | MEDICATED_PATCH | TRANSDERMAL | Status: DC
  Administered 2019-07-13: 07:00:00 1.5 mg via TRANSDERMAL
  Filled 2019-07-13: qty 1

## 2019-07-13 MED ORDER — FENTANYL CITRATE (PF) 100 MCG/2ML IJ SOLN
INTRAMUSCULAR | Status: AC
  Filled 2019-07-13: qty 2

## 2019-07-13 MED ORDER — CEFAZOLIN SODIUM-DEXTROSE 1-4 GM/50ML-% IV SOLN
1.0000 g | Freq: Three times a day (TID) | INTRAVENOUS | Status: AC
  Administered 2019-07-13 – 2019-07-14 (×3): 1 g via INTRAVENOUS
  Filled 2019-07-13 (×3): qty 50

## 2019-07-13 MED ORDER — DEXAMETHASONE SODIUM PHOSPHATE 10 MG/ML IJ SOLN
INTRAMUSCULAR | Status: AC
  Filled 2019-07-13: qty 1

## 2019-07-13 MED ORDER — ENOXAPARIN SODIUM 40 MG/0.4ML ~~LOC~~ SOLN
40.0000 mg | SUBCUTANEOUS | Status: DC
  Administered 2019-07-14: 08:00:00 40 mg via SUBCUTANEOUS
  Filled 2019-07-13: qty 0.4

## 2019-07-13 MED ORDER — KETOROLAC TROMETHAMINE 30 MG/ML IJ SOLN
30.0000 mg | Freq: Four times a day (QID) | INTRAMUSCULAR | Status: DC
  Administered 2019-07-13 – 2019-07-14 (×4): 30 mg via INTRAVENOUS
  Filled 2019-07-13 (×3): qty 1

## 2019-07-13 MED ORDER — CEFAZOLIN SODIUM-DEXTROSE 2-4 GM/100ML-% IV SOLN
2.0000 g | INTRAVENOUS | Status: AC
  Administered 2019-07-13: 2 g via INTRAVENOUS
  Filled 2019-07-13: qty 100

## 2019-07-13 MED ORDER — MIDAZOLAM HCL 2 MG/2ML IJ SOLN
INTRAMUSCULAR | Status: AC
  Filled 2019-07-13: qty 2

## 2019-07-13 MED ORDER — BUPIVACAINE HCL (PF) 0.25 % IJ SOLN
INTRAMUSCULAR | Status: AC
  Filled 2019-07-13: qty 30

## 2019-07-13 MED ORDER — ONDANSETRON 4 MG PO TBDP: 4.0000 mg | ORAL_TABLET | Freq: Four times a day (QID) | ORAL | Status: DC | PRN

## 2019-07-13 MED ORDER — ACETAMINOPHEN 500 MG PO TABS
1000.0000 mg | ORAL_TABLET | Freq: Four times a day (QID) | ORAL | Status: DC
  Administered 2019-07-13 – 2019-07-14 (×4): 1000 mg via ORAL
  Filled 2019-07-13 (×4): qty 2

## 2019-07-13 MED ORDER — SODIUM CHLORIDE 0.9 % IV SOLN
Freq: Once | INTRAVENOUS | Status: DC
  Filled 2019-07-13: qty 1

## 2019-07-13 MED ORDER — ZOLPIDEM TARTRATE 5 MG PO TABS: 5.0000 mg | ORAL_TABLET | Freq: Every evening | ORAL | Status: DC | PRN

## 2019-07-13 MED ORDER — MIDAZOLAM HCL 2 MG/2ML IJ SOLN
INTRAMUSCULAR | Status: DC | PRN
  Administered 2019-07-13: 2 mg via INTRAVENOUS

## 2019-07-13 MED ORDER — HYDROMORPHONE HCL 1 MG/ML IJ SOLN
INTRAMUSCULAR | Status: DC | PRN
  Administered 2019-07-13 (×2): .5 mg via INTRAVENOUS

## 2019-07-13 MED ORDER — PANTOPRAZOLE SODIUM 40 MG PO TBEC
40.0000 mg | DELAYED_RELEASE_TABLET | Freq: Every day | ORAL | Status: DC
  Administered 2019-07-14: 10:00:00 40 mg via ORAL
  Filled 2019-07-13: qty 1

## 2019-07-13 MED ORDER — LIDOCAINE 2% (20 MG/ML) 5 ML SYRINGE
INTRAMUSCULAR | Status: AC
  Filled 2019-07-13: qty 5

## 2019-07-13 MED ORDER — PROMETHAZINE HCL 25 MG/ML IJ SOLN
INTRAMUSCULAR | Status: AC
  Filled 2019-07-13: qty 1

## 2019-07-13 MED ORDER — FENTANYL CITRATE (PF) 250 MCG/5ML IJ SOLN
INTRAMUSCULAR | Status: AC
  Filled 2019-07-13: qty 5

## 2019-07-13 MED ORDER — POTASSIUM CHLORIDE IN NACL 20-0.9 MEQ/L-% IV SOLN
INTRAVENOUS | Status: DC
  Filled 2019-07-13: qty 1000

## 2019-07-13 MED ORDER — TRAMADOL HCL 50 MG PO TABS: 50.0000 mg | ORAL_TABLET | Freq: Four times a day (QID) | ORAL | Status: DC | PRN

## 2019-07-13 MED ORDER — BUPIVACAINE HCL (PF) 0.25 % IJ SOLN
INTRAMUSCULAR | Status: DC | PRN
  Administered 2019-07-13: 15 mg via EPIDURAL

## 2019-07-13 MED ORDER — 0.9 % SODIUM CHLORIDE (POUR BTL) OPTIME
TOPICAL | Status: DC | PRN
  Administered 2019-07-13: 1000 mL

## 2019-07-13 MED ORDER — BUPIVACAINE HCL (PF) 0.5 % IJ SOLN
INTRAMUSCULAR | Status: AC
  Filled 2019-07-13: qty 30

## 2019-07-13 MED ORDER — DIPHENHYDRAMINE HCL 12.5 MG/5ML PO ELIX: 12.5000 mg | ORAL_SOLUTION | Freq: Four times a day (QID) | ORAL | Status: DC | PRN

## 2019-07-13 MED ORDER — CHLORHEXIDINE GLUCONATE CLOTH 2 % EX PADS: 6.0000 | MEDICATED_PAD | Freq: Once | CUTANEOUS | Status: DC

## 2019-07-13 MED ORDER — GABAPENTIN 300 MG PO CAPS
300.0000 mg | ORAL_CAPSULE | ORAL | Status: AC
  Administered 2019-07-13: 300 mg via ORAL
  Filled 2019-07-13: qty 1

## 2019-07-13 MED ORDER — BUPIVACAINE LIPOSOME 1.3 % IJ SUSP
INTRAMUSCULAR | Status: DC | PRN
  Administered 2019-07-13: 10 mg via PERINEURAL

## 2019-07-13 MED ORDER — PROPOFOL 10 MG/ML IV BOLUS
INTRAVENOUS | Status: DC | PRN
  Administered 2019-07-13: 200 ug via INTRAVENOUS
  Administered 2019-07-13: 50 ug via INTRAVENOUS

## 2019-07-13 MED ORDER — LACTATED RINGERS IV SOLN: INTRAVENOUS | Status: DC | PRN

## 2019-07-13 MED ORDER — CELECOXIB 200 MG PO CAPS
400.0000 mg | ORAL_CAPSULE | Freq: Once | ORAL | Status: AC
  Administered 2019-07-13: 07:00:00 400 mg via ORAL
  Filled 2019-07-13: qty 2

## 2019-07-13 MED ORDER — OXYCODONE HCL 5 MG PO TABS
5.0000 mg | ORAL_TABLET | ORAL | Status: DC | PRN
  Administered 2019-07-13 – 2019-07-14 (×3): 10 mg via ORAL
  Filled 2019-07-13 (×3): qty 2

## 2019-07-13 MED ORDER — PROMETHAZINE HCL 25 MG/ML IJ SOLN
6.2500 mg | INTRAMUSCULAR | Status: DC | PRN
  Administered 2019-07-13: 12:00:00 12.5 mg via INTRAVENOUS

## 2019-07-13 MED ORDER — PROPOFOL 10 MG/ML IV BOLUS
INTRAVENOUS | Status: AC
  Filled 2019-07-13: qty 20

## 2019-07-13 MED ORDER — ONDANSETRON HCL 4 MG/2ML IJ SOLN
INTRAMUSCULAR | Status: AC
  Filled 2019-07-13: qty 2

## 2019-07-13 MED ORDER — BUPIVACAINE HCL (PF) 0.25 % IJ SOLN
INTRAMUSCULAR | Status: DC | PRN
  Administered 2019-07-13: 30 mL

## 2019-07-13 MED ORDER — SODIUM CHLORIDE 0.9 % IV SOLN
INTRAVENOUS | Status: DC | PRN
  Administered 2019-07-13: 1000 mL

## 2019-07-13 MED ORDER — FENTANYL CITRATE (PF) 100 MCG/2ML IJ SOLN: 25.0000 ug | INTRAMUSCULAR | Status: DC | PRN

## 2019-07-13 MED ORDER — DIPHENHYDRAMINE HCL 50 MG/ML IJ SOLN: 12.5000 mg | Freq: Four times a day (QID) | INTRAMUSCULAR | Status: DC | PRN

## 2019-07-13 MED ORDER — ONDANSETRON HCL 4 MG/2ML IJ SOLN
INTRAMUSCULAR | Status: DC | PRN
  Administered 2019-07-13: 4 mg via INTRAVENOUS

## 2019-07-13 SURGICAL SUPPLY — 66 items
ALLODERM 16X20 PERFORATED (Tissue) ×1 IMPLANT
ALLOGRAFT PERF 16X20 1.6+/-0.4 (Tissue) ×1 IMPLANT
APPLIER CLIP 11 MED OPEN (CLIP) ×3
APPLIER CLIP 9.375 MED OPEN (MISCELLANEOUS) ×3
BAG DECANTER FOR FLEXI CONT (MISCELLANEOUS) ×3 IMPLANT
BINDER BREAST LRG (GAUZE/BANDAGES/DRESSINGS) ×2 IMPLANT
BINDER BREAST XLRG (GAUZE/BANDAGES/DRESSINGS) IMPLANT
CANISTER SUCT 3000ML PPV (MISCELLANEOUS) ×6 IMPLANT
CHLORAPREP W/TINT 26 (MISCELLANEOUS) ×6 IMPLANT
CLIP APPLIE 11 MED OPEN (CLIP) IMPLANT
CLIP APPLIE 9.375 MED OPEN (MISCELLANEOUS) IMPLANT
COVER SURGICAL LIGHT HANDLE (MISCELLANEOUS) ×6 IMPLANT
COVER WAND RF STERILE (DRAPES) ×2 IMPLANT
DERMABOND ADVANCED (GAUZE/BANDAGES/DRESSINGS) ×4
DERMABOND ADVANCED .7 DNX12 (GAUZE/BANDAGES/DRESSINGS) ×2 IMPLANT
DRAIN CHANNEL 15F RND FF W/TCR (WOUND CARE) IMPLANT
DRAIN CHANNEL 19F RND (DRAIN) ×3 IMPLANT
DRAPE CHEST BREAST 15X10 FENES (DRAPES) ×1 IMPLANT
DRAPE HALF SHEET 40X57 (DRAPES) ×4 IMPLANT
DRAPE ORTHO SPLIT 77X108 STRL (DRAPES) ×4
DRAPE SURG ORHT 6 SPLT 77X108 (DRAPES) ×2 IMPLANT
DRAPE WARM FLUID 44X44 (DRAPES) ×3 IMPLANT
DRSG PAD ABDOMINAL 8X10 ST (GAUZE/BANDAGES/DRESSINGS) ×6 IMPLANT
DRSG TEGADERM 4X4.75 (GAUZE/BANDAGES/DRESSINGS) ×12 IMPLANT
ELECT BLADE 4.0 EZ CLEAN MEGAD (MISCELLANEOUS) ×6
ELECT CAUTERY BLADE 6.4 (BLADE) ×3 IMPLANT
ELECT COATED BLADE 2.86 ST (ELECTRODE) ×3 IMPLANT
ELECT REM PT RETURN 9FT ADLT (ELECTROSURGICAL) ×3
ELECTRODE BLDE 4.0 EZ CLN MEGD (MISCELLANEOUS) ×2 IMPLANT
ELECTRODE REM PT RTRN 9FT ADLT (ELECTROSURGICAL) ×2 IMPLANT
EVACUATOR SILICONE 100CC (DRAIN) ×3 IMPLANT
EXPANDER TISSUE FORTE 300CC (Breast) IMPLANT
GAUZE SPONGE 4X4 12PLY STRL (GAUZE/BANDAGES/DRESSINGS) ×2 IMPLANT
GAUZE SPONGE 4X4 12PLY STRL LF (GAUZE/BANDAGES/DRESSINGS) ×3 IMPLANT
GLOVE BIO SURGEON STRL SZ 6 (GLOVE) ×11 IMPLANT
GOWN STRL REUS W/ TWL LRG LVL3 (GOWN DISPOSABLE) ×4 IMPLANT
GOWN STRL REUS W/TWL LRG LVL3 (GOWN DISPOSABLE) ×10
KIT BASIN OR (CUSTOM PROCEDURE TRAY) ×6 IMPLANT
KIT TURNOVER KIT B (KITS) ×6 IMPLANT
MARKER SKIN DUAL TIP RULER LAB (MISCELLANEOUS) ×3 IMPLANT
NS IRRIG 1000ML POUR BTL (IV SOLUTION) ×6 IMPLANT
PACK GENERAL/GYN (CUSTOM PROCEDURE TRAY) ×6 IMPLANT
PAD ABD 8X10 STRL (GAUZE/BANDAGES/DRESSINGS) ×4 IMPLANT
PAD ARMBOARD 7.5X6 YLW CONV (MISCELLANEOUS) ×6 IMPLANT
PENCIL SMOKE EVACUATOR (MISCELLANEOUS) ×3 IMPLANT
PIN SAFETY STERILE (MISCELLANEOUS) ×3 IMPLANT
SET ASEPTIC TRANSFER (MISCELLANEOUS) ×3 IMPLANT
SOL PREP POV-IOD 4OZ 10% (MISCELLANEOUS) ×3 IMPLANT
SPECIMEN JAR X LARGE (MISCELLANEOUS) ×3 IMPLANT
STAPLER VISISTAT 35W (STAPLE) ×1 IMPLANT
SUT CHROMIC 4 0 PS 2 18 (SUTURE) ×6 IMPLANT
SUT ETHILON 2 0 FS 18 (SUTURE) IMPLANT
SUT ETHILON 3 0 FSL (SUTURE) ×1 IMPLANT
SUT MNCRL AB 4-0 PS2 18 (SUTURE) ×1 IMPLANT
SUT SILK 2 0 SH (SUTURE) ×2 IMPLANT
SUT VIC AB 0 CT2 27 (SUTURE) IMPLANT
SUT VIC AB 3-0 SH 18 (SUTURE) ×3 IMPLANT
SUT VIC AB 3-0 SH 27 (SUTURE)
SUT VIC AB 3-0 SH 27X BRD (SUTURE) IMPLANT
SUT VICRYL 4-0 PS2 18IN ABS (SUTURE) IMPLANT
SUT VLOC 180 0 24IN GS25 (SUTURE) IMPLANT
SYR BULB IRRIGATION 50ML (SYRINGE) ×3 IMPLANT
TISSUE EXPANDER FORTE 300CC (Breast) ×3 IMPLANT
TOWEL GREEN STERILE (TOWEL DISPOSABLE) ×6 IMPLANT
TOWEL GREEN STERILE FF (TOWEL DISPOSABLE) ×6 IMPLANT
TRAY FOLEY MTR SLVR 16FR STAT (SET/KITS/TRAYS/PACK) IMPLANT

## 2019-07-13 NOTE — Op Note (Addendum)
Preop diagnosis: Invasive ductal carcinoma left breast Postop diagnosis: Same Procedure performed: Left nipple sparing mastectomy with blue dye injection and axillary sentinel lymph node biopsy Surgeon:Ayuub Penley K Benjaman Artman Co-surgeon: Dr. Celedonio Miyamoto Anesthesia: General endotracheal and pectoralis block Indications:  This is a 39 year old female from [place], [place] who initially felt a palpable mass in her left breast about a year ago. This fluctuated in size initially but later this summer, became more prominent. She underwent work-up including biopsy that revealed invasive ductal carcinoma with DCIS at 0100. She also had some microcalcifications extending anteriorly from the mass in the LUOQ which revealed DCIS. A single lymph node was biopsied and was negative. ER+/ PR-, Ki67-35%.  MRI was obtained that showed several other suspicious areas within the left breast.  Genetics was negative.  She made the decision to proceed with mastectomy with reconstruction.  She is a candidate for nipple sparing mastectomy.  Description of procedure: The patient is brought to the operating room and placed in the supine position on the operating room table.  After an adequate level of general anesthesia was obtained her chest was prepped with ChloraPrep and draped in sterile fashion.  A timeout was taken to ensure the proper patient and proper procedure.  I injected methylene blue dye solution around the nipple.  An incision had been outlined in the inframammary crease by plastic surgery.  I made my incision in this area and we dissected down to the chest wall in the inframammary crease.  We then dissected the breast tissue off of the anterior surface of the pectoralis muscle.  Our margins were the edge of the sternum medially the inframammary crease inferiorly the anterior border of the latissimus dorsi laterally, and the infraclavicular fascia superiorly.  We raised the breast tissue off of the underlying muscle  including the anterior layer of fascia.  We then dissected up into the axilla.  I was able to identify 1 hot lymph node.  We dissected this free.  It did seem to contain some blue dye.  This was sent as sentinel lymph node 1.  There did seem to be some residual activity in the axilla but I could not adequately visualize this.  We decided to come back after we completed the mastectomy to make a separate incision for the sentinel lymph node biopsy.  We then dissected the anterior surface of the breast tissue away from the overlying skin.  We dissected to the same margins as listed before.  I left a thin layer of breast tissue on the posterior surface of the dermis for perfusion.  We dissected our specimen from medial to lateral.  The specimen was removed and was marked with a single suture at the nipple a long suture lateral and a short suture superiorly.  We then took a biopsy of the posterior surface of the dermis of the nipple and sent this as a separate specimen.  We inspected the wound for hemostasis.  We made a transverse incision across the axilla.  We dissected into the axilla and I was able to identify an additional sentinel lymph node.  This was sent for pathologic examination.  There was some oozing in this area.  We used clips for hemostasis.  The wound is packed with a sponge while Dr. Iran Planas proceeded with the reconstruction.  I will come back later to inspect the hemostasis and closed the wound.  Dr. Iran Planas will dictate the remainder of the surgery separately.  Imogene Burn. Jodeci Rini, MD, FACS  [facility]  General/ Trauma Surgery   07/13/2019 9:55 AM    The wound was inspected and hemostasis was good.  I closed the wound with 3-0 Vicryl and 4-0 Monocryl.  Dr. Iran Planas will close the mastectomy incision.  Imogene Burn. Georgette Dover, MD, [facility]  General/ Trauma Surgery   07/13/2019 10:16 AM

## 2019-07-13 NOTE — Interval H&P Note (Signed)
History and Physical Interval Note:  07/13/2019 7:03 AM  Victoria Barker  has presented today for surgery, with the diagnosis of left breast invasive ductal carcinoma.  The various methods of treatment have been discussed with the patient and family. After consideration of risks, benefits and other options for treatment, the patient has consented to  Procedure(s): LEFT NIPPLE SPARING MASTECTOMY WITH SENTINEL LYMPH NODE BIOPSY (Left) LEFT BREAST RECONSTRUCTION WITH PLACEMENT OF TISSUE EXPANDER AND ALLODERM (Left) as a surgical intervention.  The patient's history has been reviewed, patient examined, no change in status, stable for surgery.  I have reviewed the patient's chart and labs.  Questions were answered to the patient's satisfaction.     Arnoldo Hooker Shandrell Boda

## 2019-07-13 NOTE — Plan of Care (Signed)
  Problem: Education: Goal: Knowledge of disease or condition will improve Outcome: Progressing   

## 2019-07-13 NOTE — Progress Notes (Signed)
Nuclear Med notified at 0703.

## 2019-07-13 NOTE — Op Note (Signed)
Operative Note   DATE OF OPERATION: 1.19.2021  LOCATION:  Main OR-observation  SURGICAL DIVISION: Plastic Surgery  PREOPERATIVE DIAGNOSES:  1. Left breast cancer UOQ ER+  POSTOPERATIVE DIAGNOSES:  same  PROCEDURE:  1. Left breast reconstruction with tissue expander 2. Acellular dermis (Alloderm) to left chest 275cm2  SURGEON: Irene Limbo MD MBA  ASSISTANT: none  ANESTHESIA:  General.   EBL: 30 ml for entire procedure  COMPLICATIONS: None immediate.   INDICATIONS FOR PROCEDURE:  The patient, Victoria Barker, is a 39 y.o. female born on [birth year], is here for immediate prepectoral expander based reconstruction following nipple sparing mastectomy   FINDINGS: Natrelle 133S FV-11-T 300 ml tissue expander placed, initial fill volume 175 ml air. SN [id number]  DESCRIPTION OF PROCEDURE:  The patient was marked with the patient in the preoperative area to mark sternal notch, chest midline, anterior axillary lines and inframammary folds.The patient's operative site was prepped and draped in a sterile fashion. A time out was performed and all information was confirmed to be correct.I assisted in mastectomy and sentinel node sampling with exposure and retraction.Following completion of mastectomy, the cavity was irrigated with salinesolution containing Ancef, gentamicin, and bacitracin. Hemostasis was ensured.Laterally the mastectomy flap over posterior axillary line was advanced anteriorly and the subcutaneous tissue and superficial fascia was secured to pectoralisand serratusmuscleswith 0-vicryl.A 19 Fr drain was placed in subcutaneous position laterally and a 15 Fr drain placed along inframammary fold. Each secured to skin with 2-0 nylon. Cavity irrigated with Betadinesaline solution. The tissue expanders were prepared on back table prior in insertion. The expander was filled with air.Perforated acellular dermiswasdraped over anterior surface expander. The ADM was then secured  to itself over posterior surface of expanderwith 4-0 chromic. Redundant folds acellular dermis excised so that the ADM layflat without folds over air filled expander.The expander was secured tofascia over lateral sternal borderwith a 0 vicryl. Thelateral tab wasalso secured to pectoralis muscle with 0-vicryl. The ADM was secured to pectoralis muscle and chest wall along inferior border at inframammary foldwith 0 V-lock suture. Skin closure completedwith 3-0 vicryl in fascial layer and 4-0 vicryl in dermis. Skin closure completed with 4-0 monocryl subcuticular and tissue adhesive.  Patient brought to sitting position.The mastectomy flap was redrapedso that NAC was symmetric from chest midline with right breast and over most projecting part of expander. Patient returned to supine position.Tegaderm dressings applied followed bydry dressing,breast binder.  The patient was allowed to wake from anesthesia, extubated and taken to the recovery room in satisfactory condition.   SPECIMENS: none  DRAINS: 15 and 19 Fr JP in left breast reconstruction

## 2019-07-13 NOTE — H&P (Signed)
History of Present Illness  The patient is a 39 year old female who presents with breast cancer. Breast [place] PCP - [name]  This is a 39 year old female from [place], [place] who initially felt a palpable mass in her left breast about a year ago. This fluctuated in size initially but later this summer, became more prominent. She underwent work-up including biopsy that revealed invasive ductal carcinoma with DCIS at 0100. She also had some microcalcifications extending anteriorly from the mass in the LUOQ which revealed DCIS. A single lymph node was biopsied and was negative. ER+/ PR-, Ki67-35%.   Menarche - 45 First pregnancy - 17 Breastfeed - no OCP/ Depo Provera/ Mirena - current Family history - negative for breast cancer   CLINICAL DATA: Patient presents for ultrasound-guided core needle biopsy of a spiculated upper-outer quadrant left breast mass as well as a borderline abnormal left axillary lymph node. She is also scheduled to undergo stereotactic biopsy of calcifications extending anterior from the left breast mass.  EXAM: ULTRASOUND GUIDED LEFT BREAST CORE NEEDLE BIOPSY  ULTRASOUND GUIDED LEFT AXILLARY LYMPH NODE CORE NEEDLE BIOPSY  COMPARISON: Previous exam(s).  FINDINGS: I met with the patient and we discussed the procedure of ultrasound-guided biopsy, including benefits and alternatives. We discussed the high likelihood of a successful procedure. We discussed the risks of the procedure, including infection, bleeding, tissue injury, clip migration, and inadequate sampling. Informed written consent was given. The usual time-out protocol was performed immediately prior to the procedure.  Lesion #1: Breast Mass.  Lesion quadrant: Upper outer quadrant  Using sterile technique and 1% Lidocaine as local anesthetic, under direct ultrasound visualization, a 12 gauge spring-loaded device was used to perform biopsy of the 1 o'clock position spiculated  left breast mass using a inferolateral approach. At the conclusion of the procedure ribbon shaped tissue marker clip was deployed into the biopsy cavity.  Lesion #2: Axillary Lymph Node.  Lesion location: Left axilla.  Using sterile technique and 1% Lidocaine as local anesthetic, under direct ultrasound visualization, a 14 gauge spring-loaded device was used to perform biopsy of the borderline abnormal left axillary lymph node using a inferolateral approach. At the conclusion of the procedure Q shaped,HydroMARK tissue marker clip was deployed into the biopsy cavity.  Follow up 2 view mammogram was performed and dictated separately.  IMPRESSION: Ultrasound guided biopsy of a left breast mass and a borderline abnormal left axillary lymph node. No apparent complications.  Electronically Signed: By: Lajean Manes M.D. On: 04/28/2019 08:25   Past Surgical History  Breast Biopsy Left. Oral Surgery  Diagnostic Studies History  Colonoscopy never Mammogram within last year Pap Smear 1-5 years ago  Allergies  No Known Allergies   Medication History  Medications Reconciled Dexilant (60MG Capsule DR, Oral) Active.  Social History  Caffeine use Carbonated beverages, Coffee, Tea. No alcohol use No drug use Tobacco use Former smoker.  Family History First Degree Relatives No pertinent family history  Pregnancy / Birth History  Age at menarche 66 years. Contraceptive History Oral contraceptives.  Other Problems  Gastroesophageal Reflux Disease     Review of Systems General Not Present- Appetite Loss, Chills, Fatigue, Fever, Night Sweats, Weight Gain and Weight Loss. Skin Not Present- Change in Wart/Mole, Dryness, Hives, Jaundice, New Lesions, Non-Healing Wounds, Rash and Ulcer. Breast Not Present- Breast Mass, Breast Pain, Nipple Discharge and Skin Changes. Cardiovascular Not Present- Chest Pain, Difficulty Breathing Lying Down,  Leg Cramps, Palpitations, Rapid Heart Rate, Shortness of Breath and Swelling of  Extremities. Female Genitourinary Not Present- Frequency, Nocturia, Painful Urination, Pelvic Pain and Urgency. Musculoskeletal Not Present- Back Pain, Joint Pain, Joint Stiffness, Muscle Pain, Muscle Weakness and Swelling of Extremities. Neurological Not Present- Decreased Memory, Fainting, Headaches, Numbness, Seizures, Tingling, Tremor, Trouble walking and Weakness. Endocrine Not Present- Cold Intolerance, Excessive Hunger, Hair Changes, Heat Intolerance, Hot flashes and New Diabetes.   Physical Exam   The physical exam findings are as follows: Note:WDWN in NAD Eyes: Pupils equal, round; sclera anicteric HENT: Oral mucosa moist; good dentition Neck: No masses palpated, no thyromegaly Lungs: CTA bilaterally; normal respiratory effort Breasts: symmetric; bilateral fibrocystic changes; no nipple retraction or discharge; no axillary lymphadenopathy; firmness in LUOQ CV: Regular rate and rhythm; no murmurs; extremities well-perfused with no edema Abd: +bowel sounds, soft, non-tender, no palpable organomegaly; no palpable hernias Skin: Warm, dry; no sign of jaundice Psychiatric - alert and oriented x 4; calm mood and affect    Assessment & Plan   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 1.8 cm at 0100 4 cmfn. Calcifications extending anteriorly toward the nipple. Total area 7.6 cm anterior-posterior x 6.2 cm x 4.9 cm  Note:I spent approximately 45-50 minutes with the patient and her cousin discussing her diagnosis and the surgical treatment options. We discussed mastectomy with or without reconstruction. We discussed nipple-sparing mastectomy. We also discussed breast conserving therapy, which would be a fairly large lumpectomy in her, with sentinel lymph node biopsy followed by radiation.   After consultation with Plastics and Oncology, she has decided  on left nipple sparing mastectomy with sentinel lymph node biopsy.  The surgical procedure has been discussed with the patient.  Potential risks, benefits, alternative treatments, and expected outcomes have been explained.  All of the patient's questions at this time have been answered.  The likelihood of reaching the patient's treatment goal is good.  The patient understand the proposed surgical procedure and wishes to proceed.    Imogene Burn. Georgette Dover, MD, [facility]  General/ Trauma Surgery   07/13/2019 7:05 AM

## 2019-07-13 NOTE — Anesthesia Postprocedure Evaluation (Signed)
Anesthesia Post Note  Patient: Radio producer  Procedure(s) Performed: LEFT NIPPLE SPARING MASTECTOMY WITH SENTINEL LYMPH NODE BIOPSY (Left Breast) LEFT BREAST RECONSTRUCTION WITH PLACEMENT OF TISSUE EXPANDER AND ALLODERM (Left Breast)     Patient location during evaluation: PACU Anesthesia Type: General Level of consciousness: sedated Pain management: pain level controlled Vital Signs Assessment: post-procedure vital signs reviewed and stable Respiratory status: spontaneous breathing and respiratory function stable Cardiovascular status: stable Postop Assessment: no apparent nausea or vomiting Anesthetic complications: no    Last Vitals:  Vitals:   07/13/19 1144 07/13/19 1145  BP:  131/80  Pulse:  81  Resp:  17  Temp: 36.8 C   SpO2:  100%    Last Pain:  Vitals:   07/13/19 1144  TempSrc:   PainSc: 2                  Dior Dominik DANIEL

## 2019-07-13 NOTE — Anesthesia Procedure Notes (Addendum)
Procedure Name: LMA Insertion Date/Time: 07/13/2019 7:40 AM Performed by: Amadeo Garnet, CRNA Pre-anesthesia Checklist: Patient identified, Emergency Drugs available, Suction available and Patient being monitored Patient Re-evaluated:Patient Re-evaluated prior to induction Oxygen Delivery Method: Circle system utilized Preoxygenation: Pre-oxygenation with 100% oxygen Induction Type: IV induction Ventilation: Mask ventilation without difficulty LMA: LMA inserted LMA Size: 4.0 Placement Confirmation: positive ETCO2 and breath sounds checked- equal and bilateral Tube secured with: Tape Dental Injury: Teeth and Oropharynx as per pre-operative assessment

## 2019-07-13 NOTE — Transfer of Care (Signed)
Immediate Anesthesia Transfer of Care Note  Patient: Victoria Barker  Procedure(s) Performed: LEFT NIPPLE SPARING MASTECTOMY WITH SENTINEL LYMPH NODE BIOPSY (Left Breast) LEFT BREAST RECONSTRUCTION WITH PLACEMENT OF TISSUE EXPANDER AND ALLODERM (Left Breast)  Patient Location: PACU  Anesthesia Type:General  Level of Consciousness: awake, alert  and oriented  Airway & Oxygen Therapy: Patient Spontanous Breathing  Post-op Assessment: Report given to RN, Post -op Vital signs reviewed and stable and Patient moving all extremities  Post vital signs: Reviewed and stable  Last Vitals:  Vitals Value Taken Time  BP    Temp    Pulse 98 07/13/19 1057  Resp 11 07/13/19 1057  SpO2 100 % 07/13/19 1057  Vitals shown include unvalidated device data.  Last Pain:  Vitals:   07/13/19 [id number]  TempSrc: Oral  PainSc: 0-No pain      Patients Stated Pain Goal: 3 ([id number])  Complications: No apparent anesthesia complications

## 2019-07-13 NOTE — Anesthesia Procedure Notes (Signed)
Anesthesia Regional Block: Pectoralis block   Pre-Anesthetic Checklist: ,, timeout performed, Correct Patient, Correct Site, Correct Laterality, Correct Procedure, Correct Position, site marked, Risks and benefits discussed,  Surgical consent,  Pre-op evaluation,  At surgeon's request and post-op pain management  Laterality: Left  Prep: chloraprep       Needles:  Injection technique: Single-shot  Needle Type: Echogenic Stimulator Needle     Needle Length: 10cm  Needle Gauge: 21     Additional Needles:   Narrative:  Start time: 07/13/2019 7:01 AM End time: 07/13/2019 7:11 AM Injection made incrementally with aspirations every 5 mL.  Performed by: Personally

## 2019-07-14 ENCOUNTER — Encounter: Payer: Self-pay | Admitting: *Deleted

## 2019-07-14 DIAGNOSIS — C50412 Malignant neoplasm of upper-outer quadrant of left female breast: Secondary | ICD-10-CM | POA: Diagnosis not present

## 2019-07-14 LAB — BASIC METABOLIC PANEL
Anion gap: 6 (ref 5–15)
BUN: 7 mg/dL (ref 6–20)
CO2: 26 mmol/L (ref 22–32)
Calcium: 8.4 mg/dL — ABNORMAL LOW (ref 8.9–10.3)
Chloride: 107 mmol/L (ref 98–111)
Creatinine, Ser: 0.84 mg/dL (ref 0.44–1.00)
GFR calc Af Amer: >60 mL/min (ref >60)
GFR calc non Af Amer: >60 mL/min (ref >60)
Glucose, Bld: 108 mg/dL — ABNORMAL HIGH (ref 70–99)
Potassium: 3.7 mmol/L (ref 3.5–5.1)
Sodium: 139 mmol/L (ref 135–145)

## 2019-07-14 LAB — CBC
HCT: 32.5 % — ABNORMAL LOW (ref 36.0–46.0)
Hemoglobin: 10.8 g/dL — ABNORMAL LOW (ref 12.0–15.0)
MCH: 29.5 pg (ref 26.0–34.0)
MCHC: 33.2 g/dL (ref 30.0–36.0)
MCV: 88.8 fL (ref 80.0–100.0)
Platelets: 179 10*3/uL (ref 150–400)
RBC: 3.66 MIL/uL — ABNORMAL LOW (ref 3.87–5.11)
RDW: 13 % (ref 11.5–15.5)
WBC: 14.6 10*3/uL — ABNORMAL HIGH (ref 4.0–10.5)
nRBC: 0 % (ref 0.0–0.2)

## 2019-07-14 MED ORDER — SULFAMETHOXAZOLE-TRIMETHOPRIM 800-160 MG PO TABS: 1.0000 | ORAL_TABLET | Freq: Two times a day (BID) | ORAL | 0 refills | Status: DC

## 2019-07-14 MED ORDER — OXYCODONE HCL 5 MG PO TABS: 5.0000 mg | ORAL_TABLET | ORAL | 0 refills | Status: DC | PRN

## 2019-07-14 MED ORDER — METHOCARBAMOL 500 MG PO TABS: 500.0000 mg | ORAL_TABLET | Freq: Three times a day (TID) | ORAL | 0 refills | Status: DC | PRN

## 2019-07-14 NOTE — Discharge Summary (Signed)
Physician Discharge Summary  Patient ID: Victoria Barker MRN: [id number] DOB/AGE: 09-12-1980 39 y.o.  Admit date: 07/13/2019 Discharge date: 07/14/2019  Admission Diagnoses: Left breast cancer  Discharge Diagnoses:  Active Problems:   Invasive ductal carcinoma of breast, female, left [facility])   Discharged Condition: stable  Hospital Course: Post operatively patient did well tolerating diet, ambulatory with minimal assist and pain controlled. Instructed on incision and drain care.  Treatments: surgery: left nipple sparing mastectomy sentinel node left breast reconstruction with tissue expander acellular dermis 1.19.21  Discharge Exam: Blood pressure (!) 99/58, pulse 76, temperature 98.8 F (37.1 C), temperature source Oral, resp. rate 18, height 5\' 2"  (1.575 m), weight 53.1 kg, last menstrual period 06/20/2019, SpO2 100 %. Incision/Wound: chest soft incisions dry Tegaderms in place drains serosanguinous  Disposition: Discharge disposition: 01-Home or Self Care       Discharge Instructions    Call MD for:  redness, tenderness, or signs of infection (pain, swelling, bleeding, redness, odor or green/yellow discharge around incision site)   Complete by: As directed    Call MD for:  temperature >100.5   Complete by: As directed    Discharge instructions   Complete by: As directed    Ok to remove dressings and shower am 1.21.21. Soap and water ok, pat Tegderms dry. Do not remove tegaderms. No creams or ointments over incisions. Do not let drains dangle in shower, attach to lanyard or similar.Strip and record drains twice daily and bring log to clinic visit.  Breast binder or soft compression bra all other times.  Ok to raise arms above shoulders for bathing and dressing.  No house yard work or exercise until cleared by MD.   Recommend ibuprofen with meals as directed to aid with pain control. Also ok to use Tylenol for pain. Recommend Miralax or Dulcolax as needed for constipation    Driving Restrictions   Complete by: As directed    No driving for 2 weeks then no driving if taking narcotics   Lifting restrictions   Complete by: As directed    No lifting > 5 lbs until cleared by MD   Resume previous diet   Complete by: As directed      Allergies as of 07/14/2019   No Known Allergies     Medication List    STOP taking these medications   tamoxifen 20 MG tablet Commonly known as: NOLVADEX     TAKE these medications   CULTURELLE DIGESTIVE HEALTH PO Take 1 capsule by mouth daily as needed (digestive health.).   Dexilant 60 MG capsule Generic drug: dexlansoprazole Take 60 mg by mouth daily.   methocarbamol 500 MG tablet Commonly known as: Robaxin Take 1 tablet (500 mg total) by mouth every 8 (eight) hours as needed for muscle spasms.   multivitamin with minerals Tabs tablet Take 1 tablet by mouth 3 (three) times a week. WOMEN'S ONE-A-DAY   oxyCODONE 5 MG immediate release tablet Commonly known as: Oxy IR/ROXICODONE Take 1-2 tablets (5-10 mg total) by mouth every 4 (four) hours as needed for moderate pain.   sulfamethoxazole-trimethoprim 800-160 MG tablet Commonly known as: BACTRIM DS Take 1 tablet by mouth 2 (two) times daily.      Follow-up Information    Irene Limbo, MD In 1 week.   Specialty: Plastic Surgery Why: as scheduled Contact information: [address] [place] [address] [phone]        Donnie Mesa, MD. Schedule an appointment as soon as possible for  a visit in 2 weeks.   Specialty: General Surgery Contact information: [place] [address] [phone]           Signed: Irene Limbo 07/14/2019, 8:11 AM

## 2019-07-14 NOTE — Progress Notes (Signed)
1 Day Post-Op   Subjective/Chief Complaint: Patient is seen right after Dr. Iran Planas In good spirits. Sore in her axilla Ready to go home   Objective: Vital signs in last 24 hours: Temp:  [97.9 F (36.6 C)-99.1 F (37.3 C)] 99.1 F (37.3 C) (01/20 0815) Pulse Rate:  [68-95] 73 (01/20 0815) Resp:  [11-20] 18 (01/20 0507) BP: (91-142)/(56-93) 108/71 (01/20 0815) SpO2:  [99 %-100 %] 100 % (01/20 0815) Last BM Date: 07/13/19  Intake/Output from previous day: 01/19 0701 - 01/20 0700 In: 1992.5 [P.O.:120; I.V.:1672.5; IV Piggyback:200] Out: 280 [Drains:250; Blood:30] Intake/Output this shift: No intake/output data recorded.  General appearance: alert, cooperative and no distress Skin flaps viable; no hematoma; serosanguinous drainage  Lab Results:  Recent Labs    07/13/19 1457 07/14/19 0104  WBC 16.4* 14.6*  HGB 12.7 10.8*  HCT 36.3 32.5*  PLT 205 179   BMET Recent Labs    07/13/19 1457 07/14/19 0104  NA  --  139  K  --  3.7  CL  --  107  CO2  --  26  GLUCOSE  --  108*  BUN  --  7  CREATININE 0.74 0.84  CALCIUM  --  8.4*   PT/INR No results for input(s): LABPROT, INR in the last 72 hours. ABG No results for input(s): PHART, HCO3 in the last 72 hours.  Invalid input(s): PCO2, PO2  Studies/Results: NM Sentinel Node Inj-No Rpt (Breast)  Result Date: 07/13/2019 Sulfur colloid was injected by the nuclear medicine technologist for melanoma sentinel node.    Anti-infectives: Anti-infectives (From admission, onward)   Start     Dose/Rate Route Frequency Ordered Stop   07/14/19 0000  sulfamethoxazole-trimethoprim (BACTRIM DS) 800-160 MG tablet     1 tablet Oral 2 times daily 07/14/19 0811     07/13/19 1600  ceFAZolin (ANCEF) IVPB 1 g/50 mL premix     1 g 100 mL/hr over 30 Minutes Intravenous Every 8 hours 07/13/19 1347 07/14/19 1559   07/13/19 1400  ceFAZolin (ANCEF) IVPB 2g/100 mL premix  Status:  Discontinued     2 g 200 mL/hr over 30 Minutes  Intravenous Every 8 hours 07/13/19 1347 07/13/19 1351   07/13/19 0806  bacitracin 50,000 Units, gentamicin (GARAMYCIN) 80 mg, ceFAZolin (ANCEF) 1 g in sodium chloride 0.9 % 1,000 mL  Status:  Discontinued       As needed 07/13/19 0807 07/13/19 1052   07/13/19 0715  bacitracin 50,000 Units, gentamicin (GARAMYCIN) 80 mg, ceFAZolin (ANCEF) 1 g in sodium chloride 0.9 % 1,000 mL  Status:  Discontinued      Irrigation Once 07/13/19 0705 07/13/19 1346   07/13/19 0600  ceFAZolin (ANCEF) IVPB 2g/100 mL premix     2 g 200 mL/hr over 30 Minutes Intravenous On call to O.R. 07/13/19 0558 07/13/19 0737      Assessment/Plan: s/p Procedure(s): LEFT NIPPLE SPARING MASTECTOMY WITH SENTINEL LYMPH NODE BIOPSY (Left) LEFT BREAST RECONSTRUCTION WITH PLACEMENT OF TISSUE EXPANDER AND ALLODERM (Left) Discharge Follow-up with me in 2-3 weeks.  I will call with path results.  LOS: 0 days    Maia Petties 07/14/2019

## 2019-07-14 NOTE — Progress Notes (Addendum)
AVS given and reviewed with pt. Medications discussed. All questions answered to satisfaction. Pt verbalized understanding of information given including JP drain care. Pt to be escorted off the unit with all belongings via wheelchair by volunteer services.

## 2019-07-14 NOTE — Plan of Care (Signed)
  Problem: Education: Goal: Knowledge of General Education information will improve Description: Including pain rating scale, medication(s)/side effects and non-pharmacologic comfort measures Outcome: Progressing   Problem: Health Behavior/Discharge Planning: Goal: Ability to manage health-related needs will improve Outcome: Progressing   Problem: Clinical Measurements: Goal: Ability to maintain clinical measurements within normal limits will improve Outcome: Progressing Goal: Will remain free from infection Outcome: Progressing Goal: Respiratory complications will improve Outcome: Progressing   Problem: Activity: Goal: Risk for activity intolerance will decrease Outcome: Progressing   Problem: Nutrition: Goal: Adequate nutrition will be maintained Outcome: Progressing   Problem: Coping: Goal: Level of anxiety will decrease Outcome: Progressing   Problem: Pain Managment: Goal: General experience of comfort will improve Outcome: Progressing   Problem: Safety: Goal: Ability to remain free from injury will improve Outcome: Progressing   Problem: Skin Integrity: Goal: Risk for impaired skin integrity will decrease Outcome: Progressing

## 2019-07-16 ENCOUNTER — Encounter: Payer: Self-pay | Admitting: *Deleted

## 2019-07-19 ENCOUNTER — Encounter: Payer: Self-pay | Admitting: *Deleted

## 2019-07-19 ENCOUNTER — Telehealth: Payer: Self-pay | Admitting: *Deleted

## 2019-07-19 NOTE — Telephone Encounter (Signed)
Ordered oncotype per Dr. Feng.  Faxed requisition to pathology.   

## 2019-07-28 NOTE — H&P (Signed)
  Subjective:     Patient ID: Antoine Fiallos is a 39 y.o. female.  HPI  2 weeks post op. Norco- effective needs refill Drain < 15 ml per week.  Presented with palpable left breast mass. Diagnositic MMG/US showed mass at 1 o clock, 4 cmfn, 2.4 x 2.4 x 1.8 cm and pleomorphic calcifications in the upper-outer quadrant. A single enlarged LN noted. 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 Biopsy LN benign.  MRI showed biopsy-proven site of invasive cancer 3.3 x 2.9 x 3.3 cm with surrounding NME overall area measures up to 6.0 x 3 x 5.3 cm. NME to the level of the pectoralis muscle. Biopsy clip in the UOQ at site of biopsy-proven DCIS is > 8 cm from clip to the posterior edge of the enhancing mass/NME. An additional 7 x 5 x 5 mm mass is identified in posterior, lateral left breast, abuting the pectoralis. No abnormal appearing lymph nodes. No additional biopsy completed as plan for mastectomy.   Final pathology 3.4 cm IDC with high grade DCIS, margins clear, 0/2 SLN.  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 Oncotype pending.  Prior 34 C. Left mastectomy 355 g  Stopped vaping prior to surgery.  Works for [employer] in [place]. Lives with 2 kids, ages 64 and 55.  Review of Systems     Objective:   Physical Exam  Cardiovascular: Normal rate, regular rhythm and normal heart sounds.  Pulmonary/Chest: Effort normal and breath sounds normal.   Chest:  incisions intact, left NAC epidermolysis lateral NAC and over most LOQ to IMF incision- able to wash much of this away today and at least three areas including superior half nipple, < 1 cm area over LOQ, and just lateral to NAC 1 cm area are dark eschar concerning for partial for full thickness necrosis  Drain serous    Assessment:     Left breast ca UOQ ER+ S/p left NSM, SLN, prepectoral TE/ADM (Alloderm) reconstruction Skin flap necrosis    Plan:      Few areas skin  flap concerning for full thickness injury and recommend debridement in OR and closure. Plan to do this in 2 days. Reviewed OP surgery and will go ahead and do saline exchange while under anesthesia. I also offered to remove her drain today- she had a bit of anxiety with drain removal last week and offered to keep in until surgery and also remove while asleep. She elected for this.  Rx for Norco given and doxycycline for post op use.  Oncotype pending. Has PT scheduled 2.8.21- asked her to cancel in light of above.   Natrelle 133S FV-11-T 300 ml tissue expander placed, initial fill volume 175 ml air.

## 2019-07-29 ENCOUNTER — Other Ambulatory Visit (HOSPITAL_COMMUNITY)
Admission: RE | Admit: 2019-07-29 | Discharge: 2019-07-29 | Disposition: A | Discharge status: Home / Self Care | Payer: BC Managed Care – PPO | Source: Ambulatory Visit | Attending: Plastic Surgery | Admitting: Plastic Surgery

## 2019-07-29 ENCOUNTER — Encounter (HOSPITAL_BASED_OUTPATIENT_CLINIC_OR_DEPARTMENT_OTHER): Payer: Self-pay | Admitting: Plastic Surgery

## 2019-07-29 ENCOUNTER — Telehealth: Payer: Self-pay | Admitting: General Practice

## 2019-07-29 ENCOUNTER — Other Ambulatory Visit: Payer: Self-pay

## 2019-07-29 DIAGNOSIS — Z20822 Contact with and (suspected) exposure to covid-19: Secondary | ICD-10-CM | POA: Diagnosis not present

## 2019-07-29 DIAGNOSIS — Z01812 Encounter for preprocedural laboratory examination: Secondary | ICD-10-CM | POA: Diagnosis not present

## 2019-07-29 LAB — SARS CORONAVIRUS 2 (TAT 6-24 HRS): SARS Coronavirus 2: NEGATIVE

## 2019-07-29 NOTE — Telephone Encounter (Signed)
[place] CSW Progress Notes  Call from patient.  She is concerned about medical bills from her cancer treatment - "they just keep piling in."  Patient has been emailed information on [employer] [place] and [employer] by [employer].  Undersigned CSW emailed these resources again to patient and encouraged her to review and contact us for help in submitting these.  CSW also suggested she contact billing office to determine her patient responsibility and request financial assistance from [facility] if she needs this help.  Edwyna Shell, LCSW Clinical Social Worker Phone:  [phone] Cell:  [phone]

## 2019-07-30 ENCOUNTER — Encounter (HOSPITAL_BASED_OUTPATIENT_CLINIC_OR_DEPARTMENT_OTHER): Payer: Self-pay | Admitting: Plastic Surgery

## 2019-07-30 ENCOUNTER — Ambulatory Visit (HOSPITAL_BASED_OUTPATIENT_CLINIC_OR_DEPARTMENT_OTHER): Payer: BC Managed Care – PPO | Admitting: Certified Registered"

## 2019-07-30 ENCOUNTER — Ambulatory Visit (HOSPITAL_BASED_OUTPATIENT_CLINIC_OR_DEPARTMENT_OTHER)
Admission: RE | Admit: 2019-07-30 | Discharge: 2019-07-30 | Disposition: A | Discharge status: Home / Self Care | Payer: BC Managed Care – PPO | Attending: Plastic Surgery | Admitting: Plastic Surgery

## 2019-07-30 ENCOUNTER — Encounter (HOSPITAL_BASED_OUTPATIENT_CLINIC_OR_DEPARTMENT_OTHER)
Admission: RE | Disposition: A | Discharge status: Home / Self Care | Payer: Self-pay | Source: Home / Self Care | Attending: Plastic Surgery

## 2019-07-30 ENCOUNTER — Other Ambulatory Visit: Payer: Self-pay

## 2019-07-30 DIAGNOSIS — K219 Gastro-esophageal reflux disease without esophagitis: Secondary | ICD-10-CM | POA: Diagnosis not present

## 2019-07-30 DIAGNOSIS — Z853 Personal history of malignant neoplasm of breast: Secondary | ICD-10-CM | POA: Insufficient documentation

## 2019-07-30 DIAGNOSIS — Z87891 Personal history of nicotine dependence: Secondary | ICD-10-CM | POA: Insufficient documentation

## 2019-07-30 DIAGNOSIS — Z9012 Acquired absence of left breast and nipple: Secondary | ICD-10-CM | POA: Diagnosis not present

## 2019-07-30 DIAGNOSIS — Z79899 Other long term (current) drug therapy: Secondary | ICD-10-CM | POA: Diagnosis not present

## 2019-07-30 DIAGNOSIS — D573 Sickle-cell trait: Secondary | ICD-10-CM | POA: Insufficient documentation

## 2019-07-30 DIAGNOSIS — Y838 Other surgical procedures as the cause of abnormal reaction of the patient, or of later complication, without mention of misadventure at the time of the procedure: Secondary | ICD-10-CM | POA: Insufficient documentation

## 2019-07-30 DIAGNOSIS — T86828 Other complications of skin graft (allograft) (autograft): Secondary | ICD-10-CM | POA: Diagnosis present

## 2019-07-30 HISTORY — PX: TISSUE EXPANDER PLACEMENT: SHX2530

## 2019-07-30 HISTORY — PX: DEBRIDEMENT AND CLOSURE WOUND: SHX5614

## 2019-07-30 LAB — POCT PREGNANCY, URINE: Preg Test, Ur: NEGATIVE

## 2019-07-30 SURGERY — DEBRIDEMENT, WOUND, WITH CLOSURE
Anesthesia: General | Site: Breast | Laterality: Left

## 2019-07-30 MED ORDER — ACETAMINOPHEN 500 MG PO TABS: 1000.0000 mg | ORAL_TABLET | ORAL | Status: DC

## 2019-07-30 MED ORDER — EPHEDRINE SULFATE 50 MG/ML IJ SOLN
INTRAMUSCULAR | Status: DC | PRN
  Administered 2019-07-30: 15 mg via INTRAVENOUS

## 2019-07-30 MED ORDER — OXYCODONE HCL 5 MG PO TABS: 5.0000 mg | ORAL_TABLET | Freq: Once | ORAL | Status: DC | PRN

## 2019-07-30 MED ORDER — CELECOXIB 200 MG PO CAPS
200.0000 mg | ORAL_CAPSULE | ORAL | Status: AC
  Administered 2019-07-30: 200 mg via ORAL

## 2019-07-30 MED ORDER — CELECOXIB 200 MG PO CAPS
ORAL_CAPSULE | ORAL | Status: AC
  Filled 2019-07-30: qty 1

## 2019-07-30 MED ORDER — DEXAMETHASONE SODIUM PHOSPHATE 10 MG/ML IJ SOLN
INTRAMUSCULAR | Status: DC | PRN
  Administered 2019-07-30: 10 mg via INTRAVENOUS

## 2019-07-30 MED ORDER — CEFAZOLIN SODIUM-DEXTROSE 2-3 GM-%(50ML) IV SOLR
INTRAVENOUS | Status: DC | PRN
  Administered 2019-07-30: 2 g via INTRAVENOUS

## 2019-07-30 MED ORDER — BACITRACIN ZINC 500 UNIT/GM EX OINT
TOPICAL_OINTMENT | CUTANEOUS | Status: AC
  Filled 2019-07-30: qty 28.35

## 2019-07-30 MED ORDER — PROPOFOL 500 MG/50ML IV EMUL
INTRAVENOUS | Status: DC | PRN
  Administered 2019-07-30: 25 ug/kg/min via INTRAVENOUS

## 2019-07-30 MED ORDER — FENTANYL CITRATE (PF) 100 MCG/2ML IJ SOLN
INTRAMUSCULAR | Status: AC
  Filled 2019-07-30: qty 2

## 2019-07-30 MED ORDER — 0.9 % SODIUM CHLORIDE (POUR BTL) OPTIME
TOPICAL | Status: DC | PRN
  Administered 2019-07-30: 10:00:00 1000 mL

## 2019-07-30 MED ORDER — BACITRACIN ZINC 500 UNIT/GM EX OINT
TOPICAL_OINTMENT | CUTANEOUS | Status: DC | PRN
  Administered 2019-07-30: 1 via TOPICAL

## 2019-07-30 MED ORDER — ONDANSETRON HCL 4 MG/2ML IJ SOLN: 4.0000 mg | Freq: Once | INTRAMUSCULAR | Status: DC | PRN

## 2019-07-30 MED ORDER — ONDANSETRON HCL 4 MG/2ML IJ SOLN
INTRAMUSCULAR | Status: AC
  Filled 2019-07-30: qty 2

## 2019-07-30 MED ORDER — MIDAZOLAM HCL 2 MG/2ML IJ SOLN
INTRAMUSCULAR | Status: DC | PRN
  Administered 2019-07-30: 2 mg via INTRAVENOUS

## 2019-07-30 MED ORDER — LACTATED RINGERS IV SOLN: INTRAVENOUS | Status: DC | PRN

## 2019-07-30 MED ORDER — GABAPENTIN 300 MG PO CAPS
300.0000 mg | ORAL_CAPSULE | ORAL | Status: AC
  Administered 2019-07-30: 300 mg via ORAL

## 2019-07-30 MED ORDER — PROPOFOL 10 MG/ML IV BOLUS
INTRAVENOUS | Status: DC | PRN
  Administered 2019-07-30: 200 mg via INTRAVENOUS
  Administered 2019-07-30: 50 mg via INTRAVENOUS

## 2019-07-30 MED ORDER — CEFAZOLIN SODIUM-DEXTROSE 2-4 GM/100ML-% IV SOLN: 2.0000 g | INTRAVENOUS | Status: DC

## 2019-07-30 MED ORDER — OXYCODONE HCL 5 MG/5ML PO SOLN: 5.0000 mg | Freq: Once | ORAL | Status: DC | PRN

## 2019-07-30 MED ORDER — CEFAZOLIN SODIUM-DEXTROSE 2-4 GM/100ML-% IV SOLN
INTRAVENOUS | Status: AC
  Filled 2019-07-30: qty 100

## 2019-07-30 MED ORDER — LACTATED RINGERS IV SOLN: INTRAVENOUS | Status: DC

## 2019-07-30 MED ORDER — MIDAZOLAM HCL 2 MG/2ML IJ SOLN
INTRAMUSCULAR | Status: AC
  Filled 2019-07-30: qty 2

## 2019-07-30 MED ORDER — FENTANYL CITRATE (PF) 100 MCG/2ML IJ SOLN
INTRAMUSCULAR | Status: DC | PRN
  Administered 2019-07-30 (×2): 50 ug via INTRAVENOUS
  Administered 2019-07-30: 100 ug via INTRAVENOUS

## 2019-07-30 MED ORDER — BACITRACIN ZINC 500 UNIT/GM EX OINT
TOPICAL_OINTMENT | CUTANEOUS | Status: AC
  Filled 2019-07-30: qty 0.9

## 2019-07-30 MED ORDER — FENTANYL CITRATE (PF) 100 MCG/2ML IJ SOLN: 25.0000 ug | INTRAMUSCULAR | Status: DC | PRN

## 2019-07-30 MED ORDER — ACETAMINOPHEN 500 MG PO TABS
ORAL_TABLET | ORAL | Status: AC
  Filled 2019-07-30: qty 2

## 2019-07-30 MED ORDER — LIDOCAINE HCL (CARDIAC) PF 100 MG/5ML IV SOSY
PREFILLED_SYRINGE | INTRAVENOUS | Status: DC | PRN
  Administered 2019-07-30: 100 mg via INTRAVENOUS

## 2019-07-30 MED ORDER — GABAPENTIN 300 MG PO CAPS
ORAL_CAPSULE | ORAL | Status: AC
  Filled 2019-07-30: qty 1

## 2019-07-30 SURGICAL SUPPLY — 80 items
BAG DECANTER FOR FLEXI CONT (MISCELLANEOUS) IMPLANT
BENZOIN TINCTURE PRP APPL 2/3 (GAUZE/BANDAGES/DRESSINGS) IMPLANT
BINDER BREAST LRG (GAUZE/BANDAGES/DRESSINGS) IMPLANT
BINDER BREAST MEDIUM (GAUZE/BANDAGES/DRESSINGS) ×2 IMPLANT
BINDER BREAST XLRG (GAUZE/BANDAGES/DRESSINGS) IMPLANT
BINDER BREAST XXLRG (GAUZE/BANDAGES/DRESSINGS) IMPLANT
BLADE CLIPPER SURG (BLADE) IMPLANT
BLADE SURG 10 STRL SS (BLADE) ×2 IMPLANT
BLADE SURG 15 STRL LF DISP TIS (BLADE) IMPLANT
BLADE SURG 15 STRL SS (BLADE)
BNDG GAUZE ELAST 4 BULKY (GAUZE/BANDAGES/DRESSINGS) IMPLANT
CANISTER SUCT 1200ML W/VALVE (MISCELLANEOUS) ×2 IMPLANT
CHLORAPREP W/TINT 26 (MISCELLANEOUS) ×2 IMPLANT
COVER BACK TABLE 60X90IN (DRAPES) ×2 IMPLANT
COVER MAYO STAND STRL (DRAPES) ×2 IMPLANT
COVER WAND RF STERILE (DRAPES) IMPLANT
DECANTER SPIKE VIAL GLASS SM (MISCELLANEOUS) IMPLANT
DERMABOND ADVANCED (GAUZE/BANDAGES/DRESSINGS) ×1
DERMABOND ADVANCED .7 DNX12 (GAUZE/BANDAGES/DRESSINGS) ×1 IMPLANT
DRAIN CHANNEL 15F RND FF W/TCR (WOUND CARE) IMPLANT
DRAIN CHANNEL 19F RND (DRAIN) IMPLANT
DRAPE LAPAROSCOPIC ABDOMINAL (DRAPES) IMPLANT
DRAPE TOP ARMCOVERS (MISCELLANEOUS) ×2 IMPLANT
DRAPE U-SHAPE 76X120 STRL (DRAPES) ×2 IMPLANT
DRAPE UTILITY XL STRL (DRAPES) ×2 IMPLANT
DRSG PAD ABDOMINAL 8X10 ST (GAUZE/BANDAGES/DRESSINGS) ×2 IMPLANT
ELECT BLADE 4.0 EZ CLEAN MEGAD (MISCELLANEOUS)
ELECT COATED BLADE 2.86 ST (ELECTRODE) ×2 IMPLANT
ELECT REM PT RETURN 9FT ADLT (ELECTROSURGICAL) ×2
ELECT REM PT RETURN 9FT PED (ELECTROSURGICAL)
ELECTRODE BLDE 4.0 EZ CLN MEGD (MISCELLANEOUS) IMPLANT
ELECTRODE REM PT RETRN 9FT PED (ELECTROSURGICAL) IMPLANT
ELECTRODE REM PT RTRN 9FT ADLT (ELECTROSURGICAL) ×1 IMPLANT
EVACUATOR SILICONE 100CC (DRAIN) IMPLANT
GAUZE SPONGE 4X4 12PLY STRL LF (GAUZE/BANDAGES/DRESSINGS) IMPLANT
GLOVE BIO SURGEON STRL SZ 6 (GLOVE) ×4 IMPLANT
GLOVE BIO SURGEON STRL SZ 6.5 (GLOVE) IMPLANT
GLOVE BIO SURGEON STRL SZ7 (GLOVE) ×2 IMPLANT
GLOVE BIOGEL PI IND STRL 6.5 (GLOVE) IMPLANT
GLOVE BIOGEL PI IND STRL 7.0 (GLOVE) ×1 IMPLANT
GLOVE BIOGEL PI IND STRL 7.5 (GLOVE) ×1 IMPLANT
GLOVE BIOGEL PI INDICATOR 6.5 (GLOVE)
GLOVE BIOGEL PI INDICATOR 7.0 (GLOVE) ×1
GLOVE BIOGEL PI INDICATOR 7.5 (GLOVE) ×1
GOWN STRL REUS W/ TWL LRG LVL3 (GOWN DISPOSABLE) ×2 IMPLANT
GOWN STRL REUS W/TWL LRG LVL3 (GOWN DISPOSABLE) ×2
IV NS 500ML (IV SOLUTION) ×1
IV NS 500ML BAXH (IV SOLUTION) ×1 IMPLANT
KIT FILL SYSTEM UNIVERSAL (SET/KITS/TRAYS/PACK) IMPLANT
NEEDLE HYPO 25X1 1.5 SAFETY (NEEDLE) IMPLANT
NEEDLE PRECISIONGLIDE 27X1.5 (NEEDLE) IMPLANT
NS IRRIG 1000ML POUR BTL (IV SOLUTION) ×2 IMPLANT
PACK BASIN DAY SURGERY FS (CUSTOM PROCEDURE TRAY) ×2 IMPLANT
PENCIL SMOKE EVACUATOR (MISCELLANEOUS) ×2 IMPLANT
PIN SAFETY STERILE (MISCELLANEOUS) IMPLANT
SHEET MEDIUM DRAPE 40X70 STRL (DRAPES) ×4 IMPLANT
SLEEVE SCD COMPRESS KNEE MED (MISCELLANEOUS) ×2 IMPLANT
SPONGE LAP 18X18 RF (DISPOSABLE) ×4 IMPLANT
STAPLER VISISTAT 35W (STAPLE) ×2 IMPLANT
STRIP CLOSURE SKIN 1/2X4 (GAUZE/BANDAGES/DRESSINGS) IMPLANT
SUCTION FRAZIER HANDLE 10FR (MISCELLANEOUS)
SUCTION TUBE FRAZIER 10FR DISP (MISCELLANEOUS) IMPLANT
SUT CHROMIC 4 0 PS 2 18 (SUTURE) IMPLANT
SUT ETHILON 2 0 FS 18 (SUTURE) IMPLANT
SUT ETHILON 4 0 PS 2 18 (SUTURE) IMPLANT
SUT ETHILON 5 0 P 3 18 (SUTURE)
SUT MNCRL AB 4-0 PS2 18 (SUTURE) ×2 IMPLANT
SUT NYLON ETHILON 5-0 P-3 1X18 (SUTURE) IMPLANT
SUT PDS AB 2-0 CT2 27 (SUTURE) IMPLANT
SUT PLAIN 5 0 P 3 18 (SUTURE) IMPLANT
SUT VIC AB 3-0 SH 27 (SUTURE) ×1
SUT VIC AB 3-0 SH 27X BRD (SUTURE) ×1 IMPLANT
SUT VICRYL 4-0 PS2 18IN ABS (SUTURE) IMPLANT
SYR BULB IRRIGATION 50ML (SYRINGE) ×2 IMPLANT
SYR CONTROL 10ML LL (SYRINGE) IMPLANT
TOWEL GREEN STERILE FF (TOWEL DISPOSABLE) ×2 IMPLANT
TRAY DSU PREP LF (CUSTOM PROCEDURE TRAY) IMPLANT
TUBE CONNECTING 20X1/4 (TUBING) ×2 IMPLANT
UNDERPAD 30X36 HEAVY ABSORB (UNDERPADS AND DIAPERS) ×4 IMPLANT
YANKAUER SUCT BULB TIP NO VENT (SUCTIONS) ×2 IMPLANT

## 2019-07-30 NOTE — Anesthesia Postprocedure Evaluation (Signed)
Anesthesia Post Note  Patient: Radio producer  Procedure(s) Performed: DEBRIDEMENT LEFT MASTECTOMY FLAP (Left Breast) TISSUE EXPANSION LEFT CHEST (Left Breast)     Patient location during evaluation: PACU Anesthesia Type: General Level of consciousness: awake and alert Pain management: pain level controlled Vital Signs Assessment: post-procedure vital signs reviewed and stable Respiratory status: spontaneous breathing, nonlabored ventilation and respiratory function stable Cardiovascular status: blood pressure returned to baseline and stable Postop Assessment: no apparent nausea or vomiting Anesthetic complications: no    Last Vitals:  Vitals:   07/30/19 1045 07/30/19 1100  BP: 106/70 125/78  Pulse: (!) 116 (!) 101  Resp: 17 20  Temp:  36.9 C  SpO2: 100% 100%    Last Pain:  Vitals:   07/30/19 1100  TempSrc:   PainSc: 0-No pain                 Lidia Collum

## 2019-07-30 NOTE — Transfer of Care (Signed)
Immediate Anesthesia Transfer of Care Note  Patient: Victoria Barker  Procedure(s) Performed: DEBRIDEMENT LEFT MASTECTOMY FLAP (Left Breast) TISSUE EXPANSION LEFT CHEST (Left Breast)  Patient Location: PACU  Anesthesia Type:General  Level of Consciousness: awake, alert  and oriented  Airway & Oxygen Therapy: Patient Spontanous Breathing and Patient connected to face mask oxygen  Post-op Assessment: Report given to RN and Post -op Vital signs reviewed and stable  Post vital signs: Reviewed and stable  Last Vitals:  Vitals Value Taken Time  BP 108/66 07/30/19 1015  Temp    Pulse 98 07/30/19 1018  Resp 18 07/30/19 1018  SpO2 100 % 07/30/19 1018  Vitals shown include unvalidated device data.  Last Pain:  Vitals:   07/30/19 0755  TempSrc: Oral  PainSc: 0-No pain      Patients Stated Pain Goal: 4 ([id number])  Complications: No apparent anesthesia complications

## 2019-07-30 NOTE — Anesthesia Preprocedure Evaluation (Addendum)
Anesthesia Evaluation  Patient identified by MRN, date of birth, ID band Patient awake    Reviewed: Allergy & Precautions, NPO status , Patient's Chart, lab work & pertinent test results  History of Anesthesia Complications Negative for: history of anesthetic complications  Airway Mallampati: I  TM Distance: >3 FB Neck ROM: Full    Dental  (+) Teeth Intact   Pulmonary neg pulmonary ROS, former smoker,    Pulmonary exam normal        Cardiovascular negative cardio ROS Normal cardiovascular exam     Neuro/Psych negative neurological ROS  negative psych ROS   GI/Hepatic Neg liver ROS, GERD  ,  Endo/Other  negative endocrine ROS  Renal/GU negative Renal ROS  negative genitourinary   Musculoskeletal negative musculoskeletal ROS (+)   Abdominal   Peds  Hematology negative hematology ROS (+) Sickle cell trait ,   Anesthesia Other Findings Breast cancer  Reproductive/Obstetrics                            Anesthesia Physical Anesthesia Plan  ASA: II  Anesthesia Plan: General   Post-op Pain Management:    Induction: Intravenous  PONV Risk Score and Plan: 3 and Ondansetron, Dexamethasone, Treatment may vary due to age or medical condition and Midazolam  Airway Management Planned: LMA  Additional Equipment: None  Intra-op Plan:   Post-operative Plan: Extubation in OR  Informed Consent: I have reviewed the patients History and Physical, chart, labs and discussed the procedure including the risks, benefits and alternatives for the proposed anesthesia with the patient or authorized representative who has indicated his/her understanding and acceptance.     Dental advisory given  Plan Discussed with:   Anesthesia Plan Comments:        Anesthesia Quick Evaluation

## 2019-07-30 NOTE — Discharge Instructions (Signed)
  Post Anesthesia Home Care Instructions  Activity: Get plenty of rest for the remainder of the day. A responsible individual must stay with you for 24 hours following the procedure.  For the next 24 hours, DO NOT: -Drive a car -Paediatric nurse -Drink alcoholic beverages -Take any medication unless instructed by your physician -Make any legal decisions or sign important papers.  Meals: Start with liquid foods such as gelatin or soup. Progress to regular foods as tolerated. Avoid greasy, spicy, heavy foods. If nausea and/or vomiting occur, drink only clear liquids until the nausea and/or vomiting subsides. Call your physician if vomiting continues.  Special Instructions/Symptoms: Your throat may feel dry or sore from the anesthesia or the breathing tube placed in your throat during surgery. If this causes discomfort, gargle with warm salt water. The discomfort should disappear within 24 hours.  If you had a scopolamine patch placed behind your ear for the management of post- operative nausea and/or vomiting:  1. The medication in the patch is effective for 72 hours, after which it should be removed.  Wrap patch in a tissue and discard in the trash. Wash hands thoroughly with soap and water. 2. You may remove the patch earlier than 72 hours if you experience unpleasant side effects which may include dry mouth, dizziness or visual disturbances. 3. Avoid touching the patch. Wash your hands with soap and water after contact with the patch.     No ibuprofen until after 4pm today.

## 2019-07-30 NOTE — Op Note (Signed)
Operative Note   DATE OF OPERATION: 2.5.21  LOCATION: [facility]-outpatient  SURGICAL DIVISION: Plastic Surgery  PREOPERATIVE DIAGNOSES:  1. 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 Excisional debridement left mastectomy flap (Excision skin subcutaneous tissue 1.5 x 3 cm) 2. Tissue expansion left chest  SURGEON: Irene Limbo MD MBA  ASSISTANT: none  ANESTHESIA:  General.   EBL: < 30 ml  COMPLICATIONS: None immediate.   INDICATIONS FOR PROCEDURE:  The patient, Victoria Barker, is a 39 y.o. female born on 04-05-1981, is here for debridement left mastectomy flap following left nipple sparing mastectomy and immediate prepectoral expander acellular dermis reconstruction.   FINDINGS: Remaining JP drain removed prior to procedure. Full thickness dry eschar involving half left nipple areola and mastectomy flap lateral to this excised. Superficial eschar removed from IMF scar and lower pole flap. Skin flaps adherent to underlying acellular dermis with exception area beneath excised eschar.   DESCRIPTION OF PROCEDURE:  The patient's operative site was marked with the patient in the preoperative area. The patient was taken to the operating room. SCDs were placed and IV antibiotics were given. The patient's operative site was prepped and draped in a sterile fashion. A time out was performed and all information was confirmed to be correct. Tangential excision all areas for dry eschar completed until bleeding tissue encountered. As noted above, over IMF incision and lower outer mastectomy flap this represented superficial injury. Simple closure inframammary fold wound completed with 4-0 monocryl running locking. Full thickness necrosis noted of 1/2 nipple and lateral areola and lateral mastectomy flap with exposure underlying ADM. This represented area 1.5 x 3 cm. Wound irrigated with saline Betadine solution.  Closure completed with interrupted 3-0 monocryl in dermis. Skin closure completed with 4-0 monocryl interrupted and short running simple suture, length closure 3 cm.   Port identified and accessed. Air from expander removed. Total 220 ml saline infiltrated.   Dermabond applied to repair. Dry dressing and breast binder applied.   The patient was allowed to wake from anesthesia, extubated and taken to the recovery room in satisfactory condition.   SPECIMENS: left mastectomy flap debridement  DRAINS: none

## 2019-07-30 NOTE — Interval H&P Note (Signed)
History and Physical Interval Note:  07/30/2019 7:59 AM  Victoria Barker  has presented today for surgery, with the diagnosis of left breast cancer UOQ ER positive, acquired absence breast, skin flap necrosis.  The various methods of treatment have been discussed with the patient and family. After consideration of risks, benefits and other options for treatment, the patient has consented to  Procedure(s): DEBRIDEMENT LEFT MASTECTOMY FLAP (Left) TISSUE EXPANSION LEFT CHEST (Left) as a surgical intervention.  The patient's history has been reviewed, patient examined, no change in status, stable for surgery.  I have reviewed the patient's chart and labs.  Questions were answered to the patient's satisfaction.     Arnoldo Hooker [name]

## 2019-07-30 NOTE — Anesthesia Procedure Notes (Signed)
Procedure Name: LMA Insertion Performed by: Bionca Mckey M, CRNA Pre-anesthesia Checklist: Patient identified, Emergency Drugs available, Suction available and Patient being monitored Patient Re-evaluated:Patient Re-evaluated prior to induction Oxygen Delivery Method: Circle system utilized Preoxygenation: Pre-oxygenation with 100% oxygen Induction Type: IV induction Ventilation: Mask ventilation without difficulty LMA: LMA inserted LMA Size: 4.0 Tube type: Oral Number of attempts: 1 Airway Equipment and Method: Bite block Placement Confirmation: positive ETCO2 Tube secured with: Tape Dental Injury: Teeth and Oropharynx as per pre-operative assessment        

## 2019-08-02 ENCOUNTER — Encounter: Payer: Self-pay | Admitting: Physical Therapy

## 2019-08-02 ENCOUNTER — Encounter: Payer: BC Managed Care – PPO | Admitting: Physical Therapy

## 2019-08-02 ENCOUNTER — Ambulatory Visit: Payer: Self-pay | Admitting: Surgery

## 2019-08-02 LAB — SURGICAL PATHOLOGY

## 2019-08-03 ENCOUNTER — Encounter: Payer: Self-pay | Admitting: *Deleted

## 2019-08-04 ENCOUNTER — Encounter: Payer: Self-pay | Admitting: Physical Therapy

## 2019-08-05 ENCOUNTER — Encounter: Payer: Self-pay | Admitting: *Deleted

## 2019-08-05 ENCOUNTER — Encounter: Payer: Self-pay | Admitting: Licensed Clinical Social Worker

## 2019-08-05 ENCOUNTER — Inpatient Hospital Stay: Payer: BC Managed Care – PPO | Attending: Hematology | Admitting: Licensed Clinical Social Worker

## 2019-08-05 ENCOUNTER — Other Ambulatory Visit: Payer: Self-pay

## 2019-08-05 DIAGNOSIS — F17290 Nicotine dependence, other tobacco product, uncomplicated: Secondary | ICD-10-CM | POA: Insufficient documentation

## 2019-08-05 DIAGNOSIS — K219 Gastro-esophageal reflux disease without esophagitis: Secondary | ICD-10-CM | POA: Insufficient documentation

## 2019-08-05 DIAGNOSIS — Z170 Estrogen receptor positive status [ER+]: Secondary | ICD-10-CM | POA: Insufficient documentation

## 2019-08-05 DIAGNOSIS — C50412 Malignant neoplasm of upper-outer quadrant of left female breast: Secondary | ICD-10-CM | POA: Insufficient documentation

## 2019-08-05 DIAGNOSIS — Z9012 Acquired absence of left breast and nipple: Secondary | ICD-10-CM | POA: Insufficient documentation

## 2019-08-05 NOTE — Progress Notes (Signed)
[place] Work Follow Up  Holiday representative met with patient at support services to assist with completion of applications for financial assistance from [place] the [employer]. Patient not currently undergoing active treatment so will wait on submitting [place]. [place] application completed, letter written, and submitted.  Ms. Vanderheyden reports receiving assistance with transportation from the [place]. Otherwise she is doing well. She has support from her 17yo daughter and 43yo son. Currently on leave from [place].     Clinical Social Worker will follow up on application to [employer] if Ms. Gittens has to undergo any radiation or chemotherapy    Lailany Enoch E, LCSW

## 2019-08-06 ENCOUNTER — Encounter: Payer: Self-pay | Admitting: *Deleted

## 2019-08-09 ENCOUNTER — Encounter: Payer: Self-pay | Admitting: Physical Therapy

## 2019-08-09 ENCOUNTER — Encounter: Payer: Self-pay | Admitting: *Deleted

## 2019-08-09 NOTE — Progress Notes (Signed)
Called [facility] to receive update on Oncotype Testing report. Notified that they are waiting on pre-authorization from insurance company in order to release results. Billing at [facility] will reach out to pre-auth department to receive an update.

## 2019-08-12 ENCOUNTER — Telehealth: Payer: Self-pay | Admitting: *Deleted

## 2019-08-12 NOTE — Telephone Encounter (Signed)
Received oncotype results of 56/>39%.  Message sent to the team.

## 2019-08-12 NOTE — Telephone Encounter (Signed)
Spoke with patient and confirmed appointment for 08/20/19 at 12:40 to discuss oncotype results.

## 2019-08-12 NOTE — Telephone Encounter (Signed)
Call placed to [facility] again regarding oncotype testing results. Was notified they are still waiting on pre-authorization from insurance company. Was informed [place] will reach out to insurance company today for f/u

## 2019-08-13 ENCOUNTER — Other Ambulatory Visit: Payer: Self-pay

## 2019-08-16 ENCOUNTER — Ambulatory Visit: Payer: BC Managed Care – PPO | Attending: Surgery | Admitting: Physical Therapy

## 2019-08-16 ENCOUNTER — Encounter: Payer: Self-pay | Admitting: Physical Therapy

## 2019-08-16 ENCOUNTER — Other Ambulatory Visit: Payer: Self-pay

## 2019-08-16 DIAGNOSIS — R293 Abnormal posture: Secondary | ICD-10-CM | POA: Diagnosis present

## 2019-08-16 DIAGNOSIS — C50412 Malignant neoplasm of upper-outer quadrant of left female breast: Secondary | ICD-10-CM

## 2019-08-16 DIAGNOSIS — Z483 Aftercare following surgery for neoplasm: Secondary | ICD-10-CM

## 2019-08-16 DIAGNOSIS — Z170 Estrogen receptor positive status [ER+]: Secondary | ICD-10-CM | POA: Diagnosis present

## 2019-08-16 DIAGNOSIS — M25612 Stiffness of left shoulder, not elsewhere classified: Secondary | ICD-10-CM | POA: Diagnosis present

## 2019-08-16 NOTE — Patient Instructions (Signed)
Flexion (Eccentric) - Active (Cane)    YOU CAN DO THIS LAYING ON YOUR BACK. Lift cane with both hands. Avoid hiking shoulders. Lower cane slowly for 3-5 seconds. _10__ reps per set, __2_ sets per day, _7_ days per week.  http://ecce.exer.us/155   Copyright  VHI. All rights reserved.  Cane Exercise: Abduction    Hold cane with right hand over end, palm-up, with other hand palm-down. Move arm out from side and up by pushing with other arm. Hold __2__ seconds. Repeat __10__ times. Do ___2_ sessions per day.  http://gt2.exer.us/82   Copyright  VHI. All rights reserved.

## 2019-08-16 NOTE — Therapy (Signed)
[place], [place], [address] Phone: [phone]   Fax:  [phone]  Physical Therapy Treatment  Patient Details  Name: Victoria Barker MRN: [id number] Date of Birth: December 01, 1980 Referring Provider (PT): Dr. Donnie Mesa   Encounter Date: 08/16/2019  PT End of Session - 08/16/19 1556    Visit Number  2    Number of Visits  10    Date for PT Re-Evaluation  09/13/19    PT Start Time  1505    PT Stop Time  1554    PT Time Calculation (min)  49 min    Activity Tolerance  Patient tolerated treatment well    Behavior During Therapy  [facility] for tasks assessed/performed       Past Medical History:  Diagnosis Date  . Cancer ([place])    Left breast Ca  . GERD (gastroesophageal reflux disease)   . Scoliosis   . Sickle cell trait [facility])     Past Surgical History:  Procedure Laterality Date  . BREAST RECONSTRUCTION WITH PLACEMENT OF TISSUE EXPANDER AND ALLODERM Left 07/13/2019   Procedure: LEFT BREAST RECONSTRUCTION WITH PLACEMENT OF TISSUE EXPANDER AND ALLODERM;  Surgeon: Irene Limbo, MD;  Location: [place];  Service: Plastics;  Laterality: Left;  . DEBRIDEMENT AND CLOSURE WOUND Left 07/30/2019   Procedure: DEBRIDEMENT LEFT MASTECTOMY FLAP;  Surgeon: Irene Limbo, MD;  Location: [place];  Service: Plastics;  Laterality: Left;  . INDUCED ABORTION  2017  . NIPPLE SPARING MASTECTOMY WITH SENTINEL LYMPH NODE BIOPSY Left 07/13/2019   Procedure: LEFT NIPPLE SPARING MASTECTOMY WITH SENTINEL LYMPH NODE BIOPSY;  Surgeon: Donnie Mesa, MD;  Location: [place];  Service: General;  Laterality: Left;  . TISSUE EXPANDER PLACEMENT Left 07/30/2019   Procedure: TISSUE EXPANSION LEFT CHEST;  Surgeon: Irene Limbo, MD;  Location: [place];  Service: Plastics;  Laterality: Left;    There were no vitals filed for this visit.  Subjective Assessment - 08/16/19 1509    Subjective  Patient  reports she underwent a left mastectomy and sentinel node biopsy on 07/13/2019 with a left tissue expander placed. She had 1 negative axillary lymph node removed. She has not been told her Oncotype results yet.    Pertinent History  Patient was diagnosed on 04/21/2019 with left invasive ductal carcinoma breast cancer. It is ER positive / PR negative and HER2 negative with a Ki67 of 35%. She underwent a left mastectomy and sentinel node biopsy on 07/13/2019 with a left tissue expander placed. She had 1 negative axillary lymph node removed. She had to have a tissue debridement done to the tissue flap on 07/30/2019. She has not been told her Oncotype results yet.    Patient Stated Goals  Get my arm moving better    Currently in Pain?  Yes    Pain Score  4     Pain Location  Chest    Pain Orientation  Left;Lateral    Pain Descriptors / Indicators  Tender    Pain Type  Surgical pain    Pain Onset  More than a month ago    Pain Frequency  Intermittent    Aggravating Factors   Nothing    Pain Relieving Factors  Nothing         [facility] PT Assessment - 08/16/19 0001      Assessment   Medical Diagnosis  s/p left mastectomy and sentinel node biopsy    Referring Provider (PT)  Dr. Rodman Key  [place]    Onset Date/Surgical Date  07/13/19    Hand Dominance  Right    Prior Therapy  Baselines      Precautions   Precautions  Other (comment)    Precaution Comments  Left arm lymphedema risk; recent surgery      Restrictions   Weight Bearing Restrictions  No      Balance Screen   Has the patient fallen in the past 6 months  No    Has the patient had a decrease in activity level because of a fear of falling?   No    Is the patient reluctant to leave their home because of a fear of falling?   No      Home Environment   Living Environment  Private residence    Living Arrangements  Children   46 year old; sometimes 13 year old and 30 month old granddaug   Available Help at Discharge  Family      Prior  Function   Level of Independence  Independent    Vocation  Part time employment    Vocation Requirements  Not currently working    Leisure  Walking some      Cognition   Overall Cognitive Status  Within [place] for tasks assessed      Observation/Other Assessments   Observations  Breast incisions are healing but has some scabby areas present. She had a debridement done last week and those areas are still healing. Mild edema present in left breast.      Posture/Postural Control   Posture/Postural Control  Postural limitations    Postural Limitations  Rounded Shoulders;Forward head      ROM / Strength   AROM / PROM / Strength  AROM      AROM   AROM Assessment Site  Shoulder    Right/Left Shoulder  Left    Left Shoulder Extension  51 Degrees    Left Shoulder Flexion  110 Degrees    Left Shoulder ABduction  132 Degrees    Left Shoulder Internal Rotation  64 Degrees    Left Shoulder External Rotation  69 Degrees      Strength   Overall Strength  Unable to assess;Due to precautions        LYMPHEDEMA/ONCOLOGY QUESTIONNAIRE - 08/16/19 1518      Type   Cancer Type  Left breast cancer      Surgeries   Mastectomy Date  07/13/19    Sentinel Lymph Node Biopsy Date  07/13/19    Number Lymph Nodes Removed  1      Treatment   Active Chemotherapy Treatment  No    Past Chemotherapy Treatment  No    Active Radiation Treatment  No    Past Radiation Treatment  No    Current Hormone Treatment  No    Past Hormone Therapy  No      What other symptoms do you have   Are you Having Heaviness or Tightness  Yes    Are you having Pain  Yes    Are you having pitting edema  No    Is it Hard or Difficult finding clothes that fit  No    Do you have infections  No    Is there Decreased scar mobility  Yes    Stemmer Sign  No      Right Upper Extremity Lymphedema   10 cm Proximal to Olecranon Process  24.2 cm    Olecranon Process  21 cm    10 cm Proximal to Ulnar Styloid Process   21.7 cm    Just Proximal to Ulnar Styloid Process  14.3 cm    Across Hand at [employer]  18.3 cm    At [place] of 2nd Digit  5.5 cm      Left Upper Extremity Lymphedema   10 cm Proximal to Olecranon Process  23.7 cm    Olecranon Process  21.2 cm    10 cm Proximal to Ulnar Styloid Process  20 cm    Just Proximal to Ulnar Styloid Process  14.4 cm    Across Hand at [employer]  17.8 cm    At [place] of 2nd Digit  5.6 cm        Quick Dash - 08/16/19 0001    Open a tight or new jar  Moderate difficulty    Do heavy household chores (wash walls, wash floors)  Mild difficulty    Carry a shopping bag or briefcase  Mild difficulty    Wash your back  Mild difficulty    Use a knife to cut food  Mild difficulty    Recreational activities in which you take some force or impact through your arm, shoulder, or hand (golf, hammering, tennis)  Mild difficulty    During the past week, to what extent has your arm, shoulder or hand problem interfered with your normal social activities with family, friends, neighbors, or groups?  Slightly    During the past week, to what extent has your arm, shoulder or hand problem limited your work or other regular daily activities  Slightly    Arm, shoulder, or hand pain.  Moderate    Tingling (pins and needles) in your arm, shoulder, or hand  Moderate    Difficulty Sleeping  Mild difficulty    DASH Score  31.82 %             [facility] Adult PT Treatment/Exercise - 08/16/19 0001      Exercises   Exercises  Shoulder      Shoulder Exercises: Pulleys   Flexion  3 minutes    Flexion Limitations  Limited to pt tolerance and no pain allowed             PT Education - 08/16/19 1538    Education Details  SUPINE CANE EXERCISES    Person(s) Educated  Patient    Methods  Explanation;Demonstration;Handout    Comprehension  Returned demonstration;Verbalized understanding          PT Long Term Goals - 08/16/19 1602      PT LONG TERM GOAL #1   Title   Patient will demonstrate she has regained full shoulder ROM and function post operatively compared to baselines.    Time  4    Period  Weeks    Status  On-going    Target Date  09/13/19      PT LONG TERM GOAL #2   Title  Patient will increase left shoulder to >/= 140 degrees flexion for increased ease reaching.    Baseline  150 pre-op; 110 post op    Time  4    Period  Weeks    Status  New    Target Date  09/13/19      PT LONG TERM GOAL #3   Title  Patient will increase left shoulder to >/= 160 degrees abduction for increased ease reaching.    Baseline  132 post op; 175 pre-op  Time  4    Period  Weeks    Status  New    Target Date  09/13/19      PT LONG TERM GOAL #4   Title  Patient will improve DASH score to be </= 15 for increased overall left arm function.    Baseline  31.82 post op; 0 pre-op    Time  4    Period  Weeks    Target Date  09/13/19      PT LONG TERM GOAL #5   Title  Patient will be able to verbalize good understanding of lymphedema risk reduction practices.    Time  4    Period  Weeks    Status  New    Target Date  09/13/19            Plan - 08/16/19 1557    Clinical Impression Statement  Patient is doing well s/p left mastectomy and sentinel node biopsy on 07/13/2019. She had 1 negative node removed and expanders placed for reconstruction. She underwent a debridement surgery on 07/30/2019 due to some necrotic areas on her breast but reports she is healing well from that. Her shoulder ROM is significantly limited and she is limited functionally, but there are no signs of lymphedema. She will benefit from PT to regain shoulder ROM and function. She has not received information from  her doctor about her Oncotype score yet but reports she has an appointment 07/20/2019 with Dr. Burr Medico.    Rehab Potential  Excellent    PT Frequency  2x / week    PT Duration  4 weeks    PT Treatment/Interventions  ADLs/Self Care Home Management;Therapeutic  exercise;Patient/family education;Manual techniques;Manual lymph drainage;Therapeutic activities;Passive range of motion;Scar mobilization    PT Next Visit Plan  Pulleys, PROM, gentle ROM exercises as tolerated    PT Home Exercise Plan  Post op shoulder ROM HEP, supine cane flexion and abduction    Consulted and Agree with Plan of Care  Patient       Patient will benefit from skilled therapeutic intervention in order to improve the following deficits and impairments:  Postural dysfunction, Decreased range of motion, Decreased knowledge of precautions, Impaired UE functional use, Pain, Increased fascial restricitons, Decreased strength, Increased edema, Decreased scar mobility  Visit Diagnosis: Malignant neoplasm of upper-outer quadrant of left breast in female, estrogen receptor positive ([place]) - Plan: PT plan of care cert/re-cert  Abnormal posture - Plan: PT plan of care cert/re-cert  Aftercare following surgery for neoplasm - Plan: PT plan of care cert/re-cert  Stiffness of left shoulder, not elsewhere classified - Plan: PT plan of care cert/re-cert     Problem List Patient Active Problem List   Diagnosis Date Noted  . Invasive ductal carcinoma of breast, female, left ([place]) 07/13/2019  . Genetic testing 05/28/2019  . Malignant neoplasm of upper-outer quadrant of left breast in female, estrogen receptor positive ([place]) 04/30/2019   Annia Friendly, PT 08/16/19 4:07 PM  [place], [place], [address] Phone: [phone]   Fax:  [phone]  Name: Victoria Barker MRN: [id number] Date of Birth: September 24, 1980

## 2019-08-17 ENCOUNTER — Encounter: Payer: Self-pay | Admitting: *Deleted

## 2019-08-17 ENCOUNTER — Encounter: Payer: Self-pay | Admitting: Hematology

## 2019-08-17 LAB — SURGICAL PATHOLOGY

## 2019-08-18 NOTE — Progress Notes (Signed)
[place]   [phone] [phone]) [phone]   Clinic Follow up Note   Patient Care Team: [name], MD as PCP - General (Obstetrics and Gynecology) Mauro Kaufmann, RN as Oncology Nurse Navigator Rockwell Germany, RN as Oncology Nurse Navigator Donnie Mesa, MD as Consulting Physician (General Surgery) Truitt Merle, MD as Consulting Physician (Hematology) Eppie Gibson, MD as Attending Physician (Radiation Oncology)  Date of Service:  08/20/2019  CHIEF COMPLAINT: F/u of left breast cancer   SUMMARY OF ONCOLOGIC HISTORY: Oncology History  Malignant neoplasm of upper-outer quadrant of left breast in female, estrogen receptor positive ([place])  04/21/2019 Mammogram   Diagnostic mammogram 04/21/19  IMPRESSION 1. findings highly suspicious for left breast cancer. There is a palpable irregular mass 4cm from nipple measuring 2.4x2.4x1.8cm in the 1:00 position axis of the left breast and pleomorphic  calcifications in the upper-outer quadrant as described above.  2. single left 0.9x0.5cm axillary lymph node with a mild/borderline thickened cortex.    04/28/2019 Cancer Staging   Staging form: Breast, AJCC 8th Edition - Clinical stage from 04/28/2019: Stage IIA (cT2, cN0, cM0, G2, ER+, PR-, HER2: Equivocal) - Signed by Truitt Merle, MD on 05/04/2019   04/28/2019 Initial Biopsy   Diagnosis 04/28/19 1. Breast, left, needle core biopsy, 1 o'clock position - INVASIVE DUCTAL CARCINOMA. - DUCTAL CARCINOMA IN SITU. - SEE COMMENT. 2. Lymph node, needle/core biopsy, left axilla - THERE IS NO EVIDENCE OF CARCINOMA IN 1 OF 1 LYMPH NODE (0/1). 3. Breast, left, needle core biopsy, upper, outer quadrant - DUCTAL CARCINOMA IN SITU WITH CALCIFICATIONS. - SEE COMMENT.   04/28/2019 Receptors her2   1. PROGNOSTIC INDICATORS Results: HER2 negative  Estrogen Receptor: 80%, POSITIVE, STRONG STAINING INTENSITY Progesterone Receptor: 0%, NEGATIVE Proliferation Marker Ki67:  35%    04/30/2019 Initial Diagnosis   Malignant neoplasm of upper-outer quadrant of left breast in female, estrogen receptor positive ([name])    Genetic Testing   No pathogenic variants identified. 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 The final report date is 05/28/2019.  The STAT Breast cancer panel offered by [facility] includes sequencing and rearrangement analysis for the following 9 genes:  ATM, BRCA1, BRCA2, CDH1, CHEK2, PALB2, PTEN, STK11 and TP53.    The Common Hereditary Cancers Panel offered by [facility] includes sequencing and/or deletion duplication testing of the following 48 genes: APC, ATM, AXIN2, BARD1, BMPR1A, BRCA1, BRCA2, BRIP1, CDH1, CDKN2A (p14ARF), CDKN2A (p16INK4a), CKD4, CHEK2, CTNNA1, DICER1, EPCAM (Deletion/duplication testing only), GREM1 (promoter region deletion/duplication testing only), KIT, MEN1, MLH1, MSH2, MSH3, MSH6, MUTYH, NBN, NF1, NHTL1, PALB2, PDGFRA, PMS2, POLD1, POLE, PTEN, RAD50, RAD51C, RAD51D, RNF43, SDHB, SDHC, SDHD, SMAD4, SMARCA4. STK11, TP53, TSC1, TSC2, and VHL.  The following genes were evaluated for sequence changes only: SDHA and HOXB13 c.251G>A variant only.   05/11/2019 Breast MRI   IMPRESSION: 1. Enhancing mass and architectural distortion spanning approximately 6 x 3 x 5.3 cm in the upper outer left breast consistent with the patient's biopsy-proven site of invasive cancer. 2. Biopsy changes in the anterior left breast at the site of the patient's biopsy-proven ductal carcinoma in situ. 3. Suspicious changes within the upper-outer quadrant of the left breast spanning a total of 8 cm in the AP dimension. This includes the site of biopsy-proven DCIS anteriorly and suspicious enhancement extending to the level of the pectoralis muscle posteriorly. 4. Suspicious 7 mm enhancing mass in the far posterior slightly lateral left breast (  image 138/212). 5. No MRI evidence of  malignancy on the right. 6. No suspicious lymphadenopathy.     07/13/2019 Surgery   LEFT NIPPLE SPARING MASTECTOMY WITH SENTINEL LYMPH NODE BIOPSY and LEFT BREAST RECONSTRUCTION WITH PLACEMENT OF TISSUE EXPANDER AND ALLODERM by D.r Tsuie and Dr Iran Planas.    07/13/2019 Pathology Results   SURGICAL PATHOLOGY   FINAL MICROSCOPIC DIAGNOSIS:   A. LYMPH NODE, LEFT AXILLARY #1, SENTINEL, BIOPSY:  - One of one lymph nodes negative for carcinoma (0/1).   B. BREAST, LEFT, MASTECTOMY:  - Invasive ductal carcinoma, grade 3, spanning 3.4 cm.  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  - Invasive carcinoma is 0.2 cm from the posterior margin focally (not on  ink) and 0.4 cm from the  anterior margin focally (not on ink).  - In situ carcinoma is 0.4 cm from the posterior margin focally (not on  ink).  - Biopsy site x2.  - See oncology table.   C. NIPPLE, LEFT, BIOPSY:  - Benign nipple tissue.   D. LYMPH NODE, LEFT AXILLARY #2, SENTINEL, BIOPSY:  - One of one lymph nodes negative for carcinoma (0/1).     GROUP 1:  HER2 **POSITIVE**    07/13/2019 Oncotype testing   Ocotype score 56 with 39% risk of recurrence with Tmaoxifen or AI alone. There is a 15% benefit from adjuvant chemo.     07/13/2019 Cancer Staging   Staging form: Breast, AJCC 8th Edition - Pathologic stage from 07/13/2019: Stage IIA (pT2, pN0, cM0, G3, ER+, PR-, HER2+, Oncotype DX score: 56) - Signed by Truitt Merle, MD on 08/20/2019    Chemotherapy   TCHP (Taxol, Carboplatin, Herceptin, Perjeta) q3weeks for 6 cycles starting 08/30/19, followed by anti-HER2 maintenance therapy with Herceptin q3weeks to complete 1 year of treatment.        CURRENT THERAPY:  [place] q3weeks for 6 cycles starting 08/30/19, followed by maintenance Herceptin to complete one year therapy    INTERVAL HISTORY:  [name] is here for a follow up post surgery. She presents to the clinic alone. She notes she did reconstruction with her  Mastectomy and then debridement from reconstruction. She notes her surgery went well and has mild tenderness at incision site. She still has tissue expander in place. She is currently doing PT given her limited ROM. She kept her draining tube for 2 weeks. She will f/u with Dr. Prince Solian in 3 months. Given she has to get ready for chemo and recovering from surgery she would like to wait on her return to work.     REVIEW OF SYSTEMS:   Constitutional: Denies fevers, chills or abnormal weight loss Eyes: Denies blurriness of vision Ears, nose, mouth, throat, and face: Denies mucositis or sore throat Respiratory: Denies cough, dyspnea or wheezes Cardiovascular: Denies palpitation, chest discomfort or lower extremity swelling Gastrointestinal:  Denies nausea, heartburn or change in bowel habits Skin: Denies abnormal skin rashes Lymphatics: Denies new lymphadenopathy or easy bruising Neurological:Denies numbness, tingling or new weaknesses Behavioral/Psych: Mood is stable, no new changes  Breast: (+) Left breast tenderness at incision and left arm limited ROM All other systems were reviewed with the patient and are negative.  MEDICAL HISTORY:  Past Medical History:  Diagnosis Date  . Cancer ([place])    Left breast Ca  . GERD (gastroesophageal reflux disease)   . Scoliosis   . Sickle cell trait ([place])     SURGICAL HISTORY: Past Surgical History:  Procedure Laterality Date  . BREAST RECONSTRUCTION WITH  PLACEMENT OF TISSUE EXPANDER AND ALLODERM Left 07/13/2019   Procedure: LEFT BREAST RECONSTRUCTION WITH PLACEMENT OF TISSUE EXPANDER AND ALLODERM;  Surgeon: Irene Limbo, MD;  Location: [facility];  Service: Plastics;  Laterality: Left;  . DEBRIDEMENT AND CLOSURE WOUND Left 07/30/2019   Procedure: DEBRIDEMENT LEFT MASTECTOMY FLAP;  Surgeon: Irene Limbo, MD;  Location: [place];  Service: Plastics;  Laterality: Left;  . INDUCED ABORTION  2017  . NIPPLE SPARING MASTECTOMY WITH  SENTINEL LYMPH NODE BIOPSY Left 07/13/2019   Procedure: LEFT NIPPLE SPARING MASTECTOMY WITH SENTINEL LYMPH NODE BIOPSY;  Surgeon: Donnie Mesa, MD;  Location: [place];  Service: General;  Laterality: Left;  . TISSUE EXPANDER PLACEMENT Left 07/30/2019   Procedure: TISSUE EXPANSION LEFT CHEST;  Surgeon: Irene Limbo, MD;  Location: [facility];  Service: Plastics;  Laterality: Left;    I have reviewed the social history and family history with the patient and they are unchanged from previous note.  ALLERGIES:  is allergic to bactrim [sulfamethoxazole-trimethoprim].  MEDICATIONS:  Current Outpatient Medications  Medication Sig Dispense Refill  . dexlansoprazole (DEXILANT) 60 MG capsule Take 60 mg by mouth daily.    . Lactobacillus-Inulin ([place] PO) Take 1 capsule by mouth daily as needed (digestive health.).     Marland Kitchen Multiple Vitamin (MULTIVITAMIN WITH MINERALS) TABS tablet Take 1 tablet by mouth 3 (three) times a week. WOMEN'S ONE-A-DAY    . HYDROcodone-acetaminophen (NORCO/VICODIN) 5-325 MG tablet Take 1 tablet by mouth every 6 (six) hours as needed for moderate pain.     No current facility-administered medications for this visit.    PHYSICAL EXAMINATION: ECOG PERFORMANCE STATUS: 1 - Symptomatic but completely ambulatory  Vitals:   08/20/19 1301  BP: (!) 141/81  Pulse: 93  Resp: 18  Temp: 98.7 F (37.1 C)  SpO2: 100%   Filed Weights   08/20/19 1301  Weight: 118 lb 3.2 oz (53.6 kg)    GENERAL:alert, no distress and comfortable SKIN: skin color, texture, turgor are normal, no rashes or significant lesions EYES: normal, Conjunctiva are pink and non-injected, sclera clear  NECK: supple, thyroid normal size, non-tender, without nodularity LYMPH:  no palpable lymphadenopathy in the cervical, axillary  LUNGS: clear to auscultation and percussion with normal breathing effort HEART: regular rate & rhythm and no murmurs and no lower extremity  edema ABDOMEN:abdomen soft, non-tender and normal bowel sounds Musculoskeletal:no cyanosis of digits and no clubbing  NEURO: alert & oriented x 3 with fluent speech, no focal motor/sensory deficits BREAST: s/p left mastectomy and reconstruction with tissue expander: surgical incision healed well, with pickish skin above incision.  No palpable mass, nodules or adenopathy bilaterally. Breast exam benign.   LABORATORY DATA:  I have reviewed the data as listed CBC Latest Ref Rng & Units 07/14/2019 07/13/2019 07/09/2019  WBC 4.0 - 10.5 K/uL 14.6(H) 16.4(H) 7.2  Hemoglobin 12.0 - 15.0 g/dL 10.8(L) 12.7 13.7  Hematocrit 36.0 - 46.0 % 32.5(L) 36.3 40.7  Platelets 150 - 400 K/uL 179 205 209     CMP Latest Ref Rng & Units 07/14/2019 07/13/2019 07/09/2019  Glucose 70 - 99 mg/dL 108(H) - 99  BUN 6 - 20 mg/dL 7 - 10  Creatinine 0.44 - 1.00 mg/dL 0.84 0.74 0.85  Sodium 135 - 145 mmol/L 139 - 137  Potassium 3.5 - 5.1 mmol/L 3.7 - 4.1  Chloride 98 - 111 mmol/L 107 - 106  CO2 22 - 32 mmol/L 26 - 23  Calcium 8.9 - 10.3 mg/dL 8.4(L) -  9.1  Total Protein 6.5 - 8.1 g/dL - - -  Total Bilirubin 0.3 - 1.2 mg/dL - - -  Alkaline Phos 38 - 126 U/L - - -  AST 15 - 41 U/L - - -  ALT 0 - 44 U/L - - -      RADIOGRAPHIC STUDIES: I have personally reviewed the radiological images as listed and agreed with the findings in the report. No results found.   ASSESSMENT & PLAN:  Victoria Barker is a 39 y.o. female with    1 Malignant neoplasm of upper-outer quadrant of left breast, invasive ductal carcinoma and DCIS, stage IIA, pT2N0M0, ER+/PR-/HER2+, Grade III -She was diagnosed in 04/2019. She was found to have 2 left breast masses with DCIS and 1 mass with Invasive ductal carcinoma, her abnormal lymph node was biopsied and found to be negative. -Her invasive tumor was ER positive, PR negative, HER-2 equivocal by IHC, negative by [facility] on initial biopsy  -On 07/13/19 she underwent left breast mastectomy with Dr.  Prince Solian and reconstruction with Dr. Iran Planas. We discussed her path which showed complete surgical resection of her 3.4cm IDC with clear margins and node negative. I requested to repeat HER2 of her surgical sample, it was equivocal by IHC, and positive by FISH.  We discussed HER-2 positive breast cancer is more aggressive and predicts high risk feature for cancer recurrence.  -I also discussed her Oncotpye testing (was ordered before we repeated HER2 test) results with recurrence score of 56 and 39% risk of recurrence with Tamoxifen or AI alone.  This is consistent with aggressive nature of HER-2 positive breast cancer. -Given her high risk features of Grade III, HER2 positive disease and RS of 56 I recommend adjuvant chemotherapy to reduce her high risk of recurrence. She would also benefit from Antiestrogen therapy to reduce risk of distant recurrence. Given she is s/p mastectomy and node negative she does not need adjuvant RT. After chemo she can proceed with Breast reconstruction.  -I discussed with stage II disease I recommend CT CAP and bone scan to further evaluate for metastasis. She is agreeable.  Given her young age, I recommend more intensive chemotherapy with TCH (Taxol, Carboplatin, Herceptin) q3weeks for 6 cycles, followed by anti-HER2 maintenance therapy with Herceptin q3weeks to complete 1 year of treatment. Given negative node, there is noi benefit of adding Perjeta so I do not recommend, based on the APHINITY trail data (NEJM 2017; 377: 122-131)  --Chemotherapy consent: Side effects including but does not limited to, fatigue, nausea, vomiting, diarrhea, hair loss, neuropathy, fluid retention, renal and kidney dysfunction, neutropenic fever, needed for blood transfusion, bleeding, reversible cardiomyopathy, skin rash, were discussed with patient in great detail. She agrees to proceed. -The goal of therapy is curative -We also briefly discussed antiestrogen therapy, including ovarian  suppression, to reduce her risk of breast cancer recurrence. -We will check with Dr. Isidore Moos to see if she needs postmastectomy radiation. -Given risk of heart failure from Herceptin will obtain baseline ECHO and repeat periodically while she is on it.  -Given hair loss from chemo I discussed option of Dignicap to reduce loss of her hair. I reviewed possible financial help is available and an informational about this. She is interested.  -I discussed chemo can cause possible irreversible infertility. She notes she does not want anymore children and is not very interested in storing her eggs. She will use contraception barriers with sex.  -Plan to start second week of March. Before starting she will have  chemo education class. I also recommend PAC placement given she will have long term IV treatment. She is agreeable.  -I encouraged her to proceed with COVID19 vaccination before chemo starts if she can. I also recommend Flu shot, she declined for today.  -f/u with first cycle chemo   2. Nicotine use, Smoking Cessation   -She smoked cigarettes for 2 years, less than 1/2ppd. She currently smokes jewel pod containing nicotine for the past 3 years.  -Smoking cessation has been reviewed with her. She is willing to quit.   3. Acid Reflux -Well controlled on Dexilant   PLAN:  -CT CAP and bone scan and ECHO in 1-2 weeks  -Chemo education class in 1-2 weeks  -PAC placement by Dr. Georgette Dover or IR in 1-2 weeks  -Lab, flush, f/u and Chemo TCH the week of 3/8, with GCSF on day 2. I called in dexamethasone, zofran and compazine today    No problem-specific Assessment & Plan notes found for this encounter.   Orders Placed This Encounter  Procedures  . CT Abdomen Pelvis W Contrast    Standing Status:   Future    Standing Expiration Date:   08/19/2020    Order Specific Question:   If indicated for the ordered procedure, I authorize the administration of contrast media per Radiology protocol    Answer:    Yes    Order Specific Question:   Is patient pregnant?    Answer:   No    Order Specific Question:   Preferred imaging location?    Answer:   [facility]    Order Specific Question:   Release to patient    Answer:   Immediate    Order Specific Question:   Is Oral Contrast requested for this exam?    Answer:   Yes, Per Radiology protocol    Order Specific Question:   Radiology Contrast Protocol - do NOT remove file path    Answer:   \\[email or web address].pdf  . CT Chest W Contrast    Standing Status:   Future    Standing Expiration Date:   08/19/2020    Order Specific Question:   If indicated for the ordered procedure, I authorize the administration of contrast media per Radiology protocol    Answer:   Yes    Order Specific Question:   Is patient pregnant?    Answer:   No    Order Specific Question:   Preferred imaging location?    Answer:   [facility]    Order Specific Question:   Radiology Contrast Protocol - do NOT remove file path    Answer:   \\[email or web address].pdf  . NM Bone Scan Whole Body    Standing Status:   Future    Standing Expiration Date:   08/19/2020    Order Specific Question:   If indicated for the ordered procedure, I authorize the administration of a radiopharmaceutical per Radiology protocol    Answer:   Yes    Order Specific Question:   Is the patient pregnant?    Answer:   No    Order Specific Question:   Preferred imaging location?    Answer:   [facility]    Order Specific Question:   Radiology Contrast Protocol - do NOT remove file path    Answer:   \\[email or web address]  . CBC with Differential ([place] Only)    Standing Status:   Standing    Number of Occurrences:   100  Standing Expiration Date:   08/19/2024  . CMP ([place] only)    Standing Status:   Standing    Number of Occurrences:   100    Standing Expiration Date:   08/19/2024  . Pregnancy,  urine    Standing Status:   Standing    Number of Occurrences:   100    Standing Expiration Date:   08/19/2024  . ECHOCARDIOGRAM COMPLETE    Standing Status:   Future    Standing Expiration Date:   11/16/2020    Order Specific Question:   Where should this test be performed    Answer:   [place]    Order Specific Question:   Perflutren DEFINITY (image enhancing agent) should be administered unless hypersensitivity or allergy exist    Answer:   Administer Perflutren    Order Specific Question:   Is a special reader required? (athlete or structural heart)    Answer:   No    Order Specific Question:   Reason for exam-Echo    Answer:   Chemotherapy evaluation  v87.41 / v58.11   All questions were answered. The patient knows to call the clinic with any problems, questions or concerns. No barriers to learning was detected. The total time spent in the appointment was 45 minutes.     Truitt Merle, MD 08/20/2019   I, Joslyn Devon, am acting as scribe for Truitt Merle, MD.   I have reviewed the above documentation for accuracy and completeness, and I agree with the above.

## 2019-08-20 ENCOUNTER — Inpatient Hospital Stay (HOSPITAL_BASED_OUTPATIENT_CLINIC_OR_DEPARTMENT_OTHER): Payer: BC Managed Care – PPO | Admitting: Hematology

## 2019-08-20 ENCOUNTER — Telehealth: Payer: Self-pay | Admitting: *Deleted

## 2019-08-20 ENCOUNTER — Ambulatory Visit: Payer: BC Managed Care – PPO

## 2019-08-20 ENCOUNTER — Encounter: Payer: Self-pay | Admitting: Hematology

## 2019-08-20 ENCOUNTER — Other Ambulatory Visit: Payer: Self-pay

## 2019-08-20 VITALS — BP 141/81 | HR 93 | Temp 98.7°F | Resp 18 | Ht 62.0 in | Wt 118.2 lb

## 2019-08-20 DIAGNOSIS — C50912 Malignant neoplasm of unspecified site of left female breast: Secondary | ICD-10-CM | POA: Diagnosis not present

## 2019-08-20 DIAGNOSIS — Z483 Aftercare following surgery for neoplasm: Secondary | ICD-10-CM

## 2019-08-20 DIAGNOSIS — R293 Abnormal posture: Secondary | ICD-10-CM

## 2019-08-20 DIAGNOSIS — C50412 Malignant neoplasm of upper-outer quadrant of left female breast: Secondary | ICD-10-CM | POA: Diagnosis present

## 2019-08-20 DIAGNOSIS — K219 Gastro-esophageal reflux disease without esophagitis: Secondary | ICD-10-CM | POA: Diagnosis not present

## 2019-08-20 DIAGNOSIS — Z170 Estrogen receptor positive status [ER+]: Secondary | ICD-10-CM

## 2019-08-20 DIAGNOSIS — M25612 Stiffness of left shoulder, not elsewhere classified: Secondary | ICD-10-CM

## 2019-08-20 DIAGNOSIS — Z9012 Acquired absence of left breast and nipple: Secondary | ICD-10-CM | POA: Diagnosis not present

## 2019-08-20 DIAGNOSIS — F17290 Nicotine dependence, other tobacco product, uncomplicated: Secondary | ICD-10-CM | POA: Diagnosis not present

## 2019-08-20 MED ORDER — PROCHLORPERAZINE MALEATE 10 MG PO TABS: 10.0000 mg | ORAL_TABLET | Freq: Four times a day (QID) | ORAL | 1 refills | Status: DC | PRN

## 2019-08-20 MED ORDER — DEXAMETHASONE 4 MG PO TABS: 4.0000 mg | ORAL_TABLET | Freq: Two times a day (BID) | ORAL | 1 refills | Status: DC

## 2019-08-20 MED ORDER — ONDANSETRON HCL 8 MG PO TABS: 8.0000 mg | ORAL_TABLET | Freq: Two times a day (BID) | ORAL | 1 refills | Status: DC | PRN

## 2019-08-20 NOTE — Telephone Encounter (Signed)
Spoke with patient to follow up from her appointment.  Was able to get patient scheduled for her CT/bone scan, echo, chemo class for 3/3.  Confirmed appointment times and instructions with the patient.  Contact information given and encouraged her to call with any questions or concerns. Patient verbalize understanding.

## 2019-08-20 NOTE — Patient Instructions (Signed)
Access Code: [email or web address]  URL: [email or web address]  Date: 08/20/2019  Prepared by: Tomma Rakers   Exercises Seated Scapular Retraction - 10 reps - 1 sets - 2x daily - 7x weekly Shoulder Flexion Wall Slide with Towel - 10 reps - 1 sets - 2x daily                            - 7x weekly Supine Shoulder Press with Dowel - 10 reps - 1 sets - 2x daily - 7x weekly Supine Shoulder Flexion Extension AAROM with Dowel - 10 reps - 1 sets - 2x daily - 7x weekly Supine Chest Stretch with Elbows Bent - 10 reps - 1 sets - L hand behind head bring your elbow up toward the ceiling then down toward the bed without any pain repeat 10x. hold - 2x daily - 7x weekly

## 2019-08-20 NOTE — Progress Notes (Signed)
START ON PATHWAY REGIMEN - Breast     A cycle is every 21 days:     Docetaxel      Carboplatin      Trastuzumab-xxxx      Trastuzumab-xxxx      Pertuzumab      Pertuzumab   **Always confirm dose/schedule in your pharmacy ordering system**  Patient Characteristics: Postoperative without Neoadjuvant Therapy (Pathologic Staging), Invasive Disease, Adjuvant Therapy, HER2 Positive, ER Positive, Node Negative, pT2, pN0, Tumor Size >  3 cm Therapeutic Status: Postoperative without Neoadjuvant Therapy (Pathologic Staging) AJCC Grade: G3 AJCC N Category: pN0 AJCC M Category: cM0 ER Status: Positive (+) AJCC 8 Stage Grouping: IIA HER2 Status: Positive (+) Oncotype Dx Recurrence Score: Not Appropriate AJCC T Category: pT2 PR Status: Negative (-) Intent of Therapy: Curative Intent, Discussed with Patient

## 2019-08-20 NOTE — Therapy (Signed)
[place], [place], [address] Phone: [phone]   Fax:  [phone]  Physical Therapy Treatment  Patient Details  Name: Victoria Barker MRN: [id number] Date of Birth: [birth year] Referring Provider (PT): Dr. Donnie Mesa   Encounter Date: 08/20/2019  PT End of Session - 08/20/19 1021    Visit Number  3    Number of Visits  10    Date for PT Re-Evaluation  09/13/19    PT Start Time  1018    PT Stop Time  1103    PT Time Calculation (min)  45 min    Activity Tolerance  Patient tolerated treatment well    Behavior During Therapy  [facility] for tasks assessed/performed       Past Medical History:  Diagnosis Date  . Cancer ([place])    Left breast Ca  . GERD (gastroesophageal reflux disease)   . Scoliosis   . Sickle cell trait [facility])     Past Surgical History:  Procedure Laterality Date  . BREAST RECONSTRUCTION WITH PLACEMENT OF TISSUE EXPANDER AND ALLODERM Left 07/13/2019   Procedure: LEFT BREAST RECONSTRUCTION WITH PLACEMENT OF TISSUE EXPANDER AND ALLODERM;  Surgeon: Irene Limbo, MD;  Location: [place];  Service: Plastics;  Laterality: Left;  . DEBRIDEMENT AND CLOSURE WOUND Left 07/30/2019   Procedure: DEBRIDEMENT LEFT MASTECTOMY FLAP;  Surgeon: Irene Limbo, MD;  Location: [place];  Service: Plastics;  Laterality: Left;  . INDUCED ABORTION  2017  . NIPPLE SPARING MASTECTOMY WITH SENTINEL LYMPH NODE BIOPSY Left 07/13/2019   Procedure: LEFT NIPPLE SPARING MASTECTOMY WITH SENTINEL LYMPH NODE BIOPSY;  Surgeon: Donnie Mesa, MD;  Location: [place];  Service: General;  Laterality: Left;  . TISSUE EXPANDER PLACEMENT Left 07/30/2019   Procedure: TISSUE EXPANSION LEFT CHEST;  Surgeon: Irene Limbo, MD;  Location: [place];  Service: Plastics;  Laterality: Left;    There were no vitals filed for this visit.  Subjective Assessment - 08/20/19 1021    Subjective  Pt  reports that she is tight and feels stiff but is not necessarily feeling pain. She has been doing her exercises at home but has been scared to lift her arm very high due to pulling    Pertinent History  Patient was diagnosed on 04/21/2019 with left invasive ductal carcinoma breast cancer. It is ER positive / PR negative and HER2 negative with a Ki67 of 35%. She underwent a left mastectomy and sentinel node biopsy on 07/13/2019 with a left tissue expander placed. She had 1 negative axillary lymph node removed. She had to have a tissue debridement done to the tissue flap on 07/30/2019. She has not been told her Oncotype results yet.    Patient Stated Goals  Get my arm moving better    Currently in Pain?  No/denies    Pain Score  0-No pain                       [facility] Adult PT Treatment/Exercise - 08/20/19 0001      Shoulder Exercises: Supine   External Rotation  AROM;Left;10 reps    External Rotation Limitations  in ER/abduction with hand behind head adduction/abdcution w/tactile cueing for placement and VC to avoid pain; pt had slight increase in pain on first repetition that decrease along with ROM with increased repetitions.     Flexion  AAROM;Both;10 reps    Flexion Limitations  VC for correct movement and to avoid pain  only feel very light stretching to decrease risk of scarring.     Other Supine Exercises  Chest press w/demonstration and VC to avoid pain; pt was pain-free throughout this motion      Shoulder Exercises: Seated   Retraction  AROM;10 reps    Retraction Limitations  seated scapular retraction w/demonstration and VC to avoid pain/significant stretching in the chest wall.       Shoulder Exercises: Standing   Flexion  AAROM;Left;10 reps    Flexion Limitations  on wall without hold emphasis on avoiding pain and significant stretching.       Manual Therapy   Manual Therapy  Myofascial release;Soft tissue mobilization;Manual Lymphatic Drainage (MLD);Passive ROM     Soft tissue mobilization  tightness/tenderness noted in the L triceps and wing of latissimus dorsi (during ROM); decreased moderately following STM w/improved P/ROM into flexion    Myofascial Release  alont the L lateral trunk wall during abduction due to pt reporting burning; buring sensation completely dissipated following light longitudinal myofascial release toward the pelvis.     Manual Lymphatic Drainage (MLD)  In supine: modified inguinal nodes, then axillo-inguinal anastomosis for STM     Passive ROM  P/ROM into flexion, external rotation and abduction with intermittent STM/myofascial release thoughout for improvement and decreased pain.              PT Education - 08/20/19 1103    Education Details  Access Code: [email or web address], pt was educated on AA/ROM exercise sheet, emphasis on avoiding pain and only feeling light stretching, Discussed pt guarding of her shouler and how this causes tight musculature    Person(s) Educated  Patient    Methods  Explanation;Demonstration;Verbal cues;Tactile cues;Handout    Comprehension  Verbalized understanding;Returned demonstration          PT Long Term Goals - 08/16/19 1602      PT LONG TERM GOAL #1   Title  Patient will demonstrate she has regained full shoulder ROM and function post operatively compared to baselines.    Time  4    Period  Weeks    Status  On-going    Target Date  09/13/19      PT LONG TERM GOAL #2   Title  Patient will increase left shoulder to >/= 140 degrees flexion for increased ease reaching.    Baseline  150 pre-op; 110 post op    Time  4    Period  Weeks    Status  New    Target Date  09/13/19      PT LONG TERM GOAL #3   Title  Patient will increase left shoulder to >/= 160 degrees abduction for increased ease reaching.    Baseline  132 post op; 175 pre-op    Time  4    Period  Weeks    Status  New    Target Date  09/13/19      PT LONG TERM GOAL #4   Title  Patient will improve DASH score to be </= 15  for increased overall left arm function.    Baseline  31.82 post op; 0 pre-op    Time  4    Period  Weeks    Target Date  09/13/19      PT LONG TERM GOAL #5   Title  Patient will be able to verbalize good understanding of lymphedema risk reduction practices.    Time  4    Period  Weeks    Status  New    Target Date  09/13/19            Plan - 08/20/19 1021    Clinical Impression Statement  Pt presents to physical therapy with palpable tightness/tenderness noted in her L triceps and latissimus dorsi during P/ROMand AA/ROM due to reports of pulling/increased pain; decreased following light STM. Modified MLD was performed along the L axillo-inguinal anastomosis to move fluid away from masectomy side. Pt was provided w/easy AA/ROM activities this session with an emphasis on education to avoid pain. P/ROM was performed w/myofascial release and STM intermittently to decrease pain where pt stated she felt discomfort during P/ROM with good results and significant improvement in P/ROM.  Pt will benefit from continued POC at this time.    Rehab Potential  Excellent    PT Frequency  2x / week    PT Duration  4 weeks    PT Treatment/Interventions  ADLs/Self Care Home Management;Therapeutic exercise;Patient/family education;Manual techniques;Manual lymph drainage;Therapeutic activities;Passive range of motion;Scar mobilization    PT Next Visit Plan  Continue with STM, myofascial, AA/ROM, Pulleys, PROM, gentle ROM exercises as tolerated    PT Home Exercise Plan  Post op shoulder ROM HEP, supine cane flexion and abduction    Consulted and Agree with Plan of Care  Patient       Patient will benefit from skilled therapeutic intervention in order to improve the following deficits and impairments:  Postural dysfunction, Decreased range of motion, Decreased knowledge of precautions, Impaired UE functional use, Pain, Increased fascial restricitons, Decreased strength, Increased edema, Decreased scar  mobility  Visit Diagnosis: Malignant neoplasm of upper-outer quadrant of left breast in female, estrogen receptor positive ([name])  Abnormal posture  Aftercare following surgery for neoplasm  Stiffness of left shoulder, not elsewhere classified     Problem List Patient Active Problem List   Diagnosis Date Noted  . Invasive ductal carcinoma of breast, female, left ([place]) 07/13/2019  . Genetic testing 05/28/2019  . Malignant neoplasm of upper-outer quadrant of left breast in female, estrogen receptor positive ([place]) 04/30/2019    Ander Purpura, PT 08/20/2019, 11:08 AM  [place], [place], [address] Phone: [phone]   Fax:  [phone]  Name: Victoria Barker MRN: [id number] Date of Birth: 04-May-1981

## 2019-08-21 ENCOUNTER — Ambulatory Visit: Payer: Self-pay | Admitting: Surgery

## 2019-08-23 ENCOUNTER — Telehealth: Payer: Self-pay | Admitting: Hematology

## 2019-08-23 ENCOUNTER — Encounter: Payer: Self-pay | Admitting: Licensed Clinical Social Worker

## 2019-08-23 ENCOUNTER — Telehealth: Payer: Self-pay

## 2019-08-23 NOTE — Telephone Encounter (Signed)
Scheduled appt per 2/26 los.  Spoke with pt, and she stated she has my chart and will check her appts on mychart.

## 2019-08-23 NOTE — Telephone Encounter (Signed)
Return TC to Pt. Pt had a question  about the dignicap. Pt states that she is going to order her dignicap today or tomorrow and if she doesn't receive it, she wants to reschedule because she doesn't want to receive treatment without it. Informed Pt to order the dignicap and if she doesn't  receive it by the 10 th to give a call to the office.

## 2019-08-25 ENCOUNTER — Encounter (HOSPITAL_COMMUNITY)
Admission: RE | Admit: 2019-08-25 | Discharge: 2019-08-25 | Disposition: A | Discharge status: Home / Self Care | Payer: BC Managed Care – PPO | Source: Ambulatory Visit | Attending: Hematology | Admitting: Hematology

## 2019-08-25 ENCOUNTER — Ambulatory Visit (HOSPITAL_BASED_OUTPATIENT_CLINIC_OR_DEPARTMENT_OTHER)
Admission: RE | Admit: 2019-08-25 | Discharge: 2019-08-25 | Disposition: A | Discharge status: Home / Self Care | Payer: BC Managed Care – PPO | Source: Ambulatory Visit | Attending: Hematology | Admitting: Hematology

## 2019-08-25 ENCOUNTER — Encounter (HOSPITAL_BASED_OUTPATIENT_CLINIC_OR_DEPARTMENT_OTHER): Payer: Self-pay | Admitting: Surgery

## 2019-08-25 ENCOUNTER — Other Ambulatory Visit: Payer: Self-pay

## 2019-08-25 ENCOUNTER — Encounter: Payer: Self-pay | Admitting: *Deleted

## 2019-08-25 ENCOUNTER — Ambulatory Visit (HOSPITAL_COMMUNITY)
Admission: RE | Admit: 2019-08-25 | Discharge: 2019-08-25 | Disposition: A | Discharge status: Home / Self Care | Payer: BC Managed Care – PPO | Source: Ambulatory Visit | Attending: Hematology | Admitting: Hematology

## 2019-08-25 ENCOUNTER — Ambulatory Visit (HOSPITAL_COMMUNITY): Payer: BC Managed Care – PPO

## 2019-08-25 ENCOUNTER — Inpatient Hospital Stay: Payer: BC Managed Care – PPO | Attending: Hematology

## 2019-08-25 DIAGNOSIS — Z170 Estrogen receptor positive status [ER+]: Secondary | ICD-10-CM

## 2019-08-25 DIAGNOSIS — R000 Tachycardia, unspecified: Secondary | ICD-10-CM | POA: Insufficient documentation

## 2019-08-25 DIAGNOSIS — C50412 Malignant neoplasm of upper-outer quadrant of left female breast: Secondary | ICD-10-CM | POA: Diagnosis not present

## 2019-08-25 DIAGNOSIS — Z5112 Encounter for antineoplastic immunotherapy: Secondary | ICD-10-CM | POA: Insufficient documentation

## 2019-08-25 DIAGNOSIS — Z5189 Encounter for other specified aftercare: Secondary | ICD-10-CM | POA: Insufficient documentation

## 2019-08-25 DIAGNOSIS — Z5111 Encounter for antineoplastic chemotherapy: Secondary | ICD-10-CM | POA: Insufficient documentation

## 2019-08-25 MED ORDER — TECHNETIUM TC 99M MEDRONATE IV KIT
19.2000 | PACK | Freq: Once | INTRAVENOUS | Status: AC
  Administered 2019-08-25: 09:00:00 19.2 via INTRAVENOUS

## 2019-08-25 MED ORDER — SODIUM CHLORIDE (PF) 0.9 % IJ SOLN
INTRAMUSCULAR | Status: AC
  Filled 2019-08-25: qty 50

## 2019-08-25 MED ORDER — LIDOCAINE-PRILOCAINE 2.5-2.5 % EX CREA: 1.0000 "application " | TOPICAL_CREAM | CUTANEOUS | 0 refills | Status: DC | PRN

## 2019-08-25 MED ORDER — IOHEXOL 300 MG/ML  SOLN
100.0000 mL | Freq: Once | INTRAMUSCULAR | Status: AC | PRN
  Administered 2019-08-25: 100 mL via INTRAVENOUS

## 2019-08-25 NOTE — Progress Notes (Signed)
  Echocardiogram 2D Echocardiogram has been performed.  Darlina Sicilian M 08/25/2019, 11:02 AM

## 2019-08-26 ENCOUNTER — Inpatient Hospital Stay: Payer: BC Managed Care – PPO | Admitting: Licensed Clinical Social Worker

## 2019-08-26 ENCOUNTER — Other Ambulatory Visit: Payer: Self-pay

## 2019-08-26 ENCOUNTER — Ambulatory Visit: Payer: BC Managed Care – PPO | Attending: Surgery

## 2019-08-26 DIAGNOSIS — Z170 Estrogen receptor positive status [ER+]: Secondary | ICD-10-CM | POA: Diagnosis present

## 2019-08-26 DIAGNOSIS — R293 Abnormal posture: Secondary | ICD-10-CM | POA: Insufficient documentation

## 2019-08-26 DIAGNOSIS — C50412 Malignant neoplasm of upper-outer quadrant of left female breast: Secondary | ICD-10-CM | POA: Insufficient documentation

## 2019-08-26 DIAGNOSIS — M25612 Stiffness of left shoulder, not elsewhere classified: Secondary | ICD-10-CM | POA: Insufficient documentation

## 2019-08-26 DIAGNOSIS — Z483 Aftercare following surgery for neoplasm: Secondary | ICD-10-CM

## 2019-08-26 NOTE — Therapy (Addendum)
[place], [place], [address] Phone: [phone]   Fax:  [phone]  Physical Therapy Treatment  Patient Details  Name: Victoria Barker MRN: [id number] Date of Birth: [birth year] Referring Provider (PT): Dr. Donnie Mesa   Encounter Date: 08/26/2019  PT End of Session - 08/26/19 1116    Visit Number  4    Number of Visits  10    Date for PT Re-Evaluation  09/13/19    PT Start Time  1114    PT Stop Time  1200    PT Time Calculation (min)  46 min    Activity Tolerance  Patient tolerated treatment well    Behavior During Therapy  [facility] for tasks assessed/performed       Past Medical History:  Diagnosis Date  . Cancer ([place])    Left breast Ca  . GERD (gastroesophageal reflux disease)   . Scoliosis   . Sickle cell trait [facility])     Past Surgical History:  Procedure Laterality Date  . BREAST RECONSTRUCTION WITH PLACEMENT OF TISSUE EXPANDER AND ALLODERM Left 07/13/2019   Procedure: LEFT BREAST RECONSTRUCTION WITH PLACEMENT OF TISSUE EXPANDER AND ALLODERM;  Surgeon: Irene Limbo, MD;  Location: [place];  Service: Plastics;  Laterality: Left;  . DEBRIDEMENT AND CLOSURE WOUND Left 07/30/2019   Procedure: DEBRIDEMENT LEFT MASTECTOMY FLAP;  Surgeon: Irene Limbo, MD;  Location: [place];  Service: Plastics;  Laterality: Left;  . INDUCED ABORTION  2017  . NIPPLE SPARING MASTECTOMY WITH SENTINEL LYMPH NODE BIOPSY Left 07/13/2019   Procedure: LEFT NIPPLE SPARING MASTECTOMY WITH SENTINEL LYMPH NODE BIOPSY;  Surgeon: Donnie Mesa, MD;  Location: [place];  Service: General;  Laterality: Left;  . TISSUE EXPANDER PLACEMENT Left 07/30/2019   Procedure: TISSUE EXPANSION LEFT CHEST;  Surgeon: Irene Limbo, MD;  Location: [place];  Service: Plastics;  Laterality: Left;    There were no vitals filed for this visit.  Subjective Assessment - 08/26/19 1118    Subjective  Pt states  that her arm is feeling pretty good but still stiff. She reports that her L breast has been sore since STM last session.    Patient is accompained by:  Family member    Pertinent History  Patient was diagnosed on 04/21/2019 with left invasive ductal carcinoma breast cancer. It is ER positive / PR negative and HER2 negative with a Ki67 of 35%. She underwent a left mastectomy and sentinel node biopsy on 07/13/2019 with a left tissue expander placed. She had 1 negative axillary lymph node removed. She had to have a tissue debridement done to the tissue flap on 07/30/2019. She has not been told her Oncotype results yet.    Patient Stated Goals  Get my arm moving better    Currently in Pain?  Yes    Pain Score  4     Pain Location  Breast    Pain Orientation  Left;Lateral    Pain Descriptors / Indicators  Tender    Pain Type  Surgical pain    Pain Onset  More than a month ago    Pain Frequency  Constant    Aggravating Factors   nothing    Pain Relieving Factors  nothing         [facility] PT Assessment - 08/26/19 0001      AROM   Left Shoulder Flexion  152 Degrees    Left Shoulder ABduction  152 Degrees    Left Shoulder  External Rotation  70 Degrees                   [facility] Adult PT Treatment/Exercise - 08/26/19 0001      Shoulder Exercises: Supine   External Rotation  AROM;Left;10 reps    External Rotation Limitations  5 second hold with VC for scapular squeeze at end range.     Flexion  AAROM;Both;10 reps    Flexion Limitations  VC for just light stretch.     Other Supine Exercises  Supine snow angels on mat table Demonstration for correct movement and VC for light stretching and avoiding pain.       Shoulder Exercises: Sidelying   Other Sidelying Exercises  Open book (horizontal abd of the L arm in R side-lying) 18x with VC for scapular retraction.       Manual Therapy   Manual Therapy  Myofascial release;Manual Lymphatic Drainage (MLD);Passive ROM    Myofascial Release   myofascial releae along the L lateral trunk wall in supine and in side-lying. especially near the inferior aspect of the L breast from bra line to pelvis and from bra line to axilla then small incremental longitudinal release over painful area.     Manual Lymphatic Drainage (MLD)  Modified in supine: L axillary and inguinal nodes, L axillo-inguinal anastomosis before and after myofascial release moving to side-lying for myofascial release and ending with MLD alont the anastomosis.     Passive ROM  P/ROM into D2 flexion pattern, flexion, external rotation and abduction with light end range stretching into greater ROM than last session                  PT Long Term Goals - 08/16/19 1602      PT LONG TERM GOAL #1   Title  Patient will demonstrate she has regained full shoulder ROM and function post operatively compared to baselines.    Time  4    Period  Weeks    Status  On-going    Target Date  09/13/19      PT LONG TERM GOAL #2   Title  Patient will increase left shoulder to >/= 140 degrees flexion for increased ease reaching.    Baseline  150 pre-op; 110 post op    Time  4    Period  Weeks    Status  New    Target Date  09/13/19      PT LONG TERM GOAL #3   Title  Patient will increase left shoulder to >/= 160 degrees abduction for increased ease reaching.    Baseline  132 post op; 175 pre-op    Time  4    Period  Weeks    Status  New    Target Date  09/13/19      PT LONG TERM GOAL #4   Title  Patient will improve DASH score to be </= 15 for increased overall left arm function.    Baseline  31.82 post op; 0 pre-op    Time  4    Period  Weeks    Target Date  09/13/19      PT LONG TERM GOAL #5   Title  Patient will be able to verbalize good understanding of lymphedema risk reduction practices.    Time  4    Period  Weeks    Status  New    Target Date  09/13/19            Plan -  08/26/19 1115    Clinical Impression Statement  Pt presents a little late for  her appointment today due to she drives from [place]. Pt has some tightness in her fascia along her L lateral trunk wall where she is reporting some tenderness; decreased following MLD and longitudinal myofascial release. Pt is demonstrating increased ROM in her L shoulder by 42 degrees flexion and 20 degrees abduction from her initial evaluation. She is demonstrating some increased movemen at her L breast; but this therapist is unsure of how the expander looked and encouraged pt to contact her reconstructive surgeon if she is concerned. Pt was able to perform AROM and AA/ROM exercises of the L shoulder this session w/light stretching within functional ROM this session. Pt would like to take one week off of therapy due to she is starting chemotherapy next week. Pt will benefit from continued POC at this time depending on side effects related to chemotherapy.    Rehab Potential  Excellent    PT Frequency  2x / week    PT Duration  4 weeks    PT Treatment/Interventions  ADLs/Self Care Home Management;Therapeutic exercise;Patient/family education;Manual techniques;Manual lymph drainage;Therapeutic activities;Passive range of motion;Scar mobilization    PT Next Visit Plan  Continue with STM, myofascial, AA/ROM, Pulleys, PROM, gentle ROM exercises as tolerated    PT Home Exercise Plan  Post op shoulder ROM HEP, supine cane flexion and abduction    Consulted and Agree with Plan of Care  Patient       Patient will benefit from skilled therapeutic intervention in order to improve the following deficits and impairments:  Postural dysfunction, Decreased range of motion, Decreased knowledge of precautions, Impaired UE functional use, Pain, Increased fascial restricitons, Decreased strength, Increased edema, Decreased scar mobility  Visit Diagnosis: Malignant neoplasm of upper-outer quadrant of left breast in female, estrogen receptor positive ([name])  Abnormal posture  Aftercare following surgery for  neoplasm  Stiffness of left shoulder, not elsewhere classified     Problem List Patient Active Problem List   Diagnosis Date Noted  . Invasive ductal carcinoma of breast, female, left ([place]) 07/13/2019  . Genetic testing 05/28/2019  . Malignant neoplasm of upper-outer quadrant of left breast in female, estrogen receptor positive ([place]) 04/30/2019   PHYSICAL THERAPY DISCHARGE SUMMARY   Plan:                                                    Patient goals were not met. Patient is being discharged due to not returning since the last visit.  ?????      Ander Purpura, PT 08/26/2019, 12:01 PM  [place], [place], [address] Phone: [phone]   Fax:  [phone]  Name: Adanna Zuckerman MRN: [id number] Date of Birth: [birth year]

## 2019-08-26 NOTE — Progress Notes (Signed)
[place] Work Follow Up  Holiday representative met with patient at support services to complete [employer] application. All materials gathered and put in the mail today.   Patient also reports that she was approved for and received $300 from the [facility].    Hannalee Castor E, LCSW

## 2019-08-27 ENCOUNTER — Telehealth: Payer: Self-pay

## 2019-08-27 NOTE — Telephone Encounter (Signed)
-----   Message from Truitt Merle, MD sent at 08/27/2019  7:44 AM EST ----- Please let pt know that her CT, bone scan and ECHO were all good, no concerns. We will see her back next week for chemo. Make sure she has chemo class. Thanks   Truitt Merle  08/27/2019

## 2019-08-27 NOTE — Telephone Encounter (Signed)
I spoke with Victoria Barker and let her know her CT, bones scan, and ECHO were all good with no concerns.  She has completed the chemotherapy education.

## 2019-08-28 ENCOUNTER — Other Ambulatory Visit (HOSPITAL_COMMUNITY)
Admission: RE | Admit: 2019-08-28 | Discharge: 2019-08-28 | Disposition: A | Discharge status: Home / Self Care | Payer: BC Managed Care – PPO | Source: Ambulatory Visit | Attending: Surgery | Admitting: Surgery

## 2019-08-28 DIAGNOSIS — Z20822 Contact with and (suspected) exposure to covid-19: Secondary | ICD-10-CM | POA: Insufficient documentation

## 2019-08-28 DIAGNOSIS — Z01812 Encounter for preprocedural laboratory examination: Secondary | ICD-10-CM | POA: Insufficient documentation

## 2019-08-28 LAB — SARS CORONAVIRUS 2 (TAT 6-24 HRS): SARS Coronavirus 2: NEGATIVE

## 2019-09-01 ENCOUNTER — Encounter (HOSPITAL_BASED_OUTPATIENT_CLINIC_OR_DEPARTMENT_OTHER)
Admission: RE | Disposition: A | Discharge status: Home / Self Care | Payer: Self-pay | Source: Home / Self Care | Attending: Surgery

## 2019-09-01 ENCOUNTER — Ambulatory Visit (HOSPITAL_BASED_OUTPATIENT_CLINIC_OR_DEPARTMENT_OTHER): Payer: BC Managed Care – PPO | Admitting: Anesthesiology

## 2019-09-01 ENCOUNTER — Other Ambulatory Visit: Payer: Self-pay

## 2019-09-01 ENCOUNTER — Ambulatory Visit (HOSPITAL_BASED_OUTPATIENT_CLINIC_OR_DEPARTMENT_OTHER)
Admission: RE | Admit: 2019-09-01 | Discharge: 2019-09-01 | Disposition: A | Discharge status: Home / Self Care | Payer: BC Managed Care – PPO | Attending: Surgery | Admitting: Surgery

## 2019-09-01 ENCOUNTER — Ambulatory Visit (HOSPITAL_COMMUNITY): Payer: BC Managed Care – PPO

## 2019-09-01 ENCOUNTER — Encounter (HOSPITAL_BASED_OUTPATIENT_CLINIC_OR_DEPARTMENT_OTHER): Payer: Self-pay | Admitting: Surgery

## 2019-09-01 DIAGNOSIS — Z87891 Personal history of nicotine dependence: Secondary | ICD-10-CM | POA: Diagnosis not present

## 2019-09-01 DIAGNOSIS — C50412 Malignant neoplasm of upper-outer quadrant of left female breast: Secondary | ICD-10-CM | POA: Diagnosis not present

## 2019-09-01 DIAGNOSIS — C50919 Malignant neoplasm of unspecified site of unspecified female breast: Secondary | ICD-10-CM

## 2019-09-01 DIAGNOSIS — Z452 Encounter for adjustment and management of vascular access device: Secondary | ICD-10-CM

## 2019-09-01 DIAGNOSIS — Z79899 Other long term (current) drug therapy: Secondary | ICD-10-CM | POA: Diagnosis not present

## 2019-09-01 DIAGNOSIS — C50912 Malignant neoplasm of unspecified site of left female breast: Secondary | ICD-10-CM | POA: Insufficient documentation

## 2019-09-01 DIAGNOSIS — K219 Gastro-esophageal reflux disease without esophagitis: Secondary | ICD-10-CM | POA: Insufficient documentation

## 2019-09-01 DIAGNOSIS — Z9012 Acquired absence of left breast and nipple: Secondary | ICD-10-CM | POA: Diagnosis not present

## 2019-09-01 DIAGNOSIS — D573 Sickle-cell trait: Secondary | ICD-10-CM | POA: Diagnosis not present

## 2019-09-01 HISTORY — PX: PORTACATH PLACEMENT: SHX2246

## 2019-09-01 LAB — POCT PREGNANCY, URINE: Preg Test, Ur: NEGATIVE

## 2019-09-01 SURGERY — INSERTION, TUNNELED CENTRAL VENOUS DEVICE, WITH PORT
Anesthesia: General | Site: Chest | Laterality: Right

## 2019-09-01 MED ORDER — FENTANYL CITRATE (PF) 100 MCG/2ML IJ SOLN
INTRAMUSCULAR | Status: DC | PRN
  Administered 2019-09-01: 100 ug via INTRAVENOUS

## 2019-09-01 MED ORDER — FENTANYL CITRATE (PF) 100 MCG/2ML IJ SOLN
INTRAMUSCULAR | Status: AC
  Filled 2019-09-01: qty 2

## 2019-09-01 MED ORDER — OXYCODONE HCL 5 MG PO TABS
ORAL_TABLET | ORAL | Status: AC
  Filled 2019-09-01: qty 1

## 2019-09-01 MED ORDER — CEFAZOLIN SODIUM-DEXTROSE 2-4 GM/100ML-% IV SOLN
INTRAVENOUS | Status: AC
  Filled 2019-09-01: qty 100

## 2019-09-01 MED ORDER — PROPOFOL 10 MG/ML IV BOLUS
INTRAVENOUS | Status: DC | PRN
  Administered 2019-09-01: 200 mg via INTRAVENOUS

## 2019-09-01 MED ORDER — HEPARIN SOD (PORK) LOCK FLUSH 100 UNIT/ML IV SOLN
INTRAVENOUS | Status: DC | PRN
  Administered 2019-09-01: 500 [IU]

## 2019-09-01 MED ORDER — ONDANSETRON HCL 4 MG/2ML IJ SOLN
INTRAMUSCULAR | Status: DC | PRN
  Administered 2019-09-01: 4 mg via INTRAVENOUS

## 2019-09-01 MED ORDER — FENTANYL CITRATE (PF) 100 MCG/2ML IJ SOLN: 50.0000 ug | INTRAMUSCULAR | Status: DC | PRN

## 2019-09-01 MED ORDER — HEPARIN SOD (PORK) LOCK FLUSH 100 UNIT/ML IV SOLN
INTRAVENOUS | Status: AC
  Filled 2019-09-01: qty 5

## 2019-09-01 MED ORDER — MIDAZOLAM HCL 2 MG/2ML IJ SOLN
INTRAMUSCULAR | Status: AC
  Filled 2019-09-01: qty 2

## 2019-09-01 MED ORDER — DEXAMETHASONE SODIUM PHOSPHATE 4 MG/ML IJ SOLN
INTRAMUSCULAR | Status: DC | PRN
  Administered 2019-09-01: 5 mg via INTRAVENOUS

## 2019-09-01 MED ORDER — HEPARIN (PORCINE) IN NACL 2-0.9 UNITS/ML
INTRAMUSCULAR | Status: AC | PRN
  Administered 2019-09-01: 1 via INTRAVENOUS

## 2019-09-01 MED ORDER — BUPIVACAINE HCL (PF) 0.25 % IJ SOLN
INTRAMUSCULAR | Status: DC | PRN
  Administered 2019-09-01: 10 mL

## 2019-09-01 MED ORDER — MIDAZOLAM HCL 2 MG/2ML IJ SOLN: 1.0000 mg | INTRAMUSCULAR | Status: DC | PRN

## 2019-09-01 MED ORDER — LIDOCAINE HCL (CARDIAC) PF 100 MG/5ML IV SOSY
PREFILLED_SYRINGE | INTRAVENOUS | Status: DC | PRN
  Administered 2019-09-01: 60 mg via INTRAVENOUS

## 2019-09-01 MED ORDER — HEPARIN (PORCINE) IN NACL 1000-0.9 UT/500ML-% IV SOLN
INTRAVENOUS | Status: AC
  Filled 2019-09-01: qty 500

## 2019-09-01 MED ORDER — BUPIVACAINE HCL (PF) 0.25 % IJ SOLN
INTRAMUSCULAR | Status: AC
  Filled 2019-09-01: qty 30

## 2019-09-01 MED ORDER — LACTATED RINGERS IV SOLN: INTRAVENOUS | Status: DC

## 2019-09-01 MED ORDER — OXYCODONE HCL 5 MG/5ML PO SOLN: 5.0000 mg | Freq: Once | ORAL | Status: AC | PRN

## 2019-09-01 MED ORDER — ONDANSETRON HCL 4 MG/2ML IJ SOLN: 4.0000 mg | Freq: Once | INTRAMUSCULAR | Status: DC | PRN

## 2019-09-01 MED ORDER — FENTANYL CITRATE (PF) 100 MCG/2ML IJ SOLN: 25.0000 ug | INTRAMUSCULAR | Status: DC | PRN

## 2019-09-01 MED ORDER — CHLORHEXIDINE GLUCONATE CLOTH 2 % EX PADS: 6.0000 | MEDICATED_PAD | Freq: Once | CUTANEOUS | Status: DC

## 2019-09-01 MED ORDER — OXYCODONE HCL 5 MG PO TABS
5.0000 mg | ORAL_TABLET | Freq: Once | ORAL | Status: AC | PRN
  Administered 2019-09-01: 14:00:00 5 mg via ORAL

## 2019-09-01 MED ORDER — CEFAZOLIN SODIUM-DEXTROSE 2-4 GM/100ML-% IV SOLN
2.0000 g | INTRAVENOUS | Status: AC
  Administered 2019-09-01: 2 g via INTRAVENOUS

## 2019-09-01 MED ORDER — MIDAZOLAM HCL 5 MG/5ML IJ SOLN
INTRAMUSCULAR | Status: DC | PRN
  Administered 2019-09-01: 2 mg via INTRAVENOUS

## 2019-09-01 SURGICAL SUPPLY — 50 items
BAG DECANTER FOR FLEXI CONT (MISCELLANEOUS) ×2 IMPLANT
BENZOIN TINCTURE PRP APPL 2/3 (GAUZE/BANDAGES/DRESSINGS) ×2 IMPLANT
BLADE SURG 11 STRL SS (BLADE) ×2 IMPLANT
BLADE SURG 15 STRL LF DISP TIS (BLADE) ×1 IMPLANT
BLADE SURG 15 STRL SS (BLADE) ×1
CANISTER SUCT 1200ML W/VALVE (MISCELLANEOUS) IMPLANT
CHLORAPREP W/TINT 26 (MISCELLANEOUS) ×2 IMPLANT
CLEANER CAUTERY TIP 5X5 PAD (MISCELLANEOUS) ×1 IMPLANT
COVER BACK TABLE 60X90IN (DRAPES) ×2 IMPLANT
COVER MAYO STAND STRL (DRAPES) ×2 IMPLANT
COVER PROBE 5X48 (MISCELLANEOUS) ×1
COVER WAND RF STERILE (DRAPES) IMPLANT
DECANTER SPIKE VIAL GLASS SM (MISCELLANEOUS) ×2 IMPLANT
DRAPE C-ARM 42X72 X-RAY (DRAPES) ×2 IMPLANT
DRAPE LAPAROTOMY TRNSV 102X78 (DRAPES) ×2 IMPLANT
DRAPE UTILITY XL STRL (DRAPES) ×2 IMPLANT
DRSG TEGADERM 2-3/8X2-3/4 SM (GAUZE/BANDAGES/DRESSINGS) ×1 IMPLANT
DRSG TEGADERM 4X4.75 (GAUZE/BANDAGES/DRESSINGS) ×2 IMPLANT
ELECT REM PT RETURN 9FT ADLT (ELECTROSURGICAL) ×2
ELECTRODE REM PT RTRN 9FT ADLT (ELECTROSURGICAL) ×1 IMPLANT
GAUZE SPONGE 4X4 12PLY STRL LF (GAUZE/BANDAGES/DRESSINGS) ×1 IMPLANT
GLOVE BIO SURGEON STRL SZ7 (GLOVE) ×2 IMPLANT
GLOVE BIOGEL PI IND STRL 7.5 (GLOVE) ×1 IMPLANT
GLOVE BIOGEL PI INDICATOR 7.5 (GLOVE) ×1
GOWN STRL REUS W/ TWL LRG LVL3 (GOWN DISPOSABLE) ×1 IMPLANT
GOWN STRL REUS W/TWL LRG LVL3 (GOWN DISPOSABLE) ×1
IV KIT MINILOC 20X1 SAFETY (NEEDLE) IMPLANT
KIT CVR 48X5XPRB PLUP LF (MISCELLANEOUS) IMPLANT
KIT PORT POWER 8FR ISP CVUE (Port) ×1 IMPLANT
NDL HYPO 25X1 1.5 SAFETY (NEEDLE) ×1 IMPLANT
NDL SAFETY ECLIPSE 18X1.5 (NEEDLE) IMPLANT
NDL SPNL 22GX3.5 QUINCKE BK (NEEDLE) IMPLANT
NEEDLE HYPO 18GX1.5 SHARP (NEEDLE)
NEEDLE HYPO 25X1 1.5 SAFETY (NEEDLE) ×2 IMPLANT
NEEDLE SPNL 22GX3.5 QUINCKE BK (NEEDLE) IMPLANT
PACK BASIN DAY SURGERY FS (CUSTOM PROCEDURE TRAY) ×2 IMPLANT
PAD CLEANER CAUTERY TIP 5X5 (MISCELLANEOUS) ×1
PENCIL SMOKE EVACUATOR (MISCELLANEOUS) ×2 IMPLANT
SLEEVE SCD COMPRESS KNEE MED (MISCELLANEOUS) ×2 IMPLANT
SPONGE GAUZE 2X2 8PLY STRL LF (GAUZE/BANDAGES/DRESSINGS) ×2 IMPLANT
STRIP CLOSURE SKIN 1/2X4 (GAUZE/BANDAGES/DRESSINGS) ×2 IMPLANT
SUT MON AB 4-0 PC3 18 (SUTURE) ×2 IMPLANT
SUT PROLENE 2 0 CT2 30 (SUTURE) ×2 IMPLANT
SUT VIC AB 3-0 SH 27 (SUTURE) ×1
SUT VIC AB 3-0 SH 27X BRD (SUTURE) ×1 IMPLANT
SYR 5ML LUER SLIP (SYRINGE) ×2 IMPLANT
SYR CONTROL 10ML LL (SYRINGE) ×2 IMPLANT
TOWEL GREEN STERILE FF (TOWEL DISPOSABLE) ×2 IMPLANT
TUBE CONNECTING 20X1/4 (TUBING) IMPLANT
YANKAUER SUCT BULB TIP NO VENT (SUCTIONS) IMPLANT

## 2019-09-01 NOTE — H&P (Signed)
Victoria Barker is an 39 y.o. female.   Chief Complaint: Left breast cancer HPI: This is a 39 year old female who presented with invasive ductal carcinoma with surrounding DCIS.  This is located in the upper outer quadrant of her left breast.  On 07/13/2019 she underwent left mastectomy (nipple sparing) with sentinel lymph node biopsy.  She underwent immediate reconstruction.  She has recovered from that surgery.  Oncotype testing was performed and chemotherapy is recommended.  She presents now for port placement.  Past Medical History:  Diagnosis Date  . Cancer ([place])    Left breast Ca  . GERD (gastroesophageal reflux disease)   . Scoliosis   . Sickle cell trait [facility])     Past Surgical History:  Procedure Laterality Date  . BREAST RECONSTRUCTION WITH PLACEMENT OF TISSUE EXPANDER AND ALLODERM Left 07/13/2019   Procedure: LEFT BREAST RECONSTRUCTION WITH PLACEMENT OF TISSUE EXPANDER AND ALLODERM;  Surgeon: Irene Limbo, MD;  Location: [place];  Service: Plastics;  Laterality: Left;  . DEBRIDEMENT AND CLOSURE WOUND Left 07/30/2019   Procedure: DEBRIDEMENT LEFT MASTECTOMY FLAP;  Surgeon: Irene Limbo, MD;  Location: [place];  Service: Plastics;  Laterality: Left;  . INDUCED ABORTION  2017  . NIPPLE SPARING MASTECTOMY WITH SENTINEL LYMPH NODE BIOPSY Left 07/13/2019   Procedure: LEFT NIPPLE SPARING MASTECTOMY WITH SENTINEL LYMPH NODE BIOPSY;  Surgeon: Donnie Mesa, MD;  Location: [place];  Service: General;  Laterality: Left;  . TISSUE EXPANDER PLACEMENT Left 07/30/2019   Procedure: TISSUE EXPANSION LEFT CHEST;  Surgeon: Irene Limbo, MD;  Location: [place];  Service: Plastics;  Laterality: Left;    Family History  Problem Relation Age of Onset  . Hypertension Mother   . Hypertension Maternal Grandmother    Social History:  reports that she has quit smoking. She has a 1.25 pack-year smoking history. She has never used smokeless tobacco. She reports  previous alcohol use. She reports current drug use. Drug: Marijuana.  Allergies:  Allergies  Allergen Reactions  . Bactrim [Sulfamethoxazole-Trimethoprim] Nausea And Vomiting    Medications Prior to Admission  Medication Sig Dispense Refill  . dexlansoprazole (DEXILANT) 60 MG capsule Take 60 mg by mouth daily.    Marland Kitchen HYDROcodone-acetaminophen (NORCO/VICODIN) 5-325 MG tablet Take 1 tablet by mouth every 6 (six) hours as needed for moderate pain.    . Lactobacillus-Inulin ([place] PO) Take 1 capsule by mouth daily as needed (digestive health.).     Marland Kitchen Multiple Vitamin (MULTIVITAMIN WITH MINERALS) TABS tablet Take 1 tablet by mouth 3 (three) times a week. WOMEN'S ONE-A-DAY    . lidocaine-prilocaine (EMLA) cream Apply 1 application topically as needed. 30 g 0  . ondansetron (ZOFRAN) 8 MG tablet Take 1 tablet (8 mg total) by mouth 2 (two) times daily as needed for refractory nausea / vomiting. Start on day 3 after chemo. 30 tablet 1  . prochlorperazine (COMPAZINE) 10 MG tablet Take 1 tablet (10 mg total) by mouth every 6 (six) hours as needed (Nausea or vomiting). 30 tablet 1    Results for orders placed or performed during the hospital encounter of 09/01/19 (from the past 48 hour(s))  Pregnancy, urine POC     Status: None   Collection Time: 09/01/19 10:27 AM  Result Value Ref Range   Preg Test, Ur NEGATIVE NEGATIVE    Comment:        THE SENSITIVITY OF THIS METHODOLOGY IS >24 mIU/mL     Blood pressure 121/74, pulse 84, temperature (!) 97.5  F (36.4 C), temperature source Tympanic, resp. rate 20, height 5\' 2"  (1.575 m), weight 54.3 kg, last menstrual period 08/14/2019, SpO2 100 %. Physical Exam  GENERAL:alert, no distress and comfortable SKIN: skin color, texture, turgor are normal, no rashes or significant lesions EYES: normal, Conjunctiva are pink and non-injected, sclera clear  NECK: supple, thyroid normal size, non-tender, without nodularity LYMPH:  no palpable  lymphadenopathy in the cervical, axillary  LUNGS: clear to auscultation and percussion with normal breathing effort HEART: regular rate & rhythm and no murmurs and no lower extremity edema ABDOMEN:abdomen soft, non-tender and normal bowel sounds Musculoskeletal:no cyanosis of digits and no clubbing  NEURO: alert & oriented x 3 with fluent speech, no focal motor/sensory deficits BREAST: s/p left mastectomy and reconstruction with tissue expander: surgical incision healed well.  No palpable mass, nodules or adenopathy bilaterally. Assessment/Plan Ultrasound-guided port placement.  The surgical procedure has been discussed with the patient.  Potential risks, benefits, alternative treatments, and expected outcomes have been explained.  All of the patient's questions at this time have been answered.  The likelihood of reaching the patient's treatment goal is good.  The patient understand the proposed surgical procedure and wishes to proceed.   Maia Petties, MD 09/01/2019, 11:06 AM

## 2019-09-01 NOTE — Discharge Instructions (Signed)

## 2019-09-01 NOTE — Anesthesia Preprocedure Evaluation (Signed)
Anesthesia Evaluation  Patient identified by MRN, date of birth, ID band Patient awake    Reviewed: Allergy & Precautions, NPO status , Patient's Chart, lab work & pertinent test results  Airway Mallampati: I  TM Distance: >3 FB Neck ROM: Full    Dental no notable dental hx. (+) Teeth Intact   Pulmonary former smoker,    Pulmonary exam normal breath sounds clear to auscultation       Cardiovascular negative cardio ROS Normal cardiovascular exam Rhythm:Regular Rate:Normal     Neuro/Psych negative neurological ROS  negative psych ROS   GI/Hepatic Neg liver ROS, GERD  Medicated and Controlled,  Endo/Other  Left Breast Ca  Renal/GU negative Renal ROS  negative genitourinary   Musculoskeletal Scoliosis   Abdominal   Peds  Hematology negative hematology ROS (+)   Anesthesia Other Findings   Reproductive/Obstetrics                             Anesthesia Physical Anesthesia Plan  ASA: II  Anesthesia Plan: General   Post-op Pain Management:    Induction: Intravenous  PONV Risk Score and Plan: 4 or greater and Midazolam, Ondansetron, Dexamethasone and Treatment may vary due to age or medical condition  Airway Management Planned: LMA  Additional Equipment:   Intra-op Plan:   Post-operative Plan: Extubation in OR  Informed Consent: I have reviewed the patients History and Physical, chart, labs and discussed the procedure including the risks, benefits and alternatives for the proposed anesthesia with the patient or authorized representative who has indicated his/her understanding and acceptance.     Dental advisory given  Plan Discussed with: CRNA, Surgeon and Anesthesiologist  Anesthesia Plan Comments:         Anesthesia Quick Evaluation

## 2019-09-01 NOTE — Transfer of Care (Signed)
Immediate Anesthesia Transfer of Care Note  Patient: Victoria Barker  Procedure(s) Performed: INSERTION PORT-A-CATH WITH ULTRASOUND GUIDANCE (Right Chest)  Patient Location: PACU  Anesthesia Type:General  Level of Consciousness: awake, alert , oriented and patient cooperative  Airway & Oxygen Therapy: Patient Spontanous Breathing and Patient connected to face mask oxygen  Post-op Assessment: Report given to RN and Post -op Vital signs reviewed and stable  Post vital signs: Reviewed and stable  Last Vitals:  Vitals Value Taken Time  BP    Temp    Pulse 101 09/01/19 1302  Resp 30 09/01/19 1302  SpO2 100 % 09/01/19 1302  Vitals shown include unvalidated device data.  Last Pain:  Vitals:   09/01/19 1034  TempSrc: Tympanic  PainSc: 0-No pain      Patients Stated Pain Goal: 5 ([id number])  Complications: No apparent anesthesia complications

## 2019-09-01 NOTE — Op Note (Signed)
Pre-Op diagnosis -left breast cancer Postop diagnosis: Same Procedure performed: Ultrasound-guided right internal jugular vein port placement Surgeon:Kalimah Capurro K Hitomi Slape Anesthesia: General via LMA Indications:This is a 39 year old female who presented with invasive ductal carcinoma with surrounding DCIS.  This is located in the upper outer quadrant of her left breast.  On 07/13/2019 she underwent left mastectomy (nipple sparing) with sentinel lymph node biopsy.  She underwent immediate reconstruction.  She has recovered from that surgery.  Oncotype testing was performed and chemotherapy is recommended.  She presents now for port placement.  Description of procedure: The patient is brought to the operating room and placed in the supine position on the operating room table.  Her arms were tucked at her sides.  After an adequate level of general anesthesia was obtained she was positioned in slight Trendelenburg with her head tilted to the left.  Her right neck and chest were prepped with ChloraPrep and draped in sterile fashion.  A timeout was taken to ensure the proper patient and proper procedure.  We interrogated the neck with ultrasound.  I identified the jugular vein.  This was cannulated with an 18-gauge needle.  There is good nonpulsatile blood return.  The wire was passed through the needle.  Fluoroscopy showed that this went down the right side of the mediastinum to the right atrium.  The needle was removed.  We created a subcutaneous pocket below the right clavicle.  We first infiltrated with local anesthetic.  I created a subcutaneous tunnel between the pocket and the insertion site on the neck.  We assembled an 8 [place] port and tunneled this from the pocket up to the neck.  The catheter was cut to the appropriate length using fluoroscopy for guidance.  We passed the dilator and breakaway sheath over the wire under direct fluoroscopic guidance.  The dilator and wire were removed.  The catheter  was advanced through the breakaway sheath.  The sheath was removed.  Fluoroscopy showed that there were no kinks along the length of the catheter.  We were able to easily aspirate blood and were able to flush easily.  We secured the port with interrupted 2-0 Prolene sutures.  The main incision was closed with 3-0 Vicryl and 4-0 Monocryl.  The insertion site at the neck was closed with 4-0 Monocryl.  Concentrated heparin solution was then instilled into the port.  We left the port access to begin chemotherapy tomorrow.  Benzoin and Steri-Strips were applied.  The patient was then extubated and brought to the recovery room in stable condition.  All sponge, instrument, and needle counts are correct.  Portable chest x-ray is pending.  Victoria Barker. Victoria Dover, MD, [facility] Surgery  General/ Trauma Surgery   09/01/2019 12:58 PM

## 2019-09-01 NOTE — Anesthesia Procedure Notes (Signed)
Procedure Name: LMA Insertion Date/Time: 09/01/2019 12:17 PM Performed by: Signe Colt, CRNA Pre-anesthesia Checklist: Patient identified, Emergency Drugs available, Suction available and Patient being monitored Patient Re-evaluated:Patient Re-evaluated prior to induction Oxygen Delivery Method: Circle system utilized Preoxygenation: Pre-oxygenation with 100% oxygen Induction Type: IV induction Ventilation: Mask ventilation without difficulty LMA: LMA inserted LMA Size: 4.0 Number of attempts: 1 Airway Equipment and Method: Bite block Placement Confirmation: positive ETCO2 Tube secured with: Tape Dental Injury: Teeth and Oropharynx as per pre-operative assessment

## 2019-09-01 NOTE — Anesthesia Postprocedure Evaluation (Signed)
Anesthesia Post Note  Patient: Radio producer  Procedure(s) Performed: INSERTION PORT-A-CATH WITH ULTRASOUND GUIDANCE (Right Chest)     Patient location during evaluation: PACU Anesthesia Type: General Level of consciousness: awake and alert and oriented Pain management: pain level controlled Vital Signs Assessment: post-procedure vital signs reviewed and stable Respiratory status: spontaneous breathing, nonlabored ventilation and respiratory function stable Cardiovascular status: blood pressure returned to baseline and stable Postop Assessment: no apparent nausea or vomiting Anesthetic complications: no    Last Vitals:  Vitals:   09/01/19 1325 09/01/19 1340  BP:  131/85  Pulse: 95 (!) 102  Resp: (!) 24 18  Temp:  36.7 C  SpO2: 100% 100%    Last Pain:  Vitals:   09/01/19 1412  TempSrc:   PainSc: 4                  Addilee Neu A.

## 2019-09-01 NOTE — Progress Notes (Signed)
Victoria Barker   [phone] [phone]   Clinic Follow up Note   Patient Care Team: [name], MD as PCP - General (Obstetrics and Gynecology) Mauro Kaufmann, RN as Oncology Nurse Navigator Rockwell Germany, RN as Oncology Nurse Navigator Donnie Mesa, MD as Consulting Physician (General Surgery) Truitt Merle, MD as Consulting Physician (Hematology) Eppie Gibson, MD as Attending Physician (Radiation Oncology) 09/02/2019  CHIEF COMPLAINT: F/u left breast cancer   SUMMARY OF ONCOLOGIC HISTORY: Oncology History  Malignant neoplasm of upper-outer quadrant of left breast in female, estrogen receptor positive ([place])  04/21/2019 Mammogram   Diagnostic mammogram 04/21/19  IMPRESSION 1. findings highly suspicious for left breast cancer. There is a palpable irregular mass 4cm from nipple measuring 2.4x2.4x1.8cm in the 1:00 position axis of the left breast and pleomorphic  calcifications in the upper-outer quadrant as described above.  2. single left 0.9x0.5cm axillary lymph node with a mild/borderline thickened cortex.    04/28/2019 Cancer Staging   Staging form: Breast, AJCC 8th Edition - Clinical stage from 04/28/2019: Stage IIA (cT2, cN0, cM0, G2, ER+, PR-, HER2: Equivocal) - Signed by Truitt Merle, MD on 05/04/2019   04/28/2019 Initial Biopsy   Diagnosis 04/28/19 1. Breast, left, needle core biopsy, 1 o'clock position - INVASIVE DUCTAL CARCINOMA. - DUCTAL CARCINOMA IN SITU. - SEE COMMENT. 2. Lymph node, needle/core biopsy, left axilla - THERE IS NO EVIDENCE OF CARCINOMA IN 1 OF 1 LYMPH NODE (0/1). 3. Breast, left, needle core biopsy, upper, outer quadrant - DUCTAL CARCINOMA IN SITU WITH CALCIFICATIONS. - SEE COMMENT.   04/28/2019 Receptors her2   1. PROGNOSTIC INDICATORS Results: HER2 negative  Estrogen Receptor: 80%, POSITIVE, STRONG STAINING INTENSITY Progesterone Receptor: 0%, NEGATIVE Proliferation Marker Ki67: 35%    04/30/2019 Initial  Diagnosis   Malignant neoplasm of upper-outer quadrant of left breast in female, estrogen receptor positive ([name])    Genetic Testing   No pathogenic variants identified. 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 The final report date is 05/28/2019.  The STAT Breast cancer panel offered by [facility] includes sequencing and rearrangement analysis for the following 9 genes:  ATM, BRCA1, BRCA2, CDH1, CHEK2, PALB2, PTEN, STK11 and TP53.    The Common Hereditary Cancers Panel offered by [facility] includes sequencing and/or deletion duplication testing of the following 48 genes: APC, ATM, AXIN2, BARD1, BMPR1A, BRCA1, BRCA2, BRIP1, CDH1, CDKN2A (p14ARF), CDKN2A (p16INK4a), CKD4, CHEK2, CTNNA1, DICER1, EPCAM (Deletion/duplication testing only), GREM1 (promoter region deletion/duplication testing only), KIT, MEN1, MLH1, MSH2, MSH3, MSH6, MUTYH, NBN, NF1, NHTL1, PALB2, PDGFRA, PMS2, POLD1, POLE, PTEN, RAD50, RAD51C, RAD51D, RNF43, SDHB, SDHC, SDHD, SMAD4, SMARCA4. STK11, TP53, TSC1, TSC2, and VHL.  The following genes were evaluated for sequence changes only: SDHA and HOXB13 c.251G>A variant only.   05/11/2019 Breast MRI   IMPRESSION: 1. Enhancing mass and architectural distortion spanning approximately 6 x 3 x 5.3 cm in the upper outer left breast consistent with the patient's biopsy-proven site of invasive cancer. 2. Biopsy changes in the anterior left breast at the site of the patient's biopsy-proven ductal carcinoma in situ. 3. Suspicious changes within the upper-outer quadrant of the left breast spanning a total of 8 cm in the AP dimension. This includes the site of biopsy-proven DCIS anteriorly and suspicious enhancement extending to the level of the pectoralis muscle posteriorly. 4. Suspicious 7 mm enhancing mass in the far posterior slightly lateral left breast (image 138/212). 5. No MRI evidence  of malignancy on the right. 6.  No suspicious lymphadenopathy.     07/13/2019 Surgery   LEFT NIPPLE SPARING MASTECTOMY WITH SENTINEL LYMPH NODE BIOPSY and LEFT BREAST RECONSTRUCTION WITH PLACEMENT OF TISSUE EXPANDER AND ALLODERM by D.r Tsuie and Dr Iran Planas.    07/13/2019 Pathology Results   SURGICAL PATHOLOGY   FINAL MICROSCOPIC DIAGNOSIS:   A. LYMPH NODE, LEFT AXILLARY #1, SENTINEL, BIOPSY:  - One of one lymph nodes negative for carcinoma (0/1).   B. BREAST, LEFT, MASTECTOMY:  - Invasive ductal carcinoma, grade 3, spanning 3.4 cm.  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  - Invasive carcinoma is 0.2 cm from the posterior margin focally (not on  ink) and 0.4 cm from the  anterior margin focally (not on ink).  - In situ carcinoma is 0.4 cm from the posterior margin focally (not on  ink).  - Biopsy site x2.  - See oncology table.   C. NIPPLE, LEFT, BIOPSY:  - Benign nipple tissue.   D. LYMPH NODE, LEFT AXILLARY #2, SENTINEL, BIOPSY:  - One of one lymph nodes negative for carcinoma (0/1).     GROUP 1:  HER2 **POSITIVE**    07/13/2019 Oncotype testing   Ocotype score 56 with 39% risk of recurrence with Tmaoxifen or AI alone. There is a 15% benefit from adjuvant chemo.     07/13/2019 Cancer Staging   Staging form: Breast, AJCC 8th Edition - Pathologic stage from 07/13/2019: Stage IIA (pT2, pN0, cM0, G3, ER+, PR-, HER2+, Oncotype DX score: 56) - Signed by Truitt Merle, MD on 08/20/2019    Chemotherapy   TCHP (Taxol, Carboplatin, Herceptin, Perjeta) q3weeks for 6 cycles starting 08/30/19, followed by anti-HER2 maintenance therapy with Herceptin q3weeks to complete 1 year of treatment.     Invasive ductal carcinoma of breast, female, left ([place])  07/13/2019 Initial Diagnosis   Invasive ductal carcinoma of breast, female, left ([place])   09/02/2019 -  Chemotherapy   The patient had palonosetron (ALOXI) injection 0.25 mg, 0.25 mg, Intravenous,  Once, 1 of 6 cycles pegfilgrastim-jmdb (FULPHILA) injection 6  mg, 6 mg, Subcutaneous,  Once, 1 of 6 cycles CARBOplatin (PARAPLATIN) 629.4 mg in sodium chloride 0.9 % 250 mL chemo infusion, 629.4 mg (100 % of original dose 629.4 mg), Intravenous,  Once, 1 of 6 cycles Dose modification:   (original dose 629.4 mg, Cycle 1) DOCEtaxel (TAXOTERE) 110 mg in sodium chloride 0.9 % 250 mL chemo infusion, 75 mg/m2 = 110 mg, Intravenous,  Once, 1 of 6 cycles fosaprepitant (EMEND) 150 mg in sodium chloride 0.9 % 145 mL IVPB, 150 mg, Intravenous,  Once, 1 of 6 cycles trastuzumab-dkst (OGIVRI) 420 mg in sodium chloride 0.9 % 250 mL chemo infusion, 8 mg/kg = 420 mg, Intravenous,  Once, 1 of 17 cycles  for chemotherapy treatment.      CURRENT THERAPY:  [place] q3weeks for 6 cycles starting 09/02/19, followed by maintenance Herceptin to complete one year therapy   INTERVAL HISTORY: Victoria Barker returns for f/u and treatment as scheduled. She had port placed by Dr. Georgette Dover 09/01/19. She has some soreness at right neck and left ribs from surgery. She is doing well otherwise. Drinking Ensure, eating fruits and healthy foods, doing exercises and PT. Denies n/v/c/d. She started Dex as prescribed, had chemo edu class, prepared for dignicap, and has anti-emetics at ready. No recent fever, chills, cough, chest pain, dyspnea, or other concerns.    MEDICAL HISTORY:  Past Medical History:  Diagnosis Date  . Cancer (  [place])    Left breast Ca  . GERD (gastroesophageal reflux disease)   . Scoliosis   . Sickle cell trait ([place])     SURGICAL HISTORY: Past Surgical History:  Procedure Laterality Date  . BREAST RECONSTRUCTION WITH PLACEMENT OF TISSUE EXPANDER AND ALLODERM Left 07/13/2019   Procedure: LEFT BREAST RECONSTRUCTION WITH PLACEMENT OF TISSUE EXPANDER AND ALLODERM;  Surgeon: Irene Limbo, MD;  Location: [facility];  Service: Plastics;  Laterality: Left;  . DEBRIDEMENT AND CLOSURE WOUND Left 07/30/2019   Procedure: DEBRIDEMENT LEFT MASTECTOMY FLAP;  Surgeon: Irene Limbo, MD;   Location: [facility];  Service: Plastics;  Laterality: Left;  . INDUCED ABORTION  2017  . NIPPLE SPARING MASTECTOMY WITH SENTINEL LYMPH NODE BIOPSY Left 07/13/2019   Procedure: LEFT NIPPLE SPARING MASTECTOMY WITH SENTINEL LYMPH NODE BIOPSY;  Surgeon: Donnie Mesa, MD;  Location: [facility];  Service: General;  Laterality: Left;  . PORTACATH PLACEMENT Right 09/01/2019   Procedure: INSERTION PORT-A-CATH WITH ULTRASOUND GUIDANCE;  Surgeon: Donnie Mesa, MD;  Location: [facility];  Service: General;  Laterality: Right;  . TISSUE EXPANDER PLACEMENT Left 07/30/2019   Procedure: TISSUE EXPANSION LEFT CHEST;  Surgeon: Irene Limbo, MD;  Location: [facility];  Service: Plastics;  Laterality: Left;    I have reviewed the social history and family history with the patient and they are unchanged from previous note.  ALLERGIES:  is allergic to bactrim [sulfamethoxazole-trimethoprim].  MEDICATIONS:  Current Outpatient Medications  Medication Sig Dispense Refill  . dexlansoprazole (DEXILANT) 60 MG capsule Take 60 mg by mouth daily.    Marland Kitchen HYDROcodone-acetaminophen (NORCO/VICODIN) 5-325 MG tablet Take 1 tablet by mouth every 6 (six) hours as needed for moderate pain.    . Lactobacillus-Inulin (Noank PO) Take 1 capsule by mouth daily as needed (digestive health.).     Marland Kitchen lidocaine-prilocaine (EMLA) cream Apply 1 application topically as needed. 30 g 0  . Multiple Vitamin (MULTIVITAMIN WITH MINERALS) TABS tablet Take 1 tablet by mouth 3 (three) times a week. WOMEN'S ONE-A-DAY    . ondansetron (ZOFRAN) 8 MG tablet Take 1 tablet (8 mg total) by mouth 2 (two) times daily as needed for refractory nausea / vomiting. Start on day 3 after chemo. 30 tablet 1  . prochlorperazine (COMPAZINE) 10 MG tablet Take 1 tablet (10 mg total) by mouth every 6 (six) hours as needed (Nausea or vomiting). 30 tablet 1  . metroNIDAZOLE (METROGEL) 0.75 % vaginal gel Place  0.25 application vaginally daily.     No current facility-administered medications for this visit.   Facility-Administered Medications Ordered in Other Visits  Medication Dose Route Frequency Provider Last Rate Last Admin  . heparin lock flush 100 unit/mL  500 Units Intracatheter Once PRN Truitt Merle, MD      . prochlorperazine (COMPAZINE) tablet 10 mg  10 mg Oral Q6H PRN Alla Feeling, NP   10 mg at 09/02/19 1137  . sodium chloride flush (NS) 0.9 % injection 10 mL  10 mL Intracatheter PRN Truitt Merle, MD        PHYSICAL EXAMINATION: ECOG PERFORMANCE STATUS: 0 - Asymptomatic  Vitals:   09/02/19 0854  BP: 137/80  Pulse: (!) 107  Resp: 20  Temp: 98.5 F (36.9 C)  SpO2: 100%   Filed Weights   09/02/19 0854  Weight: 121 lb 8 oz (55.1 kg)    GENERAL:alert, no distress and comfortable SKIN: no rash  EYES: sclera clear NECK: without mass LUNGS: clear with normal  breathing effort HEART: regular rate & rhythm, no lower extremity edema NEURO: alert & oriented x 3 with fluent speech Breast: S/p left mastectomy with reconstruction. Incision healed well.  PAC covered with gauze dressing  LABORATORY DATA:  I have reviewed the data as listed CBC Latest Ref Rng & Units 09/02/2019 07/14/2019 07/13/2019  WBC 4.0 - 10.5 K/uL 18.7(H) 14.6(H) 16.4(H)  Hemoglobin 12.0 - 15.0 g/dL 12.7 10.8(L) 12.7  Hematocrit 36.0 - 46.0 % 37.4 32.5(L) 36.3  Platelets 150 - 400 K/uL 244 179 205     CMP Latest Ref Rng & Units 09/02/2019 07/14/2019 07/13/2019  Glucose 70 - 99 mg/dL 168(H) 108(H) -  BUN 6 - 20 mg/dL 10 7 -  Creatinine 0.44 - 1.00 mg/dL 0.78 0.84 0.74  Sodium 135 - 145 mmol/L 138 139 -  Potassium 3.5 - 5.1 mmol/L 3.8 3.7 -  Chloride 98 - 111 mmol/L 106 107 -  CO2 22 - 32 mmol/L 23 26 -  Calcium 8.9 - 10.3 mg/dL 9.0 8.4(L) -  Total Protein 6.5 - 8.1 g/dL 7.0 - -  Total Bilirubin 0.3 - 1.2 mg/dL 0.3 - -  Alkaline Phos 38 - 126 U/L 63 - -  AST 15 - 41 U/L 20 - -  ALT 0 - 44 U/L 13 - -       RADIOGRAPHIC STUDIES: I have personally reviewed the radiological images as listed and agreed with the findings in the report. DG CHEST PORT 1 VIEW  Result Date: 09/01/2019 CLINICAL DATA:  Left breast carcinoma with Port-A-Cath placement EXAM: PORTABLE CHEST 1 VIEW COMPARISON:  Chest CT August 25, 2019 FINDINGS: Port-A-Cath tip is in the superior vena cava. No pneumothorax. There is mild right base atelectasis. Lungs elsewhere clear. Heart size and pulmonary vascularity are normal. No adenopathy. Patient is status post mastectomy on the left with tissue expander on the left. There are surgical clips in left axilla. IMPRESSION: Postoperative changes involving the left breast/axilla. Port-A-Cath tip in superior vena cava. No pneumothorax. Right base atelectasis. Lungs elsewhere clear. Heart size normal. Electronically Signed   By: Lowella Grip III M.D.   On: 09/01/2019 13:19   DG Fluoro Guide CV Line-No Report  Result Date: 09/01/2019 Fluoroscopy was utilized by the requesting physician.  No radiographic interpretation.     ASSESSMENT & PLAN: Victoria Barker is a 39 y.o. female with    1Malignant neoplasm of upper-outer quadrant of left breast,invasive ductal carcinoma and DCIS, stageIIA,pT2N0M0, ER+/PR-/HER2+, GradeIII -She was diagnosed in 04/2019. She was found to have 2 left breast masses with DCIS and 1 mass with Invasive ductal carcinoma, her abnormal lymph node was biopsied and found to be negative. -Her invasive tumor was ER positive, PR negative, HER-2 equivocal by IHC, negative by [facility] initial biopsy -She started Tamoxifen before surgery   -On 07/13/19 she underwent left breast mastectomy, path showed complete surgical resection of her 3.4cm IDC with clear margins and node negative. Repeat HER2 of her surgical sample was equivocal by IHC, and positive by FISH.  HER-2 positive breast cancer is more aggressive and predicts high risk feature for cancer recurrence.  -  Oncotpye RS (was ordered before repeated HER2 test)  of 56 and 39% risk of recurrence with Tamoxifen or AI alone.  This is consistent with aggressive nature of HER-2 positive breast cancer. -Given her high risk features of Grade III, HER2 positive disease and RS of 56, Dr. Burr Medico recommended adjuvant intensive chemotherapy to reduce her high risk of recurrence with [place] (  Taxol, Carboplatin, Herceptin) q3weeks for 6 cycles, followed by anti-HER2 maintenance therapy with Herceptin q3weeks to complete 1 year of treatment. Given negative node, there is no benefit of adding Perjeta so it was not recommended, based on the APHINITY trail data (NEJM 2017; 377: 122-131). Goal is curative  -She would also benefit from Antiestrogen therapy to reduce risk of distant recurrence.  -Staging work up was negative except 0.7 x0.3 sclerotic lesion in right seventh rib, negative on bone scan. Overall negative for distant metastasis. Baseline ECHO is normal.  -she does not plan to have more children. We reviewed contraceptive measures during intercourse.  -Starting cycle 1 chemo today 09/02/19   2. Nicotine use, Smoking Cessation   -counseled on smoking cessation   3. Acid Reflux -Well controlled on Dexilant   Disposition:  Victoria Barker is clinically doing well. She has recovered from surgery. We reviewed her staging work up which is negative, and echo which is WNL with EF 65-70%. Labs adequate to proceed. We reviewed treatment plan and expected side effects, symptom management, and s/sx to call to report. She will begin adjuvant TCH today, inj on day 3. She will return for lab and toxicity check in 1 week, then again for cycle 2 in 3 weeks. She was given a letter requesting assistance with nutrition supplements from a cancer foundation.  All questions were answered. The patient knows to call the clinic with any problems, questions or concerns. No barriers to learning was detected.     Alla Feeling, NP 09/02/19

## 2019-09-02 ENCOUNTER — Encounter: Payer: Self-pay | Admitting: Nurse Practitioner

## 2019-09-02 ENCOUNTER — Inpatient Hospital Stay: Payer: BC Managed Care – PPO

## 2019-09-02 ENCOUNTER — Other Ambulatory Visit: Payer: Self-pay

## 2019-09-02 ENCOUNTER — Inpatient Hospital Stay (HOSPITAL_BASED_OUTPATIENT_CLINIC_OR_DEPARTMENT_OTHER): Payer: BC Managed Care – PPO | Admitting: Nurse Practitioner

## 2019-09-02 ENCOUNTER — Encounter: Payer: Self-pay | Admitting: *Deleted

## 2019-09-02 VITALS — BP 111/74 | HR 85 | Temp 98.3°F | Resp 17

## 2019-09-02 VITALS — BP 137/80 | HR 107 | Temp 98.5°F | Resp 20 | Ht 62.0 in | Wt 121.5 lb

## 2019-09-02 DIAGNOSIS — Z170 Estrogen receptor positive status [ER+]: Secondary | ICD-10-CM | POA: Diagnosis not present

## 2019-09-02 DIAGNOSIS — C50412 Malignant neoplasm of upper-outer quadrant of left female breast: Secondary | ICD-10-CM

## 2019-09-02 DIAGNOSIS — R000 Tachycardia, unspecified: Secondary | ICD-10-CM | POA: Diagnosis not present

## 2019-09-02 DIAGNOSIS — R110 Nausea: Secondary | ICD-10-CM

## 2019-09-02 DIAGNOSIS — Z5111 Encounter for antineoplastic chemotherapy: Secondary | ICD-10-CM | POA: Diagnosis not present

## 2019-09-02 DIAGNOSIS — Z5189 Encounter for other specified aftercare: Secondary | ICD-10-CM | POA: Diagnosis not present

## 2019-09-02 DIAGNOSIS — C50912 Malignant neoplasm of unspecified site of left female breast: Secondary | ICD-10-CM

## 2019-09-02 DIAGNOSIS — Z5112 Encounter for antineoplastic immunotherapy: Secondary | ICD-10-CM | POA: Diagnosis present

## 2019-09-02 LAB — CBC WITH DIFFERENTIAL (CANCER CENTER ONLY)
Abs Immature Granulocytes: 0.09 10*3/uL — ABNORMAL HIGH (ref 0.00–0.07)
Basophils Absolute: 0 10*3/uL (ref 0.0–0.1)
Basophils Relative: 0 %
Eosinophils Absolute: 0 10*3/uL (ref 0.0–0.5)
Eosinophils Relative: 0 %
HCT: 37.4 % (ref 36.0–46.0)
Hemoglobin: 12.7 g/dL (ref 12.0–15.0)
Immature Granulocytes: 1 %
Lymphocytes Relative: 6 %
Lymphs Abs: 1.1 10*3/uL (ref 0.7–4.0)
MCH: 30 pg (ref 26.0–34.0)
MCHC: 34 g/dL (ref 30.0–36.0)
MCV: 88.2 fL (ref 80.0–100.0)
Monocytes Absolute: 0.8 10*3/uL (ref 0.1–1.0)
Monocytes Relative: 4 %
Neutro Abs: 16.7 10*3/uL — ABNORMAL HIGH (ref 1.7–7.7)
Neutrophils Relative %: 89 %
Platelet Count: 244 10*3/uL (ref 150–400)
RBC: 4.24 MIL/uL (ref 3.87–5.11)
RDW: 13.7 % (ref 11.5–15.5)
WBC Count: 18.7 10*3/uL — ABNORMAL HIGH (ref 4.0–10.5)
nRBC: 0 % (ref 0.0–0.2)

## 2019-09-02 LAB — CMP (CANCER CENTER ONLY)
ALT: 13 U/L (ref 0–44)
AST: 20 U/L (ref 15–41)
Albumin: 3.9 g/dL (ref 3.5–5.0)
Alkaline Phosphatase: 63 U/L (ref 38–126)
Anion gap: 9 (ref 5–15)
BUN: 10 mg/dL (ref 6–20)
CO2: 23 mmol/L (ref 22–32)
Calcium: 9 mg/dL (ref 8.9–10.3)
Chloride: 106 mmol/L (ref 98–111)
Creatinine: 0.78 mg/dL (ref 0.44–1.00)
GFR, Est AFR Am: >60 mL/min (ref >60)
GFR, Estimated: >60 mL/min (ref >60)
Glucose, Bld: 168 mg/dL — ABNORMAL HIGH (ref 70–99)
Potassium: 3.8 mmol/L (ref 3.5–5.1)
Sodium: 138 mmol/L (ref 135–145)
Total Bilirubin: 0.3 mg/dL (ref 0.3–1.2)
Total Protein: 7 g/dL (ref 6.5–8.1)

## 2019-09-02 LAB — PREGNANCY, URINE: Preg Test, Ur: NEGATIVE

## 2019-09-02 MED ORDER — DIPHENHYDRAMINE HCL 25 MG PO CAPS
ORAL_CAPSULE | ORAL | Status: AC
  Filled 2019-09-02: qty 2

## 2019-09-02 MED ORDER — ACETAMINOPHEN 325 MG PO TABS
ORAL_TABLET | ORAL | Status: AC
  Filled 2019-09-02: qty 2

## 2019-09-02 MED ORDER — DEXAMETHASONE SODIUM PHOSPHATE 10 MG/ML IJ SOLN
INTRAMUSCULAR | Status: AC
  Filled 2019-09-02: qty 1

## 2019-09-02 MED ORDER — SODIUM CHLORIDE 0.9 % IV SOLN
150.0000 mg | Freq: Once | INTRAVENOUS | Status: AC
  Administered 2019-09-02: 150 mg via INTRAVENOUS
  Filled 2019-09-02: qty 150

## 2019-09-02 MED ORDER — HEPARIN SOD (PORK) LOCK FLUSH 100 UNIT/ML IV SOLN
500.0000 [IU] | Freq: Once | INTRAVENOUS | Status: AC | PRN
  Administered 2019-09-02: 500 [IU]
  Filled 2019-09-02: qty 5

## 2019-09-02 MED ORDER — TRASTUZUMAB-DKST CHEMO 150 MG IV SOLR
8.0000 mg/kg | Freq: Once | INTRAVENOUS | Status: AC
  Administered 2019-09-02: 420 mg via INTRAVENOUS
  Filled 2019-09-02: qty 20

## 2019-09-02 MED ORDER — PROCHLORPERAZINE MALEATE 10 MG PO TABS
10.0000 mg | ORAL_TABLET | Freq: Four times a day (QID) | ORAL | Status: DC | PRN
  Administered 2019-09-02: 10 mg via ORAL

## 2019-09-02 MED ORDER — PALONOSETRON HCL INJECTION 0.25 MG/5ML
0.2500 mg | Freq: Once | INTRAVENOUS | Status: AC
  Administered 2019-09-02: 0.25 mg via INTRAVENOUS

## 2019-09-02 MED ORDER — SODIUM CHLORIDE 0.9 % IV SOLN
630.0000 mg | Freq: Once | INTRAVENOUS | Status: AC
  Administered 2019-09-02: 630 mg via INTRAVENOUS
  Filled 2019-09-02: qty 63

## 2019-09-02 MED ORDER — SODIUM CHLORIDE 0.9 % IV SOLN
75.0000 mg/m2 | Freq: Once | INTRAVENOUS | Status: AC
  Administered 2019-09-02: 110 mg via INTRAVENOUS
  Filled 2019-09-02: qty 11

## 2019-09-02 MED ORDER — PROCHLORPERAZINE MALEATE 10 MG PO TABS
ORAL_TABLET | ORAL | Status: AC
  Filled 2019-09-02: qty 1

## 2019-09-02 MED ORDER — DIPHENHYDRAMINE HCL 25 MG PO CAPS
25.0000 mg | ORAL_CAPSULE | Freq: Once | ORAL | Status: AC
  Administered 2019-09-02: 25 mg via ORAL

## 2019-09-02 MED ORDER — SODIUM CHLORIDE 0.9 % IV SOLN
Freq: Once | INTRAVENOUS | Status: AC
  Filled 2019-09-02: qty 250

## 2019-09-02 MED ORDER — PALONOSETRON HCL INJECTION 0.25 MG/5ML
INTRAVENOUS | Status: AC
  Filled 2019-09-02: qty 5

## 2019-09-02 MED ORDER — ACETAMINOPHEN 325 MG PO TABS
650.0000 mg | ORAL_TABLET | Freq: Once | ORAL | Status: AC
  Administered 2019-09-02: 650 mg via ORAL

## 2019-09-02 MED ORDER — DEXAMETHASONE SODIUM PHOSPHATE 10 MG/ML IJ SOLN
10.0000 mg | Freq: Once | INTRAMUSCULAR | Status: AC
  Administered 2019-09-02: 10 mg via INTRAVENOUS

## 2019-09-02 MED ORDER — SODIUM CHLORIDE 0.9% FLUSH
10.0000 mL | INTRAVENOUS | Status: DC | PRN
  Administered 2019-09-02: 10 mL
  Filled 2019-09-02: qty 10

## 2019-09-02 NOTE — Patient Instructions (Signed)
COVID-19 Vaccine Information can be found at: [email or web address] For questions related to vaccine distribution or appointments, please email [email or web address] or call [phone].   [facility] Discharge Instructions for Patients Receiving Immunotherapy and Chemotherapy  Today you received the following immunotherapy and chemotherapy agents: Trastuzumab-dkst (Ogivri), Docetaxel (Taxotere), and Carboplatin (Paraplatin)  To help prevent nausea and vomiting after your treatment, we encourage you to take your nausea medication as directed by your provider.    If you develop nausea and vomiting that is not controlled by your nausea medication, call the clinic.   BELOW ARE SYMPTOMS THAT SHOULD BE REPORTED IMMEDIATELY:  *FEVER GREATER THAN 100.5 F  *CHILLS WITH OR WITHOUT FEVER  NAUSEA AND VOMITING THAT IS NOT CONTROLLED WITH YOUR NAUSEA MEDICATION  *UNUSUAL SHORTNESS OF BREATH  *UNUSUAL BRUISING OR BLEEDING  TENDERNESS IN MOUTH AND THROAT WITH OR WITHOUT PRESENCE OF ULCERS  *URINARY PROBLEMS  *BOWEL PROBLEMS  UNUSUAL RASH Items with * indicate a potential emergency and should be followed up as soon as possible.  Feel free to call the clinic should you have any questions or concerns. The clinic phone number is [phone].  Please show the [place] at check-in to the Emergency Department and triage nurse.  Trastuzumab injection for infusion What is this medicine? TRASTUZUMAB (tras TOO zoo mab) is a monoclonal antibody. It is used to treat breast cancer and stomach cancer. This medicine may be used for other purposes; ask your health care provider or pharmacist if you have questions. COMMON BRAND NAME(S): Herceptin, Galvin Proffer, Trazimera What should I tell my health care provider before I take this medicine? They need to know if you have any of these  conditions:  heart disease  heart failure  lung or breathing disease, like asthma  an unusual or allergic reaction to trastuzumab, benzyl alcohol, or other medications, foods, dyes, or preservatives  pregnant or trying to get pregnant  breast-feeding How should I use this medicine? This drug is given as an infusion into a vein. It is administered in a hospital or clinic by a specially trained health care professional. Talk to your pediatrician regarding the use of this medicine in children. This medicine is not approved for use in children. Overdosage: If you think you have taken too much of this medicine contact a poison control center or emergency room at once. NOTE: This medicine is only for you. Do not share this medicine with others. What if I miss a dose? It is important not to miss a dose. Call your doctor or health care professional if you are unable to keep an appointment. What may interact with this medicine? This medicine may interact with the following medications:  certain types of chemotherapy, such as daunorubicin, doxorubicin, epirubicin, and idarubicin This list may not describe all possible interactions. Give your health care provider a list of all the medicines, herbs, non-prescription drugs, or dietary supplements you use. Also tell them if you smoke, drink alcohol, or use illegal drugs. Some items may interact with your medicine. What should I watch for while using this medicine? Visit your doctor for checks on your progress. Report any side effects. Continue your course of treatment even though you feel ill unless your doctor tells you to stop. Call your doctor or health care professional for advice if you get a fever, chills or sore throat, or other symptoms of a cold or flu. Do not treat yourself. Try to avoid being around people who are sick.  You may experience fever, chills and shaking during your first infusion. These effects are usually mild and can be treated  with other medicines. Report any side effects during the infusion to your health care professional. Fever and chills usually do not happen with later infusions. Do not become pregnant while taking this medicine or for 7 months after stopping it. Women should inform their doctor if they wish to become pregnant or think they might be pregnant. Women of child-bearing potential will need to have a negative pregnancy test before starting this medicine. There is a potential for serious side effects to an unborn child. Talk to your health care professional or pharmacist for more information. Do not breast-feed an infant while taking this medicine or for 7 months after stopping it. Women must use effective birth control with this medicine. What side effects may I notice from receiving this medicine? Side effects that you should report to your doctor or health care professional as soon as possible:  allergic reactions like skin rash, itching or hives, swelling of the face, lips, or tongue  chest pain or palpitations  cough  dizziness  feeling faint or lightheaded, falls  fever  general ill feeling or flu-like symptoms  signs of worsening heart failure like breathing problems; swelling in your legs and feet  unusually weak or tired Side effects that usually do not require medical attention (report to your doctor or health care professional if they continue or are bothersome):  bone pain  changes in taste  diarrhea  joint pain  nausea/vomiting  weight loss This list may not describe all possible side effects. Call your doctor for medical advice about side effects. You may report side effects to FDA at 1-800-FDA-1088. Where should I keep my medicine? This drug is given in a hospital or clinic and will not be stored at home. NOTE: This sheet is a summary. It may not cover all possible information. If you have questions about this medicine, talk to your doctor, pharmacist, or health care  provider.  2020 Elsevier/Gold Standard (2016-06-04 14:37:52)  Docetaxel injection What is this medicine? DOCETAXEL (doe se TAX el) is a chemotherapy drug. It targets fast dividing cells, like cancer cells, and causes these cells to die. This medicine is used to treat many types of cancers like breast cancer, certain stomach cancers, head and neck cancer, lung cancer, and prostate cancer. This medicine may be used for other purposes; ask your health care provider or pharmacist if you have questions. COMMON BRAND NAME(S): Docefrez, Taxotere What should I tell my health care provider before I take this medicine? They need to know if you have any of these conditions:  infection (especially a virus infection such as chickenpox, cold sores, or herpes)  liver disease  low blood counts, like low white cell, platelet, or red cell counts  an unusual or allergic reaction to docetaxel, polysorbate 80, other chemotherapy agents, other medicines, foods, dyes, or preservatives  pregnant or trying to get pregnant  breast-feeding How should I use this medicine? This drug is given as an infusion into a vein. It is administered in a hospital or clinic by a specially trained health care professional. Talk to your pediatrician regarding the use of this medicine in children. Special care may be needed. Overdosage: If you think you have taken too much of this medicine contact a poison control center or emergency room at once. NOTE: This medicine is only for you. Do not share this medicine with others. What if  I miss a dose? It is important not to miss your dose. Call your doctor or health care professional if you are unable to keep an appointment. What may interact with this medicine?  aprepitant  certain antibiotics like erythromycin or clarithromycin  certain antivirals for HIV or hepatitis  certain medicines for fungal infections like fluconazole, itraconazole, ketoconazole, posaconazole, or  voriconazole  cimetidine  ciprofloxacin  conivaptan  cyclosporine  dronedarone  fluvoxamine  grapefruit juice  imatinib  verapamil This list may not describe all possible interactions. Give your health care provider a list of all the medicines, herbs, non-prescription drugs, or dietary supplements you use. Also tell them if you smoke, drink alcohol, or use illegal drugs. Some items may interact with your medicine. What should I watch for while using this medicine? Your condition will be monitored carefully while you are receiving this medicine. You will need important blood work done while you are taking this medicine. Call your doctor or health care professional for advice if you get a fever, chills or sore throat, or other symptoms of a cold or flu. Do not treat yourself. This drug decreases your body's ability to fight infections. Try to avoid being around people who are sick. Some products may contain alcohol. Ask your health care professional if this medicine contains alcohol. Be sure to tell all health care professionals you are taking this medicine. Certain medicines, like metronidazole and disulfiram, can cause an unpleasant reaction when taken with alcohol. The reaction includes flushing, headache, nausea, vomiting, sweating, and increased thirst. The reaction can last from 30 minutes to several hours. You may get drowsy or dizzy. Do not drive, use machinery, or do anything that needs mental alertness until you know how this medicine affects you. Do not stand or sit up quickly, especially if you are an older patient. This reduces the risk of dizzy or fainting spells. Alcohol may interfere with the effect of this medicine. Talk to your health care professional about your risk of cancer. You may be more at risk for certain types of cancer if you take this medicine. Do not become pregnant while taking this medicine or for 6 months after stopping it. Women should inform their doctor if  they wish to become pregnant or think they might be pregnant. There is a potential for serious side effects to an unborn child. Talk to your health care professional or pharmacist for more information. Do not breast-feed an infant while taking this medicine or for 1 week after stopping it. Males who get this medicine must use a condom during sex with females who can get pregnant. If you get a woman pregnant, the baby could have birth defects. The baby could die before they are born. You will need to continue wearing a condom for 3 months after stopping the medicine. Tell your health care provider right away if your partner becomes pregnant while you are taking this medicine. This may interfere with the ability to father a child. You should talk to your doctor or health care professional if you are concerned about your fertility. What side effects may I notice from receiving this medicine? Side effects that you should report to your doctor or health care professional as soon as possible:  allergic reactions like skin rash, itching or hives, swelling of the face, lips, or tongue  blurred vision  breathing problems  changes in vision  low blood counts - This drug may decrease the number of white blood cells, red blood cells and  platelets. You may be at increased risk for infections and bleeding.  nausea and vomiting  pain, redness or irritation at site where injected  pain, tingling, numbness in the hands or feet  redness, blistering, peeling, or loosening of the skin, including inside the mouth  signs of decreased platelets or bleeding - bruising, pinpoint red spots on the skin, black, tarry stools, nosebleeds  signs of decreased red blood cells - unusually weak or tired, fainting spells, lightheadedness  signs of infection - fever or chills, cough, sore throat, pain or difficulty passing urine  swelling of the ankle, feet, hands Side effects that usually do not require medical attention  (report to your doctor or health care professional if they continue or are bothersome):  constipation  diarrhea  fingernail or toenail changes  hair loss  loss of appetite  mouth sores  muscle pain This list may not describe all possible side effects. Call your doctor for medical advice about side effects. You may report side effects to FDA at 1-800-FDA-1088. Where should I keep my medicine? This drug is given in a hospital or clinic and will not be stored at home. NOTE: This sheet is a summary. It may not cover all possible information. If you have questions about this medicine, talk to your doctor, pharmacist, or health care provider.  2020 Elsevier/Gold Standard (2019-02-04 10:19:06)  Carboplatin injection What is this medicine? CARBOPLATIN (KAR boe pla tin) is a chemotherapy drug. It targets fast dividing cells, like cancer cells, and causes these cells to die. This medicine is used to treat ovarian cancer and many other cancers. This medicine may be used for other purposes; ask your health care provider or pharmacist if you have questions. COMMON BRAND NAME(S): Paraplatin What should I tell my health care provider before I take this medicine? They need to know if you have any of these conditions:  blood disorders  hearing problems  kidney disease  recent or ongoing radiation therapy  an unusual or allergic reaction to carboplatin, cisplatin, other chemotherapy, other medicines, foods, dyes, or preservatives  pregnant or trying to get pregnant  breast-feeding How should I use this medicine? This drug is usually given as an infusion into a vein. It is administered in a hospital or clinic by a specially trained health care professional. Talk to your pediatrician regarding the use of this medicine in children. Special care may be needed. Overdosage: If you think you have taken too much of this medicine contact a poison control center or emergency room at once. NOTE:  This medicine is only for you. Do not share this medicine with others. What if I miss a dose? It is important not to miss a dose. Call your doctor or health care professional if you are unable to keep an appointment. What may interact with this medicine?  medicines for seizures  medicines to increase blood counts like filgrastim, pegfilgrastim, sargramostim  some antibiotics like amikacin, gentamicin, neomycin, streptomycin, tobramycin  vaccines Talk to your doctor or health care professional before taking any of these medicines:  acetaminophen  aspirin  ibuprofen  ketoprofen  naproxen This list may not describe all possible interactions. Give your health care provider a list of all the medicines, herbs, non-prescription drugs, or dietary supplements you use. Also tell them if you smoke, drink alcohol, or use illegal drugs. Some items may interact with your medicine. What should I watch for while using this medicine? Your condition will be monitored carefully while you are receiving this medicine.  You will need important blood work done while you are taking this medicine. This drug may make you feel generally unwell. This is not uncommon, as chemotherapy can affect healthy cells as well as cancer cells. Report any side effects. Continue your course of treatment even though you feel ill unless your doctor tells you to stop. In some cases, you may be given additional medicines to help with side effects. Follow all directions for their use. Call your doctor or health care professional for advice if you get a fever, chills or sore throat, or other symptoms of a cold or flu. Do not treat yourself. This drug decreases your body's ability to fight infections. Try to avoid being around people who are sick. This medicine may increase your risk to bruise or bleed. Call your doctor or health care professional if you notice any unusual bleeding. Be careful brushing and flossing your teeth or using  a toothpick because you may get an infection or bleed more easily. If you have any dental work done, tell your dentist you are receiving this medicine. Avoid taking products that contain aspirin, acetaminophen, ibuprofen, naproxen, or ketoprofen unless instructed by your doctor. These medicines may hide a fever. Do not become pregnant while taking this medicine. Women should inform their doctor if they wish to become pregnant or think they might be pregnant. There is a potential for serious side effects to an unborn child. Talk to your health care professional or pharmacist for more information. Do not breast-feed an infant while taking this medicine. What side effects may I notice from receiving this medicine? Side effects that you should report to your doctor or health care professional as soon as possible:  allergic reactions like skin rash, itching or hives, swelling of the face, lips, or tongue  signs of infection - fever or chills, cough, sore throat, pain or difficulty passing urine  signs of decreased platelets or bleeding - bruising, pinpoint red spots on the skin, black, tarry stools, nosebleeds  signs of decreased red blood cells - unusually weak or tired, fainting spells, lightheadedness  breathing problems  changes in hearing  changes in vision  chest pain  high blood pressure  low blood counts - This drug may decrease the number of white blood cells, red blood cells and platelets. You may be at increased risk for infections and bleeding.  nausea and vomiting  pain, swelling, redness or irritation at the injection site  pain, tingling, numbness in the hands or feet  problems with balance, talking, walking  trouble passing urine or change in the amount of urine Side effects that usually do not require medical attention (report to your doctor or health care professional if they continue or are bothersome):  hair loss  loss of appetite  metallic taste in the mouth  or changes in taste This list may not describe all possible side effects. Call your doctor for medical advice about side effects. You may report side effects to FDA at 1-800-FDA-1088. Where should I keep my medicine? This drug is given in a hospital or clinic and will not be stored at home. NOTE: This sheet is a summary. It may not cover all possible information. If you have questions about this medicine, talk to your doctor, pharmacist, or health care provider.  2020 Elsevier/Gold Standard (2007-09-15 14:38:05)  Coronavirus (COVID-19) Are you at risk?  Are you at risk for the Coronavirus (COVID-19)?  To be considered HIGH RISK for Coronavirus (COVID-19), you have to meet the  following criteria:  . Traveled to [place], [place], [place], [place] or [place]; or in the [place] to [place], [place], [place], or [place]; and have fever, cough, and shortness of breath within the last 2 weeks of travel OR . Been in close contact with a person diagnosed with COVID-19 within the last 2 weeks and have fever, cough, and shortness of breath . IF YOU DO NOT MEET THESE CRITERIA, YOU ARE CONSIDERED LOW RISK FOR COVID-19.  What to do if you are HIGH RISK for COVID-19?  Marland Kitchen If you are having a medical emergency, call 911. . Seek medical care right away. Before you go to a doctor's office, urgent care or emergency department, call ahead and tell them about your recent travel, contact with someone diagnosed with COVID-19, and your symptoms. You should receive instructions from your physician's office regarding next steps of care.  . When you arrive at healthcare provider, tell the healthcare staff immediately you have returned from visiting [place], [place], [place], [place] or [place]; or traveled in the [place] to [place], [place], [place], or [place]; in the last two weeks or you have been in close contact with a person diagnosed with COVID-19 in the last 2 weeks.   . Tell the health care  staff about your symptoms: fever, cough and shortness of breath. . After you have been seen by a medical provider, you will be either: o Tested for (COVID-19) and discharged home on quarantine except to seek medical care if symptoms worsen, and asked to  - Stay home and avoid contact with others until you get your results (4-5 days)  - Avoid travel on public transportation if possible (such as bus, train, or airplane) or o Sent to the Emergency Department by EMS for evaluation, COVID-19 testing, and possible admission depending on your condition and test results.  What to do if you are LOW RISK for COVID-19?  Reduce your risk of any infection by using the same precautions used for avoiding the common cold or flu:  Marland Kitchen Wash your hands often with soap and warm water for at least 20 seconds.  If soap and water are not readily available, use an alcohol-based hand sanitizer with at least 60% alcohol.  . If coughing or sneezing, cover your mouth and nose by coughing or sneezing into the elbow areas of your shirt or coat, into a tissue or into your sleeve (not your hands). . Avoid shaking hands with others and consider head nods or verbal greetings only. . Avoid touching your eyes, nose, or mouth with unwashed hands.  . Avoid close contact with people who are sick. . Avoid places or events with large numbers of people in one location, like concerts or sporting events. . Carefully consider travel plans you have or are making. . If you are planning any travel outside or inside the [place], visit the CDC's Travelers' Health webpage for the latest health notices. . If you have some symptoms but not all symptoms, continue to monitor at home and seek medical attention if your symptoms worsen. . If you are having a medical emergency, call 911.   [place] / e-Visit: [email or web address]         [facility]:  [facility]: [id number]                   [facility]: [phone]

## 2019-09-02 NOTE — Progress Notes (Signed)
Met with patient at registration to introduce myself as Financial Resource Specialist and to offer available resources.  Discussed one-time $1000 [employer] grant and qualifications to assist with personal expenses while going through treatment.  Gave her my card if interested in applying and for any additional financial questions or concerns. 

## 2019-09-02 NOTE — Progress Notes (Signed)
About 40 min into Trastuzumab infusion patient stated she was experiencing mild feelings of flushing in her chest that came and went. She stated it wasn't alarming but she just wanted staff to be aware. Notified Dr. Burr Medico who instructed that was fine and to continue to monitor. Patient then stated that about every 20 minutes she had a mild wave of nausea. Lacie Burton-NP updated and received orders for 10 mg Compazine PO. Orders placed and medication administered. Will continue to monitor  Patient tolerated remainder of treatment without any new issues concerns. PAC deaccessed without issue, and patient finished final 2 hours of post-cool time for dignicap without difficulty.

## 2019-09-03 ENCOUNTER — Telehealth: Payer: Self-pay | Admitting: *Deleted

## 2019-09-03 ENCOUNTER — Telehealth: Payer: Self-pay | Admitting: Nurse Practitioner

## 2019-09-03 NOTE — Telephone Encounter (Signed)
Scheduled appt per 3/11 los.  Spoke with pt and she is aware of her appt date and time.

## 2019-09-04 ENCOUNTER — Inpatient Hospital Stay: Payer: BC Managed Care – PPO

## 2019-09-04 ENCOUNTER — Other Ambulatory Visit: Payer: Self-pay

## 2019-09-04 VITALS — BP 120/75 | HR 57 | Temp 99.1°F | Resp 20

## 2019-09-04 DIAGNOSIS — Z5111 Encounter for antineoplastic chemotherapy: Secondary | ICD-10-CM | POA: Diagnosis not present

## 2019-09-04 DIAGNOSIS — C50912 Malignant neoplasm of unspecified site of left female breast: Secondary | ICD-10-CM

## 2019-09-04 MED ORDER — PEGFILGRASTIM-JMDB 6 MG/0.6ML ~~LOC~~ SOSY
6.0000 mg | PREFILLED_SYRINGE | Freq: Once | SUBCUTANEOUS | Status: AC
  Administered 2019-09-04: 6 mg via SUBCUTANEOUS

## 2019-09-04 NOTE — Patient Instructions (Signed)

## 2019-09-06 ENCOUNTER — Ambulatory Visit: Payer: BC Managed Care – PPO

## 2019-09-09 ENCOUNTER — Inpatient Hospital Stay (HOSPITAL_BASED_OUTPATIENT_CLINIC_OR_DEPARTMENT_OTHER): Payer: BC Managed Care – PPO | Admitting: Nurse Practitioner

## 2019-09-09 ENCOUNTER — Inpatient Hospital Stay: Payer: BC Managed Care – PPO

## 2019-09-09 ENCOUNTER — Other Ambulatory Visit: Payer: Self-pay

## 2019-09-09 ENCOUNTER — Encounter: Payer: Self-pay | Admitting: Nurse Practitioner

## 2019-09-09 VITALS — BP 137/97 | HR 124 | Temp 98.5°F | Resp 20 | Ht 62.0 in | Wt 115.8 lb

## 2019-09-09 DIAGNOSIS — Z95828 Presence of other vascular implants and grafts: Secondary | ICD-10-CM

## 2019-09-09 DIAGNOSIS — C50412 Malignant neoplasm of upper-outer quadrant of left female breast: Secondary | ICD-10-CM

## 2019-09-09 DIAGNOSIS — Z170 Estrogen receptor positive status [ER+]: Secondary | ICD-10-CM

## 2019-09-09 DIAGNOSIS — Z5111 Encounter for antineoplastic chemotherapy: Secondary | ICD-10-CM | POA: Diagnosis not present

## 2019-09-09 LAB — CBC WITH DIFFERENTIAL (CANCER CENTER ONLY)
Abs Immature Granulocytes: 0.87 10*3/uL — ABNORMAL HIGH (ref 0.00–0.07)
Basophils Absolute: 0.1 10*3/uL (ref 0.0–0.1)
Basophils Relative: 1 %
Eosinophils Absolute: 0 10*3/uL (ref 0.0–0.5)
Eosinophils Relative: 0 %
HCT: 41.2 % (ref 36.0–46.0)
Hemoglobin: 14 g/dL (ref 12.0–15.0)
Immature Granulocytes: 8 %
Lymphocytes Relative: 19 %
Lymphs Abs: 2.1 10*3/uL (ref 0.7–4.0)
MCH: 29.2 pg (ref 26.0–34.0)
MCHC: 34 g/dL (ref 30.0–36.0)
MCV: 86 fL (ref 80.0–100.0)
Monocytes Absolute: 2.1 10*3/uL — ABNORMAL HIGH (ref 0.1–1.0)
Monocytes Relative: 19 %
Neutro Abs: 6 10*3/uL (ref 1.7–7.7)
Neutrophils Relative %: 53 %
Platelet Count: 172 10*3/uL (ref 150–400)
RBC: 4.79 MIL/uL (ref 3.87–5.11)
RDW: 12.2 % (ref 11.5–15.5)
WBC Count: 11.1 10*3/uL — ABNORMAL HIGH (ref 4.0–10.5)
nRBC: 0 % (ref 0.0–0.2)

## 2019-09-09 LAB — CMP (CANCER CENTER ONLY)
ALT: 15 U/L (ref 0–44)
AST: 17 U/L (ref 15–41)
Albumin: 3.7 g/dL (ref 3.5–5.0)
Alkaline Phosphatase: 72 U/L (ref 38–126)
Anion gap: 11 (ref 5–15)
BUN: 14 mg/dL (ref 6–20)
CO2: 27 mmol/L (ref 22–32)
Calcium: 9.4 mg/dL (ref 8.9–10.3)
Chloride: 97 mmol/L — ABNORMAL LOW (ref 98–111)
Creatinine: 0.77 mg/dL (ref 0.44–1.00)
GFR, Est AFR Am: >60 mL/min (ref >60)
GFR, Estimated: >60 mL/min (ref >60)
Glucose, Bld: 125 mg/dL — ABNORMAL HIGH (ref 70–99)
Potassium: 3.6 mmol/L (ref 3.5–5.1)
Sodium: 135 mmol/L (ref 135–145)
Total Bilirubin: 0.3 mg/dL (ref 0.3–1.2)
Total Protein: 7.4 g/dL (ref 6.5–8.1)

## 2019-09-09 MED ORDER — SODIUM CHLORIDE 0.9% FLUSH
10.0000 mL | INTRAVENOUS | Status: DC | PRN
  Administered 2019-09-09: 10 mL via INTRAVENOUS
  Filled 2019-09-09: qty 10

## 2019-09-09 MED ORDER — SODIUM CHLORIDE 0.9 % IV SOLN
Freq: Once | INTRAVENOUS | Status: AC
  Filled 2019-09-09: qty 250

## 2019-09-09 MED ORDER — SODIUM CHLORIDE 0.9% FLUSH
10.0000 mL | Freq: Once | INTRAVENOUS | Status: AC
  Administered 2019-09-09: 10 mL
  Filled 2019-09-09: qty 10

## 2019-09-09 MED ORDER — HEPARIN SOD (PORK) LOCK FLUSH 100 UNIT/ML IV SOLN
500.0000 [IU] | Freq: Once | INTRAVENOUS | Status: AC
  Administered 2019-09-09: 11:00:00 500 [IU] via INTRAVENOUS
  Filled 2019-09-09: qty 5

## 2019-09-09 MED ORDER — HEPARIN SOD (PORK) LOCK FLUSH 100 UNIT/ML IV SOLN
500.0000 [IU] | Freq: Once | INTRAVENOUS | Status: AC
  Administered 2019-09-09: 500 [IU]
  Filled 2019-09-09: qty 5

## 2019-09-09 NOTE — Progress Notes (Signed)
Pt received 500 ml NS, tolerated well.  HR improved, pt reports feeling much better after infusion and that she feels ok to go home at this time.  NP Lacie approved pt to be d/c, instructed pt to push oral fluids and to f/u as needed.  Pt verbalized understanding.  Pt showed bloody urine to RN after being d/c.  NP Lacie made aware, states that she will put in a note to have her desk nurse f/u with patient.  Pt states she is supposed to have her period w/in the next three days and denied any pain w/urination or possible signs of a UTI.  Pt provided with pad and instructed to call back if she experiences red urine again.  Pt verbalized understanding.

## 2019-09-09 NOTE — Progress Notes (Signed)
[facility]   [phone] [phone]   Clinic Follow up Note   Patient Care Team: [name], MD as PCP - General (Obstetrics and Gynecology) Mauro Kaufmann, RN as Oncology Nurse Navigator Rockwell Germany, RN as Oncology Nurse Navigator Donnie Mesa, MD as Consulting Physician (General Surgery) Truitt Merle, MD as Consulting Physician (Hematology) Eppie Gibson, MD as Attending Physician (Radiation Oncology) 09/09/2019  CHIEF COMPLAINT: F/u left breast cancer, toxicity check   SUMMARY OF ONCOLOGIC HISTORY: Oncology History  Malignant neoplasm of upper-outer quadrant of left breast in female, estrogen receptor positive ([facility]ia Barker)  04/21/2019 Mammogram   Diagnostic mammogram 04/21/19  IMPRESSION 1. findings highly suspicious for left breast cancer. There is a palpable irregular mass 4cm from nipple measuring 2.4x2.4x1.8cm in the 1:00 position axis of the left breast and pleomorphic  calcifications in the upper-outer quadrant as described above.  2. single left 0.9x0.5cm axillary lymph node with a mild/borderline thickened cortex.    04/28/2019 Cancer Staging   Staging form: Breast, AJCC 8th Edition - Clinical stage from 04/28/2019: Stage IIA (cT2, cN0, cM0, G2, ER+, PR-, HER2: Equivocal) - Signed by Truitt Merle, MD on 05/04/2019   04/28/2019 Initial Biopsy   Diagnosis 04/28/19 1. Breast, left, needle core biopsy, 1 o'clock position - INVASIVE DUCTAL CARCINOMA. - DUCTAL CARCINOMA IN SITU. - SEE COMMENT. 2. Lymph node, needle/core biopsy, left axilla - THERE IS NO EVIDENCE OF CARCINOMA IN 1 OF 1 LYMPH NODE (0/1). 3. Breast, left, needle core biopsy, upper, outer quadrant - DUCTAL CARCINOMA IN SITU WITH CALCIFICATIONS. - SEE COMMENT.   04/28/2019 Receptors her2   1. PROGNOSTIC INDICATORS Results: HER2 negative  Estrogen Receptor: 80%, POSITIVE, STRONG STAINING INTENSITY Progesterone Receptor: 0%, NEGATIVE Proliferation Marker Ki67: 35%      04/30/2019 Initial Diagnosis   Malignant neoplasm of upper-outer quadrant of left breast in female, estrogen receptor positive (Victoria Barker)    Genetic Testing   No pathogenic variants identified. 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 The final report date is 05/28/2019.  The STAT Breast cancer panel offered by [facility] includes sequencing and rearrangement analysis for the following 9 genes:  ATM, BRCA1, BRCA2, CDH1, CHEK2, PALB2, PTEN, STK11 and TP53.    The Common Hereditary Cancers Panel offered by [facility] includes sequencing and/or deletion duplication testing of the following 48 genes: APC, ATM, AXIN2, BARD1, BMPR1A, BRCA1, BRCA2, BRIP1, CDH1, CDKN2A (p14ARF), CDKN2A (p16INK4a), CKD4, CHEK2, CTNNA1, DICER1, EPCAM (Deletion/duplication testing only), GREM1 (promoter region deletion/duplication testing only), KIT, MEN1, MLH1, MSH2, MSH3, MSH6, MUTYH, NBN, NF1, NHTL1, PALB2, PDGFRA, PMS2, POLD1, POLE, PTEN, RAD50, RAD51C, RAD51D, RNF43, SDHB, SDHC, SDHD, SMAD4, SMARCA4. STK11, TP53, TSC1, TSC2, and VHL.  The following genes were evaluated for sequence changes only: SDHA and HOXB13 c.251G>A variant only.   05/11/2019 Breast MRI   IMPRESSION: 1. Enhancing mass and architectural distortion spanning approximately 6 x 3 x 5.3 cm in the upper outer left breast consistent with the patient's biopsy-proven site of invasive cancer. 2. Biopsy changes in the anterior left breast at the site of the patient's biopsy-proven ductal carcinoma in situ. 3. Suspicious changes within the upper-outer quadrant of the left breast spanning a total of 8 cm in the AP dimension. This includes the site of biopsy-proven DCIS anteriorly and suspicious enhancement extending to the level of the pectoralis muscle posteriorly. 4. Suspicious 7 mm enhancing mass in the far posterior slightly lateral left breast (image 138/212). 5.  No MRI evidence of malignancy  on the right. 6. No suspicious lymphadenopathy.     07/13/2019 Surgery   LEFT NIPPLE SPARING MASTECTOMY WITH SENTINEL LYMPH NODE BIOPSY and LEFT BREAST RECONSTRUCTION WITH PLACEMENT OF TISSUE EXPANDER AND ALLODERM by D.r Tsuie and Dr Iran Planas.    07/13/2019 Pathology Results   SURGICAL PATHOLOGY   FINAL MICROSCOPIC DIAGNOSIS:   A. LYMPH NODE, LEFT AXILLARY #1, SENTINEL, BIOPSY:  - One of one lymph nodes negative for carcinoma (0/1).   B. BREAST, LEFT, MASTECTOMY:  - Invasive ductal carcinoma, grade 3, spanning 3.4 cm.  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  - Invasive carcinoma is 0.2 cm from the posterior margin focally (not on  ink) and 0.4 cm from the  anterior margin focally (not on ink).  - In situ carcinoma is 0.4 cm from the posterior margin focally (not on  ink).  - Biopsy site x2.  - See oncology table.   C. NIPPLE, LEFT, BIOPSY:  - Benign nipple tissue.   D. LYMPH NODE, LEFT AXILLARY #2, SENTINEL, BIOPSY:  - One of one lymph nodes negative for carcinoma (0/1).     GROUP 1:  HER2 **POSITIVE**    07/13/2019 Oncotype testing   Ocotype score 56 with 39% risk of recurrence with Tmaoxifen or AI alone. There is a 15% benefit from adjuvant chemo.     07/13/2019 Cancer Staging   Staging form: Breast, AJCC 8th Edition - Pathologic stage from 07/13/2019: Stage IIA (pT2, pN0, cM0, G3, ER+, PR-, HER2+, Oncotype DX score: 56) - Signed by Truitt Merle, MD on 08/20/2019    Chemotherapy   TCHP (Taxol, Carboplatin, Herceptin, Perjeta) q3weeks for 6 cycles starting 08/30/19, followed by anti-HER2 maintenance therapy with Herceptin q3weeks to complete 1 year of treatment.     Invasive ductal carcinoma of breast, female, left ([place])  07/13/2019 Initial Diagnosis   Invasive ductal carcinoma of breast, female, left ([place])   09/02/2019 -  Chemotherapy   The patient had palonosetron (ALOXI) injection 0.25 mg, 0.25 mg, Intravenous,  Once, 1 of 6 cycles Administration: 0.25 mg  (09/02/2019) pegfilgrastim-jmdb (FULPHILA) injection 6 mg, 6 mg, Subcutaneous,  Once, 1 of 6 cycles Administration: 6 mg (09/04/2019) CARBOplatin (PARAPLATIN) 630 mg in sodium chloride 0.9 % 250 mL chemo infusion, 629.4 mg (100 % of original dose 629.4 mg), Intravenous,  Once, 1 of 6 cycles Dose modification:   (original dose 629.4 mg, Cycle 1) Administration: 630 mg (09/02/2019) DOCEtaxel (TAXOTERE) 110 mg in sodium chloride 0.9 % 250 mL chemo infusion, 75 mg/m2 = 110 mg, Intravenous,  Once, 1 of 6 cycles Administration: 110 mg (09/02/2019) fosaprepitant (EMEND) 150 mg in sodium chloride 0.9 % 145 mL IVPB, 150 mg, Intravenous,  Once, 1 of 6 cycles Administration: 150 mg (09/02/2019) trastuzumab-dkst (OGIVRI) 420 mg in sodium chloride 0.9 % 250 mL chemo infusion, 8 mg/kg = 420 mg, Intravenous,  Once, 1 of 17 cycles Administration: 420 mg (09/02/2019)  for chemotherapy treatment.      CURRENT THERAPY:  [place] q3weeks for 6 cycles starting 09/02/19, followed by maintenance Herceptinto complete one year therapy  INTERVAL HISTORY: Ms. Furey returns for f/u as scheduled. She completed first [facility] on 09/02/19. She felt well without any changes until she received Udenyca injection. After that she developed moderate to severe low back pain - taking tylenol and NSAIDs q4 hours - hot flashes, and constipation. She ended up taking 5 laxatives and now stools are runny, but forming back up. Her stomach feels empty  and tight but not nauseous. She has been eating except on 3/16 when she only had ice chips. Her appetite is present, taste is changed. Has lost 6 lbs. Energy is adequate. Denies rash, mucositis, neuropathy, chills, cough, chest pain, dyspnea, or leg edema. She has felt warm, but no observed fever. She is mildly anxious today, feels a little "shaky," says she doesn't like feeling sick.    MEDICAL HISTORY:  Past Medical History:  Diagnosis Date  . Cancer ([place])    Left breast Ca  . GERD  (gastroesophageal reflux disease)   . Scoliosis   . Sickle cell trait ([place])     SURGICAL HISTORY: Past Surgical History:  Procedure Laterality Date  . BREAST RECONSTRUCTION WITH PLACEMENT OF TISSUE EXPANDER AND ALLODERM Left 07/13/2019   Procedure: LEFT BREAST RECONSTRUCTION WITH PLACEMENT OF TISSUE EXPANDER AND ALLODERM;  Surgeon: Irene Limbo, MD;  Location: [facility];  Service: Plastics;  Laterality: Left;  . DEBRIDEMENT AND CLOSURE WOUND Left 07/30/2019   Procedure: DEBRIDEMENT LEFT MASTECTOMY FLAP;  Surgeon: Irene Limbo, MD;  Location: [facility];  Service: Plastics;  Laterality: Left;  . INDUCED ABORTION  2017  . NIPPLE SPARING MASTECTOMY WITH SENTINEL LYMPH NODE BIOPSY Left 07/13/2019   Procedure: LEFT NIPPLE SPARING MASTECTOMY WITH SENTINEL LYMPH NODE BIOPSY;  Surgeon: Donnie Mesa, MD;  Location: [facility];  Service: General;  Laterality: Left;  . PORTACATH PLACEMENT Right 09/01/2019   Procedure: INSERTION PORT-A-CATH WITH ULTRASOUND GUIDANCE;  Surgeon: Donnie Mesa, MD;  Location: [facility];  Service: General;  Laterality: Right;  . TISSUE EXPANDER PLACEMENT Left 07/30/2019   Procedure: TISSUE EXPANSION LEFT CHEST;  Surgeon: Irene Limbo, MD;  Location: [facility];  Service: Plastics;  Laterality: Left;    I have reviewed the social history and family history with the patient and they are unchanged from previous note.  ALLERGIES:  is allergic to bactrim [sulfamethoxazole-trimethoprim].  MEDICATIONS:  Current Outpatient Medications  Medication Sig Dispense Refill  . dexlansoprazole (DEXILANT) 60 MG capsule Take 60 mg by mouth daily.    Marland Kitchen HYDROcodone-acetaminophen (NORCO/VICODIN) 5-325 MG tablet Take 1 tablet by mouth every 6 (six) hours as needed for moderate pain.    . Lactobacillus-Inulin ([place] PO) Take 1 capsule by mouth daily as needed (digestive health.).     Marland Kitchen lidocaine-prilocaine (EMLA) cream  Apply 1 application topically as needed. 30 g 0  . metroNIDAZOLE (METROGEL) 0.75 % vaginal gel Place 9.73 application vaginally daily.    . Multiple Vitamin (MULTIVITAMIN WITH MINERALS) TABS tablet Take 1 tablet by mouth 3 (three) times a week. WOMEN'S ONE-A-DAY    . ondansetron (ZOFRAN) 8 MG tablet Take 1 tablet (8 mg total) by mouth 2 (two) times daily as needed for refractory nausea / vomiting. Start on day 3 after chemo. 30 tablet 1  . prochlorperazine (COMPAZINE) 10 MG tablet Take 1 tablet (10 mg total) by mouth every 6 (six) hours as needed (Nausea or vomiting). 30 tablet 1   No current facility-administered medications for this visit.    PHYSICAL EXAMINATION: ECOG PERFORMANCE STATUS: 1 - Symptomatic but completely ambulatory  Vitals:   09/09/19 1109 09/09/19 1125  BP: (!) 137/97   Pulse: (!) 127 (!) 124  Resp: 20   Temp: 98.5 F (36.9 C)   SpO2: 100%    Filed Weights   09/09/19 1109  Weight: 115 lb 12.8 oz (52.5 kg)    GENERAL:alert, no distress and comfortable SKIN: no rash. Skin is  warm EYES: sclera clear LUNGS: clear with normal breathing effort HEART: tachycardic, regular rhythm. No lower extremity edema ABDOMEN: abdomen soft, non-tender and normal bowel sounds NEURO: alert & oriented x 3 with fluent speech, normal gait PAC without erythema. Left breast incision closed, no erythema or drainage   LABORATORY DATA:  I have reviewed the data as listed CBC Latest Ref Rng & Units 09/09/2019 09/02/2019 07/14/2019  WBC 4.0 - 10.5 K/uL 11.1(H) 18.7(H) 14.6(H)  Hemoglobin 12.0 - 15.0 g/dL 14.0 12.7 10.8(L)  Hematocrit 36.0 - 46.0 % 41.2 37.4 32.5(L)  Platelets 150 - 400 K/uL 172 244 179     CMP Latest Ref Rng & Units 09/09/2019 09/02/2019 07/14/2019  Glucose 70 - 99 mg/dL 125(H) 168(H) 108(H)  BUN 6 - 20 mg/dL 14 10 7   Creatinine 0.44 - 1.00 mg/dL 0.77 0.78 0.84  Sodium 135 - 145 mmol/L 135 138 139  Potassium 3.5 - 5.1 mmol/L 3.6 3.8 3.7  Chloride 98 - 111 mmol/L 97(L)  106 107  CO2 22 - 32 mmol/L 27 23 26   Calcium 8.9 - 10.3 mg/dL 9.4 9.0 8.4(L)  Total Protein 6.5 - 8.1 g/dL 7.4 7.0 -  Total Bilirubin 0.3 - 1.2 mg/dL 0.3 0.3 -  Alkaline Phos 38 - 126 U/L 72 63 -  AST 15 - 41 U/L 17 20 -  ALT 0 - 44 U/L 15 13 -      RADIOGRAPHIC STUDIES: I have personally reviewed the radiological images as listed and agreed with the findings in the report. No results found.   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 She was found to have2 left breast masses with DCIS and 1 mass with Invasive ductal carcinoma, her abnormal lymph node was biopsied and found to be negative. -Her invasive tumor was ER positive, PR negative, HER-2 equivocal by IHC, negative by [facility] initial biopsy -She started Tamoxifen before surgery  -On 07/13/19 she underwent left breast mastectomy, path showedcomplete surgical resection of her 3.4cmIDCwith clear margins and node negative. Repeat HER2of her surgical sample was equivocal by IHC, and positive by FISH.HER-2 positive breast cancer ismore aggressive andpredictshigh risk feature for cancer recurrence.  - Oncotpye RS(was ordered before repeated HER2 test) of 56 and 39% risk of recurrence with Tamoxifen or AI alone. This is consistent with aggressive nature of HER-2 positive breast cancer. -Given her high risk features of Grade III, HER2 positive disease and RS of 56, Dr. Burr Medico recommended adjuvant intensive chemotherapy to reduce her high risk of recurrence with TCH (Taxol, Carboplatin, Herceptin) q3weeks for 6 cycles, followed by anti-HER2 maintenance therapy with Herceptin q3weeks to complete 1 year of treatment.Given negative node,there is no benefit of addingPerjetaso it was not recommended, based on the APHINITY trail data (NEJM  2017; 377: 122-131). Goal is curative  -She would also benefit from Antiestrogen therapy to reduce risk of distant recurrence.  -Staging work up was negative except 0.7 x0.3 sclerotic lesion in right seventh rib, negative on bone scan. Overall negative for distant metastasis. Baseline ECHO is normal.  -she does not plan to have more children. We reviewed contraceptive measures during intercourse.  -She began adjuvant [place] on 09/02/19   2. Nicotine use, Smoking Cessation  -counseled on smoking cessation   3. Acid Reflux -Well controlled on Dexilant  Disposition:  Ms. Zenz appears stable. She completed cycle 1 TCH on 09/02/19.  She tolerated well except hot flashes, constipation, and severe bone pain after Udenyca injection. She eventually had successful BM after laxatives, we reviewed symptom management for future chemo cycles. Bone pain is improving with claritin, tylenol/NSAIDs. I encouraged her to check for fever before taking these meds, as her counts are expected to drop soon. Labs reviewed, no cytopenias, electrolyte abnormalities, or renal/liver dysfunction. Labs stable.   She is tachycardic on exam, I suspect she is mildly dehydrated per her report of poor po intake, although she is not hypotensive. She was given 500 cc NS over 1 hour, HR improved. I encouraged her to eat and drink more, and try to gain weight back before next cycle. She began her menstrual cycle before leaving today.   She knows to call clinic if she worsens or fails to improve in the next few days. We reviewed s/sx to report. She will f/u in 2 weeks with cycle 2, or sooner if needed.   All questions were answered. The patient knows to call the clinic with any problems, questions or concerns. No barriers to learning was detected.     Alla Feeling, NP 09/09/19

## 2019-09-09 NOTE — Patient Instructions (Signed)

## 2019-09-10 ENCOUNTER — Telehealth: Payer: Self-pay | Admitting: Nurse Practitioner

## 2019-09-10 NOTE — Telephone Encounter (Signed)
No los per 3/18. 

## 2019-09-13 NOTE — Progress Notes (Signed)
[facility]   Telephone:(336) [phone] Fax:(336) [phone]   Clinic Follow up Note   Patient Care Team: [name], MD as PCP - General (Obstetrics and Gynecology) Mauro Kaufmann, RN as Oncology Nurse Navigator Rockwell Germany, RN as Oncology Nurse Navigator Donnie Mesa, MD as Consulting Physician (General Surgery) Truitt Merle, MD as Consulting Physician (Hematology) Eppie Gibson, MD as Attending Physician (Radiation Oncology)  Date of Service:  09/23/2019  CHIEF COMPLAINT: F/u of left breast cancer   SUMMARY OF ONCOLOGIC HISTORY: Oncology History Overview Note  Cancer Staging Malignant neoplasm of upper-outer quadrant of left breast in female, estrogen receptor positive ([place]) Staging form: Breast, AJCC 8th Edition - Clinical stage from 04/28/2019: Stage IIA (cT2, cN0, cM0, G2, ER+, PR-, HER2: Equivocal) - Signed by Truitt Merle, MD on 05/04/2019 - Pathologic stage from 07/13/2019: Stage IIA (pT2, pN0, cM0, G3, ER+, PR-, HER2+, Oncotype DX score: 29) - Signed by Truitt Merle, MD on 08/20/2019    Malignant neoplasm of upper-outer quadrant of left breast in female, estrogen receptor positive ([place])  04/21/2019 Mammogram   Diagnostic mammogram 04/21/19  IMPRESSION 1. findings highly suspicious for left breast cancer. There is a palpable irregular mass 4cm from nipple measuring 2.4x2.4x1.8cm in the 1:00 position axis of the left breast and pleomorphic  calcifications in the upper-outer quadrant as described above.  2. single left 0.9x0.5cm axillary lymph node with a mild/borderline thickened cortex.    04/28/2019 Cancer Staging   Staging form: Breast, AJCC 8th Edition - Clinical stage from 04/28/2019: Stage IIA (cT2, cN0, cM0, G2, ER+, PR-, HER2: Equivocal) - Signed by Truitt Merle, MD on 05/04/2019   04/28/2019 Initial Biopsy   Diagnosis 04/28/19 1. Breast, left, needle core biopsy, 1 o'clock position - INVASIVE DUCTAL CARCINOMA. - DUCTAL CARCINOMA IN SITU. - SEE  COMMENT. 2. Lymph node, needle/core biopsy, left axilla - THERE IS NO EVIDENCE OF CARCINOMA IN 1 OF 1 LYMPH NODE (0/1). 3. Breast, left, needle core biopsy, upper, outer quadrant - DUCTAL CARCINOMA IN SITU WITH CALCIFICATIONS. - SEE COMMENT.   04/28/2019 Receptors her2   1. PROGNOSTIC INDICATORS Results: HER2 negative  Estrogen Receptor: 80%, POSITIVE, STRONG STAINING INTENSITY Progesterone Receptor: 0%, NEGATIVE Proliferation Marker Ki67: 35%    04/30/2019 Initial Diagnosis   Malignant neoplasm of upper-outer quadrant of left breast in female, estrogen receptor positive ([name])    Genetic Testing   No pathogenic variants identified. 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 The final report date is 05/28/2019.  The STAT Breast cancer panel offered by [facility] includes sequencing and rearrangement analysis for the following 9 genes:  ATM, BRCA1, BRCA2, CDH1, CHEK2, PALB2, PTEN, STK11 and TP53.    The Common Hereditary Cancers Panel offered by [facility] includes sequencing and/or deletion duplication testing of the following 48 genes: APC, ATM, AXIN2, BARD1, BMPR1A, BRCA1, BRCA2, BRIP1, CDH1, CDKN2A (p14ARF), CDKN2A (p16INK4a), CKD4, CHEK2, CTNNA1, DICER1, EPCAM (Deletion/duplication testing only), GREM1 (promoter region deletion/duplication testing only), KIT, MEN1, MLH1, MSH2, MSH3, MSH6, MUTYH, NBN, NF1, NHTL1, PALB2, PDGFRA, PMS2, POLD1, POLE, PTEN, RAD50, RAD51C, RAD51D, RNF43, SDHB, SDHC, SDHD, SMAD4, SMARCA4. STK11, TP53, TSC1, TSC2, and VHL.  The following genes were evaluated for sequence changes only: SDHA and HOXB13 c.251G>A variant only.   05/11/2019 Breast MRI   IMPRESSION: 1. Enhancing mass and architectural distortion spanning approximately 6 x 3 x 5.3 cm in the upper outer left breast consistent with the patient's biopsy-proven site of  invasive cancer. 2. Biopsy changes in the anterior left breast at the  site of the patient's biopsy-proven ductal carcinoma in situ. 3. Suspicious changes within the upper-outer quadrant of the left breast spanning a total of 8 cm in the AP dimension. This includes the site of biopsy-proven DCIS anteriorly and suspicious enhancement extending to the level of the pectoralis muscle posteriorly. 4. Suspicious 7 mm enhancing mass in the far posterior slightly lateral left breast (image 138/212). 5. No MRI evidence of malignancy on the right. 6. No suspicious lymphadenopathy.     07/13/2019 Surgery   LEFT NIPPLE SPARING MASTECTOMY WITH SENTINEL LYMPH NODE BIOPSY and LEFT BREAST RECONSTRUCTION WITH PLACEMENT OF TISSUE EXPANDER AND ALLODERM by D.r Tsuie and Dr Iran Planas.    07/13/2019 Pathology Results   SURGICAL PATHOLOGY   FINAL MICROSCOPIC DIAGNOSIS:   A. LYMPH NODE, LEFT AXILLARY #1, SENTINEL, BIOPSY:  - One of one lymph nodes negative for carcinoma (0/1).   B. BREAST, LEFT, MASTECTOMY:  - Invasive ductal carcinoma, grade 3, spanning 3.4 cm.  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  - Invasive carcinoma is 0.2 cm from the posterior margin focally (not on  ink) and 0.4 cm from the  anterior margin focally (not on ink).  - In situ carcinoma is 0.4 cm from the posterior margin focally (not on  ink).  - Biopsy site x2.  - See oncology table.   C. NIPPLE, LEFT, BIOPSY:  - Benign nipple tissue.   D. LYMPH NODE, LEFT AXILLARY #2, SENTINEL, BIOPSY:  - One of one lymph nodes negative for carcinoma (0/1).     GROUP 1:  HER2 **POSITIVE**    07/13/2019 Oncotype testing   Ocotype score 56 with 39% risk of recurrence with Tmaoxifen or AI alone. There is a 15% benefit from adjuvant chemo.     07/13/2019 Cancer Staging   Staging form: Breast, AJCC 8th Edition - Pathologic stage from 07/13/2019: Stage IIA (pT2, pN0, cM0, G3, ER+, PR-, HER2+, Oncotype DX score: 56) - Signed by Truitt Merle, MD on 08/20/2019   08/25/2019 Echocardiogram   Baseline Echo   IMPRESSIONS     1. Left ventricular ejection fraction, by estimation, is 65 to 70%. The  left ventricle has normal function. The left ventricle has no regional  wall motion abnormalities. Left ventricular diastolic parameters were  normal.   2. Right ventricular systolic function is normal. The right ventricular  size is normal. There is normal pulmonary artery systolic pressure.   3. Left atrial size was mildly dilated.   4. The mitral valve is normal in structure and function. Trivial mitral  valve regurgitation.   5. The aortic valve is tricuspid. Aortic valve regurgitation is not  visualized.   6. The inferior vena cava is normal in size with greater than 50%  respiratory variability, suggesting right atrial pressure of 3 mmHg.   7. The aortic valve is not well seen. It is trileaflet. There appears to  be some calcification associated with the non-coronary cusp but this is  not well seen.    08/25/2019 Imaging   Whole body Bone Scan  IMPRESSION: No significant abnormality identified.   08/25/2019 Imaging   CT CAP w Contrast  IMPRESSION: 1. No findings of metastatic disease to the chest, abdomen, or pelvis. 2. 0.7 by 0.3 cm sclerotic lesion in the right seventh rib laterally is likely benign but merits surveillance. 3. Mild sclerosis along the sacroiliac joints bilaterally, query mild chronic sacroiliitis.  09/01/2019 Procedure   INSERTION PORT-A-CATH WITH ULTRASOUND GUIDANCE by Dr. Georgette Dover   09/02/2019 -  Chemotherapy   TCHP (Taxol, Carboplatin, Herceptin, Perjeta) q3weeks for 6 cycles starting 09/02/19, followed by anti-HER2 maintenance therapy with Herceptin q3weeks to complete 1 year of treatment.     Invasive ductal carcinoma of breast, female, left ([place])  07/13/2019 Initial Diagnosis   Invasive ductal carcinoma of breast, female, left ([place])      CURRENT THERAPY:  [place] q3weeks for 6 cycles starting 09/02/19, followed by maintenance Herceptin to complete one year  therapy   INTERVAL HISTORY:  Victoria Barker is here for a follow up and treatment. She presents to the clinic alone. She feels her first cycle went well until she got her GCSF injections. She had back pain, cramping and constipation and resolved after laxatives and IV Fluids. She notes she took aleve for back pain and this stopped after 2-3 days. She notes she has been able to eat well and gain weight. She notes yesterday her skin has been peeling and has used Aquaphor and vasaline. She now has BM every other day again and continues stool softener. She notes she did have a period the in the beginning of her cycle. She notes only very mild skin rash on her lips which has resolved.     REVIEW OF SYSTEMS:   Constitutional: Denies fevers, chills or abnormal weight loss Eyes: Denies blurriness of vision Ears, nose, mouth, throat, and face: Denies mucositis or sore throat Respiratory: Denies cough, dyspnea or wheezes Cardiovascular: Denies palpitation, chest discomfort or lower extremity swelling Gastrointestinal:  Denies nausea, heartburn or change in bowel habits Skin: Denies abnormal skin rashes (+) Dry skin  Lymphatics: Denies new lymphadenopathy or easy bruising Neurological:Denies numbness, tingling or new weaknesses Behavioral/Psych: Mood is stable, no new changes  All other systems were reviewed with the patient and are negative.  MEDICAL HISTORY:  Past Medical History:  Diagnosis Date  . Cancer ([place])    Left breast Ca  . GERD (gastroesophageal reflux disease)   . Scoliosis   . Sickle cell trait ([place])     SURGICAL HISTORY: Past Surgical History:  Procedure Laterality Date  . BREAST RECONSTRUCTION WITH PLACEMENT OF TISSUE EXPANDER AND ALLODERM Left 07/13/2019   Procedure: LEFT BREAST RECONSTRUCTION WITH PLACEMENT OF TISSUE EXPANDER AND ALLODERM;  Surgeon: Irene Limbo, MD;  Location: [facility];  Service: Plastics;  Laterality: Left;  . DEBRIDEMENT AND CLOSURE WOUND Left 07/30/2019     Procedure: DEBRIDEMENT LEFT MASTECTOMY FLAP;  Surgeon: Irene Limbo, MD;  Location: [facility];  Service: Plastics;  Laterality: Left;  . INDUCED ABORTION  2017  . NIPPLE SPARING MASTECTOMY WITH SENTINEL LYMPH NODE BIOPSY Left 07/13/2019   Procedure: LEFT NIPPLE SPARING MASTECTOMY WITH SENTINEL LYMPH NODE BIOPSY;  Surgeon: Donnie Mesa, MD;  Location: [facility];  Service: General;  Laterality: Left;  . PORTACATH PLACEMENT Right 09/01/2019   Procedure: INSERTION PORT-A-CATH WITH ULTRASOUND GUIDANCE;  Surgeon: Donnie Mesa, MD;  Location: [facility];  Service: General;  Laterality: Right;  . TISSUE EXPANDER PLACEMENT Left 07/30/2019   Procedure: TISSUE EXPANSION LEFT CHEST;  Surgeon: Irene Limbo, MD;  Location: [facility];  Service: Plastics;  Laterality: Left;    I have reviewed the social history and family history with the patient and they are unchanged from previous note.  ALLERGIES:  is allergic to bactrim [sulfamethoxazole-trimethoprim].  MEDICATIONS:  Current Outpatient Medications  Medication Sig Dispense Refill  . dexamethasone (DECADRON) 4  MG tablet     . dexlansoprazole (DEXILANT) 60 MG capsule Take 60 mg by mouth daily.    Marland Kitchen HYDROcodone-acetaminophen (NORCO/VICODIN) 5-325 MG tablet Take 1 tablet by mouth every 6 (six) hours as needed for moderate pain.    . Lactobacillus-Inulin ([place] PO) Take 1 capsule by mouth daily as needed (digestive health.).     Marland Kitchen lidocaine-prilocaine (EMLA) cream Apply 1 application topically as needed. 30 g 0  . metroNIDAZOLE (METROGEL) 0.75 % vaginal gel Place 2.68 application vaginally daily.    . Multiple Vitamin (MULTIVITAMIN WITH MINERALS) TABS tablet Take 1 tablet by mouth 3 (three) times a week. WOMEN'S ONE-A-DAY    . ondansetron (ZOFRAN) 8 MG tablet Take 1 tablet (8 mg total) by mouth 2 (two) times daily as needed for refractory nausea / vomiting. Start on day 3 after  chemo. 30 tablet 1  . prochlorperazine (COMPAZINE) 10 MG tablet Take 1 tablet (10 mg total) by mouth every 6 (six) hours as needed (Nausea or vomiting). 30 tablet 1   No current facility-administered medications for this visit.    PHYSICAL EXAMINATION: ECOG PERFORMANCE STATUS: 0 - Asymptomatic  Vitals:   09/23/19 0829  BP: 124/77  Pulse: 96  Resp: 18  Temp: 98.2 F (36.8 C)  SpO2: 100%   Filed Weights   09/23/19 0829  Weight: 127 lb 1.6 oz (57.7 kg)    GENERAL:alert, no distress and comfortable SKIN: skin color, texture, turgor are normal, no rashes or significant lesions EYES: normal, Conjunctiva are pink and non-injected, sclera clear  NECK: supple, thyroid normal size, non-tender, without nodularity LYMPH:  no palpable lymphadenopathy in the cervical, axillary  LUNGS: clear to auscultation and percussion with normal breathing effort HEART: regular rate & rhythm and no murmurs and no lower extremity edema ABDOMEN:abdomen soft, non-tender and normal bowel sounds Musculoskeletal:no cyanosis of digits and no clubbing  NEURO: alert & oriented x 3 with fluent speech, no focal motor/sensory deficits BREAST: S/p left mastectomy and reconstruction. Incision healed well. Right breast exam benign.   LABORATORY DATA:  I have reviewed the data as listed CBC Latest Ref Rng & Units 09/23/2019 09/09/2019 09/02/2019  WBC 4.0 - 10.5 K/uL 10.2 11.1(H) 18.7(H)  Hemoglobin 12.0 - 15.0 g/dL 11.0(L) 14.0 12.7  Hematocrit 36.0 - 46.0 % 32.5(L) 41.2 37.4  Platelets 150 - 400 K/uL 381 172 244     CMP Latest Ref Rng & Units 09/23/2019 09/09/2019 09/02/2019  Glucose 70 - 99 mg/dL 112(H) 125(H) 168(H)  BUN 6 - 20 mg/dL _0 Creatinine 0.44 - 1.00 mg/dL 0.71 0.77 0.78  Sodium 135 - 145 mmol/L 137 135 138  Potassium 3.5 - 5.1 mmol/L 4.0 3.6 3.8  Chloride 98 - 111 mmol/L 105 97(L) 106  CO2 22 - 32 mmol/L _1 Calcium 8.9 - 10.3 mg/dL 9.1 9.4 9.0  Total Protein 6.5 - 8.1 g/dL 7.1 7.4 7.0   Total Bilirubin 0.3 - 1.2 mg/dL 0.3 0.3 0.3  Alkaline Phos 38 - 126 U/L 70 72 63  AST 15 - 41 U/L _2 ALT 0 - 44 U/L 54(H) 15 13      RADIOGRAPHIC STUDIES: I have personally reviewed the radiological images as listed and agreed with the findings in the report. No results found.   ASSESSMENT & PLAN:  Victoria Barker is a 39 y.o. female with    1.Malignant neoplasm of upper-outer quadrant of left breast,invasive ductal carcinoma and DCIS, stageIIA,pT2N0M0, ER+/PR-/HER2+, GradeIII -  She was diagnosed in 04/2019. She was found to have 2 left breast masses with DCIS and 1 mass with Invasive ductal carcinoma, her abnormal lymph node was biopsied and found to be negative. -Her invasive tumor was ER positive, PR negative, HER-2 equivocal by IHC, negative by [facility] initial biopsy  -On 07/13/19 she underwent left breast mastectomy with Dr. Prince Solian and reconstruction with Dr. Iran Planas. We discussed her path which showed complete surgical resection of her 3.4cm IDC with clear margins and node negative. We discussed HER-2 positive breast cancer is more aggressive and predicts high risk feature for cancer recurrence.  -She was found to be HER2 positive and Oncotype testing with recurrence score of 56. This is consistent with aggressive nature of HER-2 positive breast cancer. -To reduce her high risk of recurrence I started her on adjuvant chemotherapy with [facility] q3weeks for 6 cycles starting 09/02/19, followed by maintenance Herceptin to complete one year therapy. Given negative node, there is no benefit of adding Perjeta so I do not recommend, based on the APHINITY trail data (NEJM 2017; 377: 122-131) -She would also benefit from Antiestrogen therapy to reduce risk of distant recurrence. Given she is s/p mastectomy and node negative she does not need adjuvant RT. After chemo she can proceed with Breast reconstruction.  -S/p C1 TCH she tolerated well overall with dry skin and mild skin toxicity,  constipation and back pain from GCSF injection, joint soreness and mild hot flashes. She was able to maintain appetite. I reviewed symptom management with her.  -Labs reviewed and adequate to proceed with C2 [place] today.  -F?u in 3 weeks. I encouraged her to watch for fever   2. Nicotine use, Smoking Cessation   -She smoked cigarettes for 2 years, less than 1/2ppd. She currently smokes jewel pod containing nicotine for the past 3 years.  -Smoking cessation has been reviewed with her. She is willing to quit.   3. Acid Reflux -Well controlled on Dexilant  4. Constipation  -She will continues daily prophylactic stool softener    PLAN: -Labs reviewed and adequate to proceed with C2 TCH today with GCSF tomorrow  -Lab, flush, f/u and Chemo TCH in 3, 6 weeks, with GCSF on day 2.    No problem-specific Assessment & Plan notes found for this encounter.   No orders of the defined types were placed in this encounter.  All questions were answered. The patient knows to call the clinic with any problems, questions or concerns. No barriers to learning was detected. The total time spent in the appointment was 30 minutes.     Truitt Merle, MD 09/23/2019   I, Joslyn Devon, am acting as scribe for Truitt Merle, MD.   I have reviewed the above documentation for accuracy and completeness, and I agree with the above.

## 2019-09-17 NOTE — Progress Notes (Signed)
Pharmacist Chemotherapy Monitoring - Follow Up Assessment    I verify that I have reviewed each item in the below checklist:  . Regimen for the patient is scheduled for the appropriate day and plan matches scheduled date. Marland Kitchen Appropriate non-routine labs are ordered dependent on drug ordered. . If applicable, additional medications reviewed and ordered per protocol based on lifetime cumulative doses and/or treatment regimen.   Plan for follow-up and/or issues identified: Yes . I-vent associated with next due treatment: Yes . MD and/or nursing notified: No  Philomena Course 09/17/2019 4:22 PM

## 2019-09-20 ENCOUNTER — Telehealth: Payer: Self-pay | Admitting: Licensed Clinical Social Worker

## 2019-09-20 NOTE — Telephone Encounter (Signed)
Received update from [facility] of paperwork needs. Patient called to follow-up as well. Clarified how to turn in additional forms via email to the [employer]. If patient unable to email the forms, she can bring in hard copies when she is in infusion on Thursday and this CSW will scan and send them for her.   Edwinna Areola Mahlon Gabrielle, LCSW

## 2019-09-22 ENCOUNTER — Encounter: Payer: Self-pay | Admitting: *Deleted

## 2019-09-23 ENCOUNTER — Inpatient Hospital Stay: Payer: BC Managed Care – PPO

## 2019-09-23 ENCOUNTER — Other Ambulatory Visit: Payer: Self-pay

## 2019-09-23 ENCOUNTER — Inpatient Hospital Stay: Payer: BC Managed Care – PPO | Attending: Hematology

## 2019-09-23 ENCOUNTER — Encounter: Payer: Self-pay | Admitting: Hematology

## 2019-09-23 ENCOUNTER — Inpatient Hospital Stay: Payer: BC Managed Care – PPO | Admitting: Licensed Clinical Social Worker

## 2019-09-23 ENCOUNTER — Inpatient Hospital Stay (HOSPITAL_BASED_OUTPATIENT_CLINIC_OR_DEPARTMENT_OTHER): Payer: BC Managed Care – PPO | Admitting: Hematology

## 2019-09-23 VITALS — BP 124/77 | HR 96 | Temp 98.2°F | Resp 18 | Ht 62.0 in | Wt 127.1 lb

## 2019-09-23 DIAGNOSIS — C50412 Malignant neoplasm of upper-outer quadrant of left female breast: Secondary | ICD-10-CM

## 2019-09-23 DIAGNOSIS — Z5112 Encounter for antineoplastic immunotherapy: Secondary | ICD-10-CM | POA: Insufficient documentation

## 2019-09-23 DIAGNOSIS — Z170 Estrogen receptor positive status [ER+]: Secondary | ICD-10-CM | POA: Diagnosis not present

## 2019-09-23 DIAGNOSIS — Z5189 Encounter for other specified aftercare: Secondary | ICD-10-CM | POA: Insufficient documentation

## 2019-09-23 DIAGNOSIS — Z95828 Presence of other vascular implants and grafts: Secondary | ICD-10-CM

## 2019-09-23 DIAGNOSIS — C50912 Malignant neoplasm of unspecified site of left female breast: Secondary | ICD-10-CM

## 2019-09-23 DIAGNOSIS — Z5111 Encounter for antineoplastic chemotherapy: Secondary | ICD-10-CM | POA: Diagnosis present

## 2019-09-23 LAB — CBC WITH DIFFERENTIAL (CANCER CENTER ONLY)
Abs Immature Granulocytes: 0.05 10*3/uL (ref 0.00–0.07)
Basophils Absolute: 0 10*3/uL (ref 0.0–0.1)
Basophils Relative: 0 %
Eosinophils Absolute: 0 10*3/uL (ref 0.0–0.5)
Eosinophils Relative: 0 %
HCT: 32.5 % — ABNORMAL LOW (ref 36.0–46.0)
Hemoglobin: 11 g/dL — ABNORMAL LOW (ref 12.0–15.0)
Immature Granulocytes: 1 %
Lymphocytes Relative: 10 %
Lymphs Abs: 1.1 10*3/uL (ref 0.7–4.0)
MCH: 30.1 pg (ref 26.0–34.0)
MCHC: 33.8 g/dL (ref 30.0–36.0)
MCV: 89 fL (ref 80.0–100.0)
Monocytes Absolute: 0.4 10*3/uL (ref 0.1–1.0)
Monocytes Relative: 4 %
Neutro Abs: 8.7 10*3/uL — ABNORMAL HIGH (ref 1.7–7.7)
Neutrophils Relative %: 85 %
Platelet Count: 381 10*3/uL (ref 150–400)
RBC: 3.65 MIL/uL — ABNORMAL LOW (ref 3.87–5.11)
RDW: 13.7 % (ref 11.5–15.5)
WBC Count: 10.2 10*3/uL (ref 4.0–10.5)
nRBC: 0 % (ref 0.0–0.2)

## 2019-09-23 LAB — CMP (CANCER CENTER ONLY)
ALT: 54 U/L — ABNORMAL HIGH (ref 0–44)
AST: 26 U/L (ref 15–41)
Albumin: 3.8 g/dL (ref 3.5–5.0)
Alkaline Phosphatase: 70 U/L (ref 38–126)
Anion gap: 8 (ref 5–15)
BUN: 13 mg/dL (ref 6–20)
CO2: 24 mmol/L (ref 22–32)
Calcium: 9.1 mg/dL (ref 8.9–10.3)
Chloride: 105 mmol/L (ref 98–111)
Creatinine: 0.71 mg/dL (ref 0.44–1.00)
GFR, Est AFR Am: >60 mL/min (ref >60)
GFR, Estimated: >60 mL/min (ref >60)
Glucose, Bld: 112 mg/dL — ABNORMAL HIGH (ref 70–99)
Potassium: 4 mmol/L (ref 3.5–5.1)
Sodium: 137 mmol/L (ref 135–145)
Total Bilirubin: 0.3 mg/dL (ref 0.3–1.2)
Total Protein: 7.1 g/dL (ref 6.5–8.1)

## 2019-09-23 LAB — PREGNANCY, URINE: Preg Test, Ur: NEGATIVE

## 2019-09-23 MED ORDER — PROCHLORPERAZINE MALEATE 10 MG PO TABS
10.0000 mg | ORAL_TABLET | Freq: Once | ORAL | Status: AC
  Administered 2019-09-23: 10 mg via ORAL

## 2019-09-23 MED ORDER — SODIUM CHLORIDE 0.9 % IV SOLN
150.0000 mg | Freq: Once | INTRAVENOUS | Status: AC
  Administered 2019-09-23: 150 mg via INTRAVENOUS
  Filled 2019-09-23: qty 150

## 2019-09-23 MED ORDER — ACETAMINOPHEN 325 MG PO TABS
650.0000 mg | ORAL_TABLET | Freq: Once | ORAL | Status: AC
  Administered 2019-09-23: 650 mg via ORAL

## 2019-09-23 MED ORDER — DIPHENHYDRAMINE HCL 25 MG PO CAPS
25.0000 mg | ORAL_CAPSULE | Freq: Once | ORAL | Status: AC
  Administered 2019-09-23: 10:00:00 25 mg via ORAL

## 2019-09-23 MED ORDER — DIPHENHYDRAMINE HCL 25 MG PO CAPS
ORAL_CAPSULE | ORAL | Status: AC
  Filled 2019-09-23: qty 1

## 2019-09-23 MED ORDER — PROCHLORPERAZINE MALEATE 10 MG PO TABS
ORAL_TABLET | ORAL | Status: AC
  Filled 2019-09-23: qty 1

## 2019-09-23 MED ORDER — HEPARIN SOD (PORK) LOCK FLUSH 100 UNIT/ML IV SOLN
500.0000 [IU] | Freq: Once | INTRAVENOUS | Status: AC | PRN
  Administered 2019-09-23: 500 [IU]
  Filled 2019-09-23: qty 5

## 2019-09-23 MED ORDER — SODIUM CHLORIDE 0.9% FLUSH
10.0000 mL | INTRAVENOUS | Status: DC | PRN
  Administered 2019-09-23: 08:00:00 10 mL via INTRAVENOUS
  Filled 2019-09-23: qty 10

## 2019-09-23 MED ORDER — DEXAMETHASONE SODIUM PHOSPHATE 10 MG/ML IJ SOLN
INTRAMUSCULAR | Status: AC
  Filled 2019-09-23: qty 1

## 2019-09-23 MED ORDER — DEXAMETHASONE SODIUM PHOSPHATE 10 MG/ML IJ SOLN
10.0000 mg | Freq: Once | INTRAMUSCULAR | Status: AC
  Administered 2019-09-23: 10 mg via INTRAVENOUS

## 2019-09-23 MED ORDER — SODIUM CHLORIDE 0.9 % IV SOLN
Freq: Once | INTRAVENOUS | Status: AC
  Filled 2019-09-23: qty 250

## 2019-09-23 MED ORDER — PALONOSETRON HCL INJECTION 0.25 MG/5ML
0.2500 mg | Freq: Once | INTRAVENOUS | Status: AC
  Administered 2019-09-23: 0.25 mg via INTRAVENOUS

## 2019-09-23 MED ORDER — PALONOSETRON HCL INJECTION 0.25 MG/5ML
INTRAVENOUS | Status: AC
  Filled 2019-09-23: qty 5

## 2019-09-23 MED ORDER — ACETAMINOPHEN 325 MG PO TABS
ORAL_TABLET | ORAL | Status: AC
  Filled 2019-09-23: qty 2

## 2019-09-23 MED ORDER — SODIUM CHLORIDE 0.9 % IV SOLN
630.0000 mg | Freq: Once | INTRAVENOUS | Status: AC
  Administered 2019-09-23: 12:00:00 630 mg via INTRAVENOUS
  Filled 2019-09-23: qty 63

## 2019-09-23 MED ORDER — SODIUM CHLORIDE 0.9% FLUSH
10.0000 mL | INTRAVENOUS | Status: DC | PRN
  Administered 2019-09-23: 10 mL
  Filled 2019-09-23: qty 10

## 2019-09-23 MED ORDER — TRASTUZUMAB-DKST CHEMO 150 MG IV SOLR
6.0000 mg/kg | Freq: Once | INTRAVENOUS | Status: AC
  Administered 2019-09-23: 315 mg via INTRAVENOUS
  Filled 2019-09-23: qty 15

## 2019-09-23 MED ORDER — SODIUM CHLORIDE 0.9 % IV SOLN
75.0000 mg/m2 | Freq: Once | INTRAVENOUS | Status: AC
  Administered 2019-09-23: 11:00:00 110 mg via INTRAVENOUS
  Filled 2019-09-23: qty 11

## 2019-09-23 NOTE — Progress Notes (Signed)
09/23/19  Add prochlorperazine 10 mg po to treatment plan due to trastuzumab issues with cycle #1 and resolution when prochlorperazine given.   Added to future plans and today.  T.O. Dr Lavonda Jumbo, PharmD

## 2019-09-23 NOTE — Progress Notes (Signed)
[facility] Clinical Social Work Progress Note  Met with patient in infusion. She sent requested documents to [employer]. CSW made copy of updated financial information form and added to records.     Edwinna Areola Taji Sather, LCSW

## 2019-09-23 NOTE — Patient Instructions (Signed)
COVID-19 Vaccine Information can be found at: [email or web address] For questions related to vaccine distribution or appointments, please email [email or web address] or call [phone].   [facility] Discharge Instructions for Patients Receiving Immunotherapy and Chemotherapy  Today you received the following immunotherapy and chemotherapy agents: Trastuzumab-dkst (Ogivri), Docetaxel (Taxotere), and Carboplatin (Paraplatin)  To help prevent nausea and vomiting after your treatment, we encourage you to take your nausea medication as directed by your provider.    If you develop nausea and vomiting that is not controlled by your nausea medication, call the clinic.   BELOW ARE SYMPTOMS THAT SHOULD BE REPORTED IMMEDIATELY:  *FEVER GREATER THAN 100.5 F  *CHILLS WITH OR WITHOUT FEVER  NAUSEA AND VOMITING THAT IS NOT CONTROLLED WITH YOUR NAUSEA MEDICATION  *UNUSUAL SHORTNESS OF BREATH  *UNUSUAL BRUISING OR BLEEDING  TENDERNESS IN MOUTH AND THROAT WITH OR WITHOUT PRESENCE OF ULCERS  *URINARY PROBLEMS  *BOWEL PROBLEMS  UNUSUAL RASH Items with * indicate a potential emergency and should be followed up as soon as possible.  Feel free to call the clinic should you have any questions or concerns. The clinic phone number is [phone].  Please show the [place] at check-in to the Emergency Department and triage nurse.

## 2019-09-25 ENCOUNTER — Inpatient Hospital Stay: Payer: BC Managed Care – PPO

## 2019-09-25 ENCOUNTER — Other Ambulatory Visit: Payer: Self-pay

## 2019-09-25 VITALS — BP 108/70 | HR 98 | Temp 99.1°F | Resp 18

## 2019-09-25 DIAGNOSIS — C50912 Malignant neoplasm of unspecified site of left female breast: Secondary | ICD-10-CM

## 2019-09-25 DIAGNOSIS — Z5111 Encounter for antineoplastic chemotherapy: Secondary | ICD-10-CM | POA: Diagnosis not present

## 2019-09-25 MED ORDER — PEGFILGRASTIM-JMDB 6 MG/0.6ML ~~LOC~~ SOSY
6.0000 mg | PREFILLED_SYRINGE | Freq: Once | SUBCUTANEOUS | Status: AC
  Administered 2019-09-25: 13:00:00 6 mg via SUBCUTANEOUS

## 2019-10-05 ENCOUNTER — Encounter: Payer: Self-pay | Admitting: Licensed Clinical Social Worker

## 2019-10-05 NOTE — Progress Notes (Addendum)
[place] CSW Progress Note  Clinical Social Worker contacted patient by phone to inform her of new fund open through [place] for co-pay assistance. Gave information via phone and e-mailed link to website for patient to apply online.  Patient also received funding from [employer] who will help pay various bills directly over the next 3 months.    Edwinna Areola Theador Jezewski , LCSW

## 2019-10-08 NOTE — Progress Notes (Signed)
Pharmacist Chemotherapy Monitoring - Follow Up Assessment    I verify that I have reviewed each item in the below checklist:  . Regimen for the patient is scheduled for the appropriate day and plan matches scheduled date. Marland Kitchen Appropriate non-routine labs are ordered dependent on drug ordered. . If applicable, additional medications reviewed and ordered per protocol based on lifetime cumulative doses and/or treatment regimen.   Plan for follow-up and/or issues identified: No . I-vent associated with next due treatment: Yes . MD and/or nursing notified: No   Kennith Center, Pharm.D., CPP 10/08/2019@1 :35 PM

## 2019-10-14 ENCOUNTER — Encounter: Payer: Self-pay | Admitting: *Deleted

## 2019-10-14 ENCOUNTER — Inpatient Hospital Stay (HOSPITAL_BASED_OUTPATIENT_CLINIC_OR_DEPARTMENT_OTHER): Payer: BC Managed Care – PPO | Admitting: Nurse Practitioner

## 2019-10-14 ENCOUNTER — Inpatient Hospital Stay: Payer: BC Managed Care – PPO

## 2019-10-14 ENCOUNTER — Telehealth: Payer: Self-pay | Admitting: *Deleted

## 2019-10-14 ENCOUNTER — Other Ambulatory Visit: Payer: Self-pay

## 2019-10-14 ENCOUNTER — Encounter: Payer: Self-pay | Admitting: Nurse Practitioner

## 2019-10-14 VITALS — BP 125/81 | HR 91 | Temp 97.8°F | Resp 16 | Ht 62.0 in | Wt 133.4 lb

## 2019-10-14 DIAGNOSIS — Z170 Estrogen receptor positive status [ER+]: Secondary | ICD-10-CM

## 2019-10-14 DIAGNOSIS — C50412 Malignant neoplasm of upper-outer quadrant of left female breast: Secondary | ICD-10-CM | POA: Diagnosis not present

## 2019-10-14 DIAGNOSIS — Z5111 Encounter for antineoplastic chemotherapy: Secondary | ICD-10-CM | POA: Diagnosis not present

## 2019-10-14 DIAGNOSIS — C50912 Malignant neoplasm of unspecified site of left female breast: Secondary | ICD-10-CM

## 2019-10-14 DIAGNOSIS — Z95828 Presence of other vascular implants and grafts: Secondary | ICD-10-CM

## 2019-10-14 LAB — CMP (CANCER CENTER ONLY)
ALT: 44 U/L (ref 0–44)
AST: 23 U/L (ref 15–41)
Albumin: 3.4 g/dL — ABNORMAL LOW (ref 3.5–5.0)
Alkaline Phosphatase: 83 U/L (ref 38–126)
Anion gap: 8 (ref 5–15)
BUN: 10 mg/dL (ref 6–20)
CO2: 25 mmol/L (ref 22–32)
Calcium: 9 mg/dL (ref 8.9–10.3)
Chloride: 105 mmol/L (ref 98–111)
Creatinine: 0.69 mg/dL (ref 0.44–1.00)
GFR, Est AFR Am: >60 mL/min (ref >60)
GFR, Estimated: >60 mL/min (ref >60)
Glucose, Bld: 121 mg/dL — ABNORMAL HIGH (ref 70–99)
Potassium: 3.8 mmol/L (ref 3.5–5.1)
Sodium: 138 mmol/L (ref 135–145)
Total Bilirubin: <0.2 mg/dL — ABNORMAL LOW (ref 0.3–1.2)
Total Protein: 6.6 g/dL (ref 6.5–8.1)

## 2019-10-14 LAB — CBC WITH DIFFERENTIAL (CANCER CENTER ONLY)
Abs Immature Granulocytes: 0.06 10*3/uL (ref 0.00–0.07)
Basophils Absolute: 0 10*3/uL (ref 0.0–0.1)
Basophils Relative: 0 %
Eosinophils Absolute: 0 10*3/uL (ref 0.0–0.5)
Eosinophils Relative: 0 %
HCT: 31.8 % — ABNORMAL LOW (ref 36.0–46.0)
Hemoglobin: 10.7 g/dL — ABNORMAL LOW (ref 12.0–15.0)
Immature Granulocytes: 1 %
Lymphocytes Relative: 11 %
Lymphs Abs: 1.4 10*3/uL (ref 0.7–4.0)
MCH: 30.3 pg (ref 26.0–34.0)
MCHC: 33.6 g/dL (ref 30.0–36.0)
MCV: 90.1 fL (ref 80.0–100.0)
Monocytes Absolute: 0.9 10*3/uL (ref 0.1–1.0)
Monocytes Relative: 7 %
Neutro Abs: 10.4 10*3/uL — ABNORMAL HIGH (ref 1.7–7.7)
Neutrophils Relative %: 81 %
Platelet Count: 249 10*3/uL (ref 150–400)
RBC: 3.53 MIL/uL — ABNORMAL LOW (ref 3.87–5.11)
RDW: 15.1 % (ref 11.5–15.5)
WBC Count: 12.7 10*3/uL — ABNORMAL HIGH (ref 4.0–10.5)
nRBC: 0 % (ref 0.0–0.2)

## 2019-10-14 LAB — PREGNANCY, URINE: Preg Test, Ur: NEGATIVE

## 2019-10-14 MED ORDER — PROCHLORPERAZINE MALEATE 10 MG PO TABS
ORAL_TABLET | ORAL | Status: AC
  Filled 2019-10-14: qty 1

## 2019-10-14 MED ORDER — SODIUM CHLORIDE 0.9% FLUSH
10.0000 mL | INTRAVENOUS | Status: DC | PRN
  Administered 2019-10-14: 10 mL via INTRAVENOUS
  Filled 2019-10-14: qty 10

## 2019-10-14 MED ORDER — HEPARIN SOD (PORK) LOCK FLUSH 100 UNIT/ML IV SOLN
500.0000 [IU] | Freq: Once | INTRAVENOUS | Status: AC | PRN
  Administered 2019-10-14: 500 [IU]
  Filled 2019-10-14: qty 5

## 2019-10-14 MED ORDER — ACETAMINOPHEN 325 MG PO TABS
ORAL_TABLET | ORAL | Status: AC
  Filled 2019-10-14: qty 2

## 2019-10-14 MED ORDER — SODIUM CHLORIDE 0.9 % IV SOLN
10.0000 mg | Freq: Once | INTRAVENOUS | Status: AC
  Administered 2019-10-14: 10:00:00 10 mg via INTRAVENOUS
  Filled 2019-10-14: qty 10

## 2019-10-14 MED ORDER — TRASTUZUMAB-DKST CHEMO 150 MG IV SOLR: 6.0000 mg/kg | Freq: Once | INTRAVENOUS | Status: DC

## 2019-10-14 MED ORDER — SODIUM CHLORIDE 0.9 % IV SOLN
150.0000 mg | Freq: Once | INTRAVENOUS | Status: AC
  Administered 2019-10-14: 150 mg via INTRAVENOUS
  Filled 2019-10-14: qty 150

## 2019-10-14 MED ORDER — PALONOSETRON HCL INJECTION 0.25 MG/5ML
0.2500 mg | Freq: Once | INTRAVENOUS | Status: AC
  Administered 2019-10-14: 10:00:00 0.25 mg via INTRAVENOUS

## 2019-10-14 MED ORDER — TRASTUZUMAB-DKST CHEMO 150 MG IV SOLR
6.0000 mg/kg | Freq: Once | INTRAVENOUS | Status: AC
  Administered 2019-10-14: 357 mg via INTRAVENOUS
  Filled 2019-10-14: qty 17

## 2019-10-14 MED ORDER — PALONOSETRON HCL INJECTION 0.25 MG/5ML
INTRAVENOUS | Status: AC
  Filled 2019-10-14: qty 5

## 2019-10-14 MED ORDER — SODIUM CHLORIDE 0.9 % IV SOLN
Freq: Once | INTRAVENOUS | Status: AC
  Filled 2019-10-14: qty 250

## 2019-10-14 MED ORDER — SODIUM CHLORIDE 0.9 % IV SOLN
75.0000 mg/m2 | Freq: Once | INTRAVENOUS | Status: AC
  Administered 2019-10-14: 110 mg via INTRAVENOUS
  Filled 2019-10-14: qty 11

## 2019-10-14 MED ORDER — SODIUM CHLORIDE 0.9% FLUSH
10.0000 mL | INTRAVENOUS | Status: DC | PRN
  Administered 2019-10-14: 16:00:00 10 mL
  Filled 2019-10-14: qty 10

## 2019-10-14 MED ORDER — SODIUM CHLORIDE 0.9 % IV SOLN
630.0000 mg | Freq: Once | INTRAVENOUS | Status: AC
  Administered 2019-10-14: 630 mg via INTRAVENOUS
  Filled 2019-10-14: qty 63

## 2019-10-14 MED ORDER — ACETAMINOPHEN 325 MG PO TABS
650.0000 mg | ORAL_TABLET | Freq: Once | ORAL | Status: AC
  Administered 2019-10-14: 650 mg via ORAL

## 2019-10-14 MED ORDER — DIPHENHYDRAMINE HCL 25 MG PO CAPS
ORAL_CAPSULE | ORAL | Status: AC
  Filled 2019-10-14: qty 1

## 2019-10-14 MED ORDER — PROCHLORPERAZINE MALEATE 10 MG PO TABS
10.0000 mg | ORAL_TABLET | Freq: Once | ORAL | Status: AC
  Administered 2019-10-14: 10 mg via ORAL

## 2019-10-14 MED ORDER — DIPHENHYDRAMINE HCL 25 MG PO CAPS
25.0000 mg | ORAL_CAPSULE | Freq: Once | ORAL | Status: AC
  Administered 2019-10-14: 10:00:00 25 mg via ORAL

## 2019-10-14 NOTE — Patient Instructions (Signed)
[facility] Discharge Instructions for Patients Receiving Chemotherapy  Today you received the following chemotherapy agents: trastuzumab, docetaxel, and carboplatin.  To help prevent nausea and vomiting after your treatment, we encourage you to take your nausea medication as directed.   If you develop nausea and vomiting that is not controlled by your nausea medication, call the clinic.   BELOW ARE SYMPTOMS THAT SHOULD BE REPORTED IMMEDIATELY:  *FEVER GREATER THAN 100.5 F  *CHILLS WITH OR WITHOUT FEVER  NAUSEA AND VOMITING THAT IS NOT CONTROLLED WITH YOUR NAUSEA MEDICATION  *UNUSUAL SHORTNESS OF BREATH  *UNUSUAL BRUISING OR BLEEDING  TENDERNESS IN MOUTH AND THROAT WITH OR WITHOUT PRESENCE OF ULCERS  *URINARY PROBLEMS  *BOWEL PROBLEMS  UNUSUAL RASH Items with * indicate a potential emergency and should be followed up as soon as possible.  Feel free to call the clinic should you have any questions or concerns. The clinic phone number is [phone].  Please show the CHEMO ALERT CARD at check-in to the Emergency Department and triage nurse.   

## 2019-10-14 NOTE — Progress Notes (Signed)
Adjust Ogivri dose today for weight gain per Dr. Burr Medico.   Demetrius Charity, PharmD, BCPS, [place] Pharmacist Pharmacy Phone: [phone] 10/14/2019

## 2019-10-14 NOTE — Progress Notes (Signed)
Per Demetrius Charity, RPH, OK to release pre-meds and Ogiviri without waiting for CMP results. Will wait to release chemotherapy.

## 2019-10-14 NOTE — Progress Notes (Signed)
[place]   Telephone:([phone]) [phone] Fax:(336) [phone]   Clinic Follow up Note   Patient Care Team: [name], MD as PCP - General (Obstetrics and Gynecology) Mauro Kaufmann, RN as Oncology Nurse Navigator Rockwell Germany, RN as Oncology Nurse Navigator Donnie Mesa, MD as Consulting Physician (General Surgery) Truitt Merle, MD as Consulting Physician (Hematology) Eppie Gibson, MD as Attending Physician (Radiation Oncology) 10/14/2019  CHIEF COMPLAINT: F/u left breast cancer   SUMMARY OF ONCOLOGIC HISTORY: Oncology History Overview Note  Cancer Staging Malignant neoplasm of upper-outer quadrant of left breast in female, estrogen receptor positive ([place]) Staging form: Breast, AJCC 8th Edition - Clinical stage from 04/28/2019: Stage IIA (cT2, cN0, cM0, G2, ER+, PR-, HER2: Equivocal) - Signed by Truitt Merle, MD on 05/04/2019 - Pathologic stage from 07/13/2019: Stage IIA (pT2, pN0, cM0, G3, ER+, PR-, HER2+, Oncotype DX score: 56) - Signed by Truitt Merle, MD on 08/20/2019    Malignant neoplasm of upper-outer quadrant of left breast in female, estrogen receptor positive ([facility])  04/21/2019 Mammogram   Diagnostic mammogram 04/21/19  IMPRESSION 1. findings highly suspicious for left breast cancer. There is a palpable irregular mass 4cm from nipple measuring 2.4x2.4x1.8cm in the 1:00 position axis of the left breast and pleomorphic  calcifications in the upper-outer quadrant as described above.  2. single left 0.9x0.5cm axillary lymph node with a mild/borderline thickened cortex.    04/28/2019 Cancer Staging   Staging form: Breast, AJCC 8th Edition - Clinical stage from 04/28/2019: Stage IIA (cT2, cN0, cM0, G2, ER+, PR-, HER2: Equivocal) - Signed by Truitt Merle, MD on 05/04/2019   04/28/2019 Initial Biopsy   Diagnosis 04/28/19 1. Breast, left, needle core biopsy, 1 o'clock position - INVASIVE DUCTAL CARCINOMA. - DUCTAL CARCINOMA IN SITU. - SEE COMMENT. 2. Lymph node,  needle/core biopsy, left axilla - THERE IS NO EVIDENCE OF CARCINOMA IN 1 OF 1 LYMPH NODE (0/1). 3. Breast, left, needle core biopsy, upper, outer quadrant - DUCTAL CARCINOMA IN SITU WITH CALCIFICATIONS. - SEE COMMENT.   04/28/2019 Receptors her2   1. PROGNOSTIC INDICATORS Results: HER2 negative  Estrogen Receptor: 80%, POSITIVE, STRONG STAINING INTENSITY Progesterone Receptor: 0%, NEGATIVE Proliferation Marker Ki67: 35%    04/30/2019 Initial Diagnosis   Malignant neoplasm of upper-outer quadrant of left breast in female, estrogen receptor positive ([name])    Genetic Testing   No pathogenic variants identified. 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 The final report date is 05/28/2019.  The STAT Breast cancer panel offered by [facility] includes sequencing and rearrangement analysis for the following 9 genes:  ATM, BRCA1, BRCA2, CDH1, CHEK2, PALB2, PTEN, STK11 and TP53.    The Common Hereditary Cancers Panel offered by [facility] includes sequencing and/or deletion duplication testing of the following 48 genes: APC, ATM, AXIN2, BARD1, BMPR1A, BRCA1, BRCA2, BRIP1, CDH1, CDKN2A (p14ARF), CDKN2A (p16INK4a), CKD4, CHEK2, CTNNA1, DICER1, EPCAM (Deletion/duplication testing only), GREM1 (promoter region deletion/duplication testing only), KIT, MEN1, MLH1, MSH2, MSH3, MSH6, MUTYH, NBN, NF1, NHTL1, PALB2, PDGFRA, PMS2, POLD1, POLE, PTEN, RAD50, RAD51C, RAD51D, RNF43, SDHB, SDHC, SDHD, SMAD4, SMARCA4. STK11, TP53, TSC1, TSC2, and VHL.  The following genes were evaluated for sequence changes only: SDHA and HOXB13 c.251G>A variant only.   05/11/2019 Breast MRI   IMPRESSION: 1. Enhancing mass and architectural distortion spanning approximately 6 x 3 x 5.3 cm in the upper outer left breast consistent with the patient's biopsy-proven site of invasive cancer. 2. Biopsy changes in  the anterior left breast at the site of the patient's  biopsy-proven ductal carcinoma in situ. 3. Suspicious changes within the upper-outer quadrant of the left breast spanning a total of 8 cm in the AP dimension. This includes the site of biopsy-proven DCIS anteriorly and suspicious enhancement extending to the level of the pectoralis muscle posteriorly. 4. Suspicious 7 mm enhancing mass in the far posterior slightly lateral left breast (image 138/212). 5. No MRI evidence of malignancy on the right. 6. No suspicious lymphadenopathy.     07/13/2019 Surgery   LEFT NIPPLE SPARING MASTECTOMY WITH SENTINEL LYMPH NODE BIOPSY and LEFT BREAST RECONSTRUCTION WITH PLACEMENT OF TISSUE EXPANDER AND ALLODERM by D.r Tsuie and Dr Iran Planas.    07/13/2019 Pathology Results   SURGICAL PATHOLOGY   FINAL MICROSCOPIC DIAGNOSIS:   A. LYMPH NODE, LEFT AXILLARY #1, SENTINEL, BIOPSY:  - One of one lymph nodes negative for carcinoma (0/1).   B. BREAST, LEFT, MASTECTOMY:  - Invasive ductal carcinoma, grade 3, spanning 3.4 cm.  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  - Invasive carcinoma is 0.2 cm from the posterior margin focally (not on  ink) and 0.4 cm from the  anterior margin focally (not on ink).  - In situ carcinoma is 0.4 cm from the posterior margin focally (not on  ink).  - Biopsy site x2.  - See oncology table.   C. NIPPLE, LEFT, BIOPSY:  - Benign nipple tissue.   D. LYMPH NODE, LEFT AXILLARY #2, SENTINEL, BIOPSY:  - One of one lymph nodes negative for carcinoma (0/1).     GROUP 1:  HER2 **POSITIVE**    07/13/2019 Oncotype testing   Ocotype score 56 with 39% risk of recurrence with Tmaoxifen or AI alone. There is a 15% benefit from adjuvant chemo.     07/13/2019 Cancer Staging   Staging form: Breast, AJCC 8th Edition - Pathologic stage from 07/13/2019: Stage IIA (pT2, pN0, cM0, G3, ER+, PR-, HER2+, Oncotype DX score: 56) - Signed by Truitt Merle, MD on 08/20/2019   08/25/2019 Echocardiogram   Baseline Echo  IMPRESSIONS      1. Left ventricular ejection fraction, by estimation, is 65 to 70%. The  left ventricle has normal function. The left ventricle has no regional  wall motion abnormalities. Left ventricular diastolic parameters were  normal.   2. Right ventricular systolic function is normal. The right ventricular  size is normal. There is normal pulmonary artery systolic pressure.   3. Left atrial size was mildly dilated.   4. The mitral valve is normal in structure and function. Trivial mitral  valve regurgitation.   5. The aortic valve is tricuspid. Aortic valve regurgitation is not  visualized.   6. The inferior vena cava is normal in size with greater than 50%  respiratory variability, suggesting right atrial pressure of 3 mmHg.   7. The aortic valve is not well seen. It is trileaflet. There appears to  be some calcification associated with the non-coronary cusp but this is  not well seen.    08/25/2019 Imaging   Whole body Bone Scan  IMPRESSION: No significant abnormality identified.   08/25/2019 Imaging   CT CAP w Contrast  IMPRESSION: 1. No findings of metastatic disease to the chest, abdomen, or pelvis. 2. 0.7 by 0.3 cm sclerotic lesion in the right seventh rib laterally is likely benign but merits surveillance. 3. Mild sclerosis along the sacroiliac joints bilaterally, query mild chronic sacroiliitis.     09/01/2019 Procedure   INSERTION PORT-A-CATH  WITH ULTRASOUND GUIDANCE by Dr. Georgette Dover   09/02/2019 -  Chemotherapy   TCHP (Taxol, Carboplatin, Herceptin, Perjeta) q3weeks for 6 cycles starting 09/02/19, followed by anti-HER2 maintenance therapy with Herceptin q3weeks to complete 1 year of treatment.     Invasive ductal carcinoma of breast, female, left ([place])  07/13/2019 Initial Diagnosis   Invasive ductal carcinoma of breast, female, left ([place])     CURRENT THERAPY: [place] with GCSF q3weeks for 6 cycles starting 09/02/19, followed by maintenance Herceptinto complete one year  therapy  INTERVAL HISTORY: Ms. Bumpus returns for f/u and treatment as scheduled. She completed cycle 2 [place] on 09/23/19 which went better than first cycle. She is still "down" with low energy for 1.5 weeks. Appetite remains stable, she drinks ensure and has to force water. Takes compazine for mild nausea she had once, without vomiting, which is effective. She was constipated for the first week, which resolved with stool softener plus miralax. Her main issue is bone pain from GCSF which is severe for about a week then improves. She has mild pain in her legs today. She remains functional. She takes claritin every other day plus aleve and tylenol. She had less back pain with cycle 2. Overall controllable. She has some dryness to her hands and lips, no rash or mucositis. No fever, chills, cough, chest pain, dyspnea, leg swelling, neuropathy, lacrimation, or concerns with her breasts.    MEDICAL HISTORY:  Past Medical History:  Diagnosis Date  . Cancer ([place])    Left breast Ca  . GERD (gastroesophageal reflux disease)   . Scoliosis   . Sickle cell trait ([place])     SURGICAL HISTORY: Past Surgical History:  Procedure Laterality Date  . BREAST RECONSTRUCTION WITH PLACEMENT OF TISSUE EXPANDER AND ALLODERM Left 07/13/2019   Procedure: LEFT BREAST RECONSTRUCTION WITH PLACEMENT OF TISSUE EXPANDER AND ALLODERM;  Surgeon: Irene Limbo, MD;  Location: [facility];  Service: Plastics;  Laterality: Left;  . DEBRIDEMENT AND CLOSURE WOUND Left 07/30/2019   Procedure: DEBRIDEMENT LEFT MASTECTOMY FLAP;  Surgeon: Irene Limbo, MD;  Location: [facility];  Service: Plastics;  Laterality: Left;  . INDUCED ABORTION  2017  . NIPPLE SPARING MASTECTOMY WITH SENTINEL LYMPH NODE BIOPSY Left 07/13/2019   Procedure: LEFT NIPPLE SPARING MASTECTOMY WITH SENTINEL LYMPH NODE BIOPSY;  Surgeon: Donnie Mesa, MD;  Location: [facility];  Service: General;  Laterality: Left;  . PORTACATH PLACEMENT Right 09/01/2019    Procedure: INSERTION PORT-A-CATH WITH ULTRASOUND GUIDANCE;  Surgeon: Donnie Mesa, MD;  Location: [facility];  Service: General;  Laterality: Right;  . TISSUE EXPANDER PLACEMENT Left 07/30/2019   Procedure: TISSUE EXPANSION LEFT CHEST;  Surgeon: Irene Limbo, MD;  Location: [facility];  Service: Plastics;  Laterality: Left;    I have reviewed the social history and family history with the patient and they are unchanged from previous note.  ALLERGIES:  is allergic to bactrim [sulfamethoxazole-trimethoprim].  MEDICATIONS:  Current Outpatient Medications  Medication Sig Dispense Refill  . dexamethasone (DECADRON) 4 MG tablet     . dexlansoprazole (DEXILANT) 60 MG capsule Take 60 mg by mouth daily.    . Lactobacillus-Inulin ([place] PO) Take 1 capsule by mouth daily as needed (digestive health.).     Marland Kitchen lidocaine-prilocaine (EMLA) cream Apply 1 application topically as needed. 30 g 0  . metroNIDAZOLE (METROGEL) 0.75 % vaginal gel Place 5.40 application vaginally daily.    . Multiple Vitamin (MULTIVITAMIN WITH MINERALS) TABS tablet Take 1 tablet by  mouth 3 (three) times a week. WOMEN'S ONE-A-DAY    . prochlorperazine (COMPAZINE) 10 MG tablet Take 1 tablet (10 mg total) by mouth every 6 (six) hours as needed (Nausea or vomiting). 30 tablet 1  . HYDROcodone-acetaminophen (NORCO/VICODIN) 5-325 MG tablet Take 1 tablet by mouth every 6 (six) hours as needed for moderate pain.    Marland Kitchen ondansetron (ZOFRAN) 8 MG tablet Take 1 tablet (8 mg total) by mouth 2 (two) times daily as needed for refractory nausea / vomiting. Start on day 3 after chemo. 30 tablet 1   No current facility-administered medications for this visit.   Facility-Administered Medications Ordered in Other Visits  Medication Dose Route Frequency Provider Last Rate Last Admin  . CARBOplatin (PARAPLATIN) 630 mg in sodium chloride 0.9 % 250 mL chemo infusion  630 mg Intravenous Once Truitt Merle, MD      . DOCEtaxel (TAXOTERE) 110 mg in sodium chloride 0.9 % 250 mL chemo infusion  75 mg/m2 (Treatment Plan Recorded) Intravenous Once Truitt Merle, MD      . heparin lock flush 100 unit/mL  500 Units Intracatheter Once PRN Truitt Merle, MD      . sodium chloride flush (NS) 0.9 % injection 10 mL  10 mL Intracatheter PRN Truitt Merle, MD      . trastuzumab-dkst (OGIVRI) 357 mg in sodium chloride 0.9 % 250 mL chemo infusion  6 mg/kg (Order-Specific) Intravenous Once Truitt Merle, MD        PHYSICAL EXAMINATION: ECOG PERFORMANCE STATUS: 1 - Symptomatic but completely ambulatory  Vitals:   10/14/19 0915  BP: 125/81  Pulse: 91  Resp: 16  Temp: 97.8 F (36.6 C)  SpO2: 100%   Filed Weights   10/14/19 0915  Weight: 133 lb 6.4 oz (60.5 kg)    GENERAL:alert, no distress and comfortable SKIN: no rash EYES:  sclera clear OROPHARYNX: no thrush or ulcers  LUNGS: clear with normal breathing effort HEART: regular rate & rhythm, no lower extremity edema NEURO: alert & oriented x 3 with fluent speech, normal gait PAC without erythema  Left breast incisions completely healed. Complete breast exam deferred   LABORATORY DATA:  I have reviewed the data as listed CBC Latest Ref Rng & Units 10/14/2019 09/23/2019 09/09/2019  WBC 4.0 - 10.5 K/uL 12.7(H) 10.2 11.1(H)  Hemoglobin 12.0 - 15.0 g/dL 10.7(L) 11.0(L) 14.0  Hematocrit 36.0 - 46.0 % 31.8(L) 32.5(L) 41.2  Platelets 150 - 400 K/uL 249 381 172     CMP Latest Ref Rng & Units 10/14/2019 09/23/2019 09/09/2019  Glucose 70 - 99 mg/dL 121(H) 112(H) 125(H)  BUN 6 - 20 mg/dL 10 13 14   Creatinine 0.44 - 1.00 mg/dL 0.69 0.71 0.77  Sodium 135 - 145 mmol/L 138 137 135  Potassium 3.5 - 5.1 mmol/L 3.8 4.0 3.6  Chloride 98 - 111 mmol/L 105 105 97(L)  CO2 22 - 32 mmol/L 25 24 27   Calcium 8.9 - 10.3 mg/dL 9.0 9.1 9.4  Total Protein 6.5 - 8.1 g/dL 6.6 7.1 7.4  Total Bilirubin 0.3 - 1.2 mg/dL <0.2(L) 0.3 0.3  Alkaline Phos 38 - 126 U/L 83 70 72  AST 15 - 41 U/L  23 26 17   ALT 0 - 44 U/L 44 54(H) 15      RADIOGRAPHIC STUDIES: I have personally reviewed the radiological images as listed and agreed with the findings in the report. No results found.   ASSESSMENT & PLAN: Victoria Hairstonis a 39 y.o.femalewith    1Malignant neoplasm of upper-outer  quadrant of left breast,invasive ductal carcinoma and DCIS, stageIIA,pT2N0M0, ER+/PR-/HER2+, GradeIII -She was diagnosed in 04/2019. She was found to have2 left breast masses with DCIS and 1 mass with Invasive ductal carcinoma, her abnormal lymph node was biopsied and found to be negative. -Her invasive tumor was ER positive, PR negative, HER-2 equivocal by IHC, negative by [facility] initial biopsy -She started Tamoxifen before surgery -On 07/13/19 she underwent left breast mastectomy, pathshowedcomplete surgical resection of her 3.4cmIDCwith clear margins and node negative.Repeat HER2of her surgical sample was equivocal by IHC, and positive by FISH.HER-2 positive breast cancer ismore aggressive andpredictshigh risk feature for cancer recurrence.  - OncotpyeRS(was ordered before repeated HER2 test)of 56 and 39% risk of recurrence with Tamoxifen or AI alone. This is consistent with aggressive nature of HER-2 positive breast cancer. -Given her high risk features of Grade III, HER2 positive disease and RS of 56, Dr. Burr Medico recommendedadjuvantintensivechemotherapy to reduce her high risk of recurrence with TCH (Taxol, Carboplatin, Herceptin) q3weeks for 6 cycles, followed by anti-HER2 maintenance therapy with Herceptin q3weeks to complete 1 year of treatment.Given negative node,there is no benefit of addingPerjetasoit was not recommended,based on the [facility] trail data (NEJM 2017; 377: 122-131). Goal is curative  -given her mastectomy and node negative path, she does not need adjuvant radiation  -She would also benefit from Antiestrogen therapy to reduce risk of distant  recurrence. -Staging work up was negative except 0.7 x0.3 sclerotic lesion in right seventh rib, negative on bone scan. Overall negative for distant metastasis. Baseline ECHO is normal.  -she does not plan to have more children. We reviewed contraceptive measures during intercourse.  -She began adjuvant [place] on 09/02/19  2. Nicotine use, Smoking Cessation  -counseled on smoking cessation  Disposition:  Ms. Ehrsam appears stable. She completed cycle 2 adjuvant TCH with GCSF. She tolerates moderately well with fatigue, constipation, and bone pain lasting 1.5 weeks. She is able to recover well. We reviewed symptom management. Labs adequate to proceed with cycle 3 TCH without dosage adjustments. She will return for GCSF on 4/24 and f/u with cycle 4 in 3 weeks.   All questions were answered. The patient knows to call the clinic with any problems, questions or concerns. No barriers to learning were detected.     Alla Feeling, NP 10/14/19

## 2019-10-14 NOTE — Telephone Encounter (Signed)
Connected with Wende Mott.   "I spoke with Dr. Ernestina Penna nurse last week.  FMLA runs out April 20th but I am still receiving chemotherapy.  Leave claim number remains the same.  FMLA needs to be extended through July.  Return to work date will be July 19th.  I have to work three months before submitting new FMLA request for implant surgery.  Nurse should have received form to get to you or you will receive very soon."  Thanked patient of advance notice of FMLA extension.  Awaiting form from Administrator or nurse.

## 2019-10-16 ENCOUNTER — Other Ambulatory Visit: Payer: Self-pay

## 2019-10-16 ENCOUNTER — Inpatient Hospital Stay: Payer: BC Managed Care – PPO

## 2019-10-16 VITALS — BP 115/77 | HR 96 | Temp 98.7°F | Resp 20

## 2019-10-16 DIAGNOSIS — C50912 Malignant neoplasm of unspecified site of left female breast: Secondary | ICD-10-CM

## 2019-10-16 DIAGNOSIS — Z5111 Encounter for antineoplastic chemotherapy: Secondary | ICD-10-CM | POA: Diagnosis not present

## 2019-10-16 MED ORDER — PEGFILGRASTIM-JMDB 6 MG/0.6ML ~~LOC~~ SOSY
6.0000 mg | PREFILLED_SYRINGE | Freq: Once | SUBCUTANEOUS | Status: AC
  Administered 2019-10-16: 13:00:00 6 mg via SUBCUTANEOUS

## 2019-10-29 NOTE — Progress Notes (Signed)
Pharmacist Chemotherapy Monitoring - Follow Up Assessment    I verify that I have reviewed each item in the below checklist:  . Regimen for the patient is scheduled for the appropriate day and plan matches scheduled date. Marland Kitchen Appropriate non-routine labs are ordered dependent on drug ordered. . If applicable, additional medications reviewed and ordered per protocol based on lifetime cumulative doses and/or treatment regimen.   Plan for follow-up and/or issues identified: No . I-vent associated with next due treatment: No . MD and/or nursing notified: No  Britt Boozer 10/29/2019 11:11 AM

## 2019-11-03 ENCOUNTER — Telehealth: Payer: Self-pay | Admitting: Oncology

## 2019-11-03 NOTE — Progress Notes (Signed)
[place]   [phone] [phone]   Clinic Follow up Note   Patient Care Team: [name], MD as PCP - General (Obstetrics and Gynecology) Mauro Kaufmann, RN as Oncology Nurse Navigator Rockwell Germany, RN as Oncology Nurse Navigator Donnie Mesa, MD as Consulting Physician (General Surgery) Truitt Merle, MD as Consulting Physician (Hematology) Eppie Gibson, MD as Attending Physician (Radiation Oncology)  Date of Service:  11/04/2019  CHIEF COMPLAINT: F/u of left breast cancer  SUMMARY OF ONCOLOGIC HISTORY: Oncology History Overview Note  Cancer Staging Malignant neoplasm of upper-outer quadrant of left breast in female, estrogen receptor positive ([place]) Staging form: Breast, AJCC 8th Edition - Clinical stage from 04/28/2019: Stage IIA (cT2, cN0, cM0, G2, ER+, PR-, HER2: Equivocal) - Signed by Truitt Merle, MD on 05/04/2019 - Pathologic stage from 07/13/2019: Stage IIA (pT2, pN0, cM0, G3, ER+, PR-, HER2+, Oncotype DX score: 43) - Signed by Truitt Merle, MD on 08/20/2019    Malignant neoplasm of upper-outer quadrant of left breast in female, estrogen receptor positive ([place])  04/21/2019 Mammogram   Diagnostic mammogram 04/21/19  IMPRESSION 1. findings highly suspicious for left breast cancer. There is a palpable irregular mass 4cm from nipple measuring 2.4x2.4x1.8cm in the 1:00 position axis of the left breast and pleomorphic  calcifications in the upper-outer quadrant as described above.  2. single left 0.9x0.5cm axillary lymph node with a mild/borderline thickened cortex.    04/28/2019 Cancer Staging   Staging form: Breast, AJCC 8th Edition - Clinical stage from 04/28/2019: Stage IIA (cT2, cN0, cM0, G2, ER+, PR-, HER2: Equivocal) - Signed by Truitt Merle, MD on 05/04/2019   04/28/2019 Initial Biopsy   Diagnosis 04/28/19 1. Breast, left, needle core biopsy, 1 o'clock position - INVASIVE DUCTAL CARCINOMA. - DUCTAL CARCINOMA IN SITU. - SEE  COMMENT. 2. Lymph node, needle/core biopsy, left axilla - THERE IS NO EVIDENCE OF CARCINOMA IN 1 OF 1 LYMPH NODE (0/1). 3. Breast, left, needle core biopsy, upper, outer quadrant - DUCTAL CARCINOMA IN SITU WITH CALCIFICATIONS. - SEE COMMENT.   04/28/2019 Receptors her2   1. PROGNOSTIC INDICATORS Results: HER2 negative  Estrogen Receptor: 80%, POSITIVE, STRONG STAINING INTENSITY Progesterone Receptor: 0%, NEGATIVE Proliferation Marker Ki67: 35%    04/30/2019 Initial Diagnosis   Malignant neoplasm of upper-outer quadrant of left breast in female, estrogen receptor positive ([name])    Genetic Testing   No pathogenic variants identified. 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 The final report date is 05/28/2019.  The STAT Breast cancer panel offered by [facility] includes sequencing and rearrangement analysis for the following 9 genes:  ATM, BRCA1, BRCA2, CDH1, CHEK2, PALB2, PTEN, STK11 and TP53.    The Common Hereditary Cancers Panel offered by [facility] includes sequencing and/or deletion duplication testing of the following 48 genes: APC, ATM, AXIN2, BARD1, BMPR1A, BRCA1, BRCA2, BRIP1, CDH1, CDKN2A (p14ARF), CDKN2A (p16INK4a), CKD4, CHEK2, CTNNA1, DICER1, EPCAM (Deletion/duplication testing only), GREM1 (promoter region deletion/duplication testing only), KIT, MEN1, MLH1, MSH2, MSH3, MSH6, MUTYH, NBN, NF1, NHTL1, PALB2, PDGFRA, PMS2, POLD1, POLE, PTEN, RAD50, RAD51C, RAD51D, RNF43, SDHB, SDHC, SDHD, SMAD4, SMARCA4. STK11, TP53, TSC1, TSC2, and VHL.  The following genes were evaluated for sequence changes only: SDHA and HOXB13 c.251G>A variant only.   05/11/2019 Breast MRI   IMPRESSION: 1. Enhancing mass and architectural distortion spanning approximately 6 x 3 x 5.3 cm in the upper outer left breast consistent with the patient's biopsy-proven site of invasive  cancer. 2. Biopsy changes in the anterior left breast at the  site of the patient's biopsy-proven ductal carcinoma in situ. 3. Suspicious changes within the upper-outer quadrant of the left breast spanning a total of 8 cm in the AP dimension. This includes the site of biopsy-proven DCIS anteriorly and suspicious enhancement extending to the level of the pectoralis muscle posteriorly. 4. Suspicious 7 mm enhancing mass in the far posterior slightly lateral left breast (image 138/212). 5. No MRI evidence of malignancy on the right. 6. No suspicious lymphadenopathy.     07/13/2019 Surgery   LEFT NIPPLE SPARING MASTECTOMY WITH SENTINEL LYMPH NODE BIOPSY and LEFT BREAST RECONSTRUCTION WITH PLACEMENT OF TISSUE EXPANDER AND ALLODERM by D.r Tsuie and Dr Iran Planas.    07/13/2019 Pathology Results   SURGICAL PATHOLOGY   FINAL MICROSCOPIC DIAGNOSIS:   A. LYMPH NODE, LEFT AXILLARY #1, SENTINEL, BIOPSY:  - One of one lymph nodes negative for carcinoma (0/1).   B. BREAST, LEFT, MASTECTOMY:  - Invasive ductal carcinoma, grade 3, spanning 3.4 cm.  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  - Invasive carcinoma is 0.2 cm from the posterior margin focally (not on  ink) and 0.4 cm from the  anterior margin focally (not on ink).  - In situ carcinoma is 0.4 cm from the posterior margin focally (not on  ink).  - Biopsy site x2.  - See oncology table.   C. NIPPLE, LEFT, BIOPSY:  - Benign nipple tissue.   D. LYMPH NODE, LEFT AXILLARY #2, SENTINEL, BIOPSY:  - One of one lymph nodes negative for carcinoma (0/1).     GROUP 1:  HER2 **POSITIVE**    07/13/2019 Oncotype testing   Ocotype score 56 with 39% risk of recurrence with Tmaoxifen or AI alone. There is a 15% benefit from adjuvant chemo.     07/13/2019 Cancer Staging   Staging form: Breast, AJCC 8th Edition - Pathologic stage from 07/13/2019: Stage IIA (pT2, pN0, cM0, G3, ER+, PR-, HER2+, Oncotype DX score: 56) - Signed by Truitt Merle, MD on 08/20/2019   08/25/2019 Echocardiogram   Baseline Echo   IMPRESSIONS     1. Left ventricular ejection fraction, by estimation, is 65 to 70%. The  left ventricle has normal function. The left ventricle has no regional  wall motion abnormalities. Left ventricular diastolic parameters were  normal.   2. Right ventricular systolic function is normal. The right ventricular  size is normal. There is normal pulmonary artery systolic pressure.   3. Left atrial size was mildly dilated.   4. The mitral valve is normal in structure and function. Trivial mitral  valve regurgitation.   5. The aortic valve is tricuspid. Aortic valve regurgitation is not  visualized.   6. The inferior vena cava is normal in size with greater than 50%  respiratory variability, suggesting right atrial pressure of 3 mmHg.   7. The aortic valve is not well seen. It is trileaflet. There appears to  be some calcification associated with the non-coronary cusp but this is  not well seen.    08/25/2019 Imaging   Whole body Bone Scan  IMPRESSION: No significant abnormality identified.   08/25/2019 Imaging   CT CAP w Contrast  IMPRESSION: 1. No findings of metastatic disease to the chest, abdomen, or pelvis. 2. 0.7 by 0.3 cm sclerotic lesion in the right seventh rib laterally is likely benign but merits surveillance. 3. Mild sclerosis along the sacroiliac joints bilaterally, query mild chronic sacroiliitis.     09/01/2019  Procedure   INSERTION PORT-A-CATH WITH ULTRASOUND GUIDANCE by Dr. Georgette Dover   09/02/2019 -  Chemotherapy   TCH (Taxol, Carboplatin, Herceptin) q3weeks for 6 cycles starting 09/02/19, followed by anti-HER2 maintenance therapy with Herceptin q3weeks to complete 1 year of treatment.     Invasive ductal carcinoma of breast, female, left ([place])  07/13/2019 Initial Diagnosis   Invasive ductal carcinoma of breast, female, left ([place])      CURRENT THERAPY:  [place] q3weeks for 6 cycles starting 09/02/19, followed by maintenance Herceptinto complete one year  therapy  INTERVAL HISTORY:  Keyoni Lapinski is here for a follow up and treatment. She presents to the clinic alone. She notes having b/l leg pain and nausea with C3 chemo. She notes her leg pain is related to her GCSF injection. She notes her energy was better this cycle. She notes with Dignicap she had hair thinning on her scalp. She notes she has not had a period recently.     REVIEW OF SYSTEMS:   Constitutional: Denies fevers, chills or abnormal weight loss Eyes: Denies blurriness of vision Ears, nose, mouth, throat, and face: Denies mucositis or sore throat Respiratory: Denies cough, dyspnea or wheezes Cardiovascular: Denies palpitation, chest discomfort or lower extremity swelling Gastrointestinal:  Denies nausea, heartburn or change in bowel habits Skin: Denies abnormal skin rashes (+) Hair thinning  Lymphatics: Denies new lymphadenopathy or easy bruising Neurological:Denies numbness, tingling or new weaknesses Behavioral/Psych: Mood is stable, no new changes  All other systems were reviewed with the patient and are negative.  MEDICAL HISTORY:  Past Medical History:  Diagnosis Date  . Cancer ([place])    Left breast Ca  . GERD (gastroesophageal reflux disease)   . Scoliosis   . Sickle cell trait ([place])     SURGICAL HISTORY: Past Surgical History:  Procedure Laterality Date  . BREAST RECONSTRUCTION WITH PLACEMENT OF TISSUE EXPANDER AND ALLODERM Left 07/13/2019   Procedure: LEFT BREAST RECONSTRUCTION WITH PLACEMENT OF TISSUE EXPANDER AND ALLODERM;  Surgeon: Irene Limbo, MD;  Location: [place];  Service: Plastics;  Laterality: Left;  . DEBRIDEMENT AND CLOSURE WOUND Left 07/30/2019   Procedure: DEBRIDEMENT LEFT MASTECTOMY FLAP;  Surgeon: Irene Limbo, MD;  Location: [place];  Service: Plastics;  Laterality: Left;  . INDUCED ABORTION  2017  . NIPPLE SPARING MASTECTOMY WITH SENTINEL LYMPH NODE BIOPSY Left 07/13/2019   Procedure: LEFT NIPPLE SPARING  MASTECTOMY WITH SENTINEL LYMPH NODE BIOPSY;  Surgeon: Donnie Mesa, MD;  Location: [place];  Service: General;  Laterality: Left;  . PORTACATH PLACEMENT Right 09/01/2019   Procedure: INSERTION PORT-A-CATH WITH ULTRASOUND GUIDANCE;  Surgeon: Donnie Mesa, MD;  Location: [place];  Service: General;  Laterality: Right;  . TISSUE EXPANDER PLACEMENT Left 07/30/2019   Procedure: TISSUE EXPANSION LEFT CHEST;  Surgeon: Irene Limbo, MD;  Location: [place];  Service: Plastics;  Laterality: Left;    I have reviewed the social history and family history with the patient and they are unchanged from previous note.  ALLERGIES:  is allergic to bactrim [sulfamethoxazole-trimethoprim].  MEDICATIONS:  Current Outpatient Medications  Medication Sig Dispense Refill  . dexamethasone (DECADRON) 4 MG tablet     . dexlansoprazole (DEXILANT) 60 MG capsule Take 60 mg by mouth daily.    Marland Kitchen HYDROcodone-acetaminophen (NORCO/VICODIN) 5-325 MG tablet Take 1 tablet by mouth every 6 (six) hours as needed for moderate pain.    . Lactobacillus-Inulin ([place] PO) Take 1 capsule by mouth daily as needed (digestive health.).     Marland Kitchen  lidocaine-prilocaine (EMLA) cream Apply 1 application topically as needed. 30 g 0  . metroNIDAZOLE (METROGEL) 0.75 % vaginal gel Place 9.21 application vaginally daily.    . Multiple Vitamin (MULTIVITAMIN WITH MINERALS) TABS tablet Take 1 tablet by mouth 3 (three) times a week. WOMEN'S ONE-A-DAY    . ondansetron (ZOFRAN) 8 MG tablet Take 1 tablet (8 mg total) by mouth 2 (two) times daily as needed for refractory nausea / vomiting. Start on day 3 after chemo. 30 tablet 1  . prochlorperazine (COMPAZINE) 10 MG tablet Take 1 tablet (10 mg total) by mouth every 6 (six) hours as needed (Nausea or vomiting). 30 tablet 1   No current facility-administered medications for this visit.    PHYSICAL EXAMINATION: ECOG PERFORMANCE STATUS: 1 - Symptomatic  but completely ambulatory  Vitals:   11/04/19 0935  BP: 130/72  Pulse: 93  Resp: 18  Temp: 97.8 F (36.6 C)  SpO2: 99%   Filed Weights   11/04/19 0935  Weight: 137 lb 9.6 oz (62.4 kg)    GENERAL:alert, no distress and comfortable SKIN: skin color, texture, turgor are normal, no rashes or significant lesions EYES: normal, Conjunctiva are pink and non-injected, sclera clear  NECK: supple, thyroid normal size, non-tender, without nodularity LYMPH:  no palpable lymphadenopathy in the cervical, axillary  LUNGS: clear to auscultation and percussion with normal breathing effort HEART: regular rate & rhythm and no murmurs and no lower extremity edema ABDOMEN:abdomen soft, non-tender and normal bowel sounds Musculoskeletal:no cyanosis of digits and no clubbing  NEURO: alert & oriented x 3 with fluent speech, no focal motor/sensory deficits BREAST: S/p left mastectomy and reconstruction with tissue expander: Surgical incision healed well. No palpable mass, nodules or adenopathy bilaterally. Breast exam benign.   LABORATORY DATA:  I have reviewed the data as listed CBC Latest Ref Rng & Units 11/04/2019 10/14/2019 09/23/2019  WBC 4.0 - 10.5 K/uL 7.9 12.7(H) 10.2  Hemoglobin 12.0 - 15.0 g/dL 10.5(L) 10.7(L) 11.0(L)  Hematocrit 36.0 - 46.0 % 31.7(L) 31.8(L) 32.5(L)  Platelets 150 - 400 K/uL 256 249 381     CMP Latest Ref Rng & Units 11/04/2019 10/14/2019 09/23/2019  Glucose 70 - 99 mg/dL 112(H) 121(H) 112(H)  BUN 6 - 20 mg/dL 12 10 13   Creatinine 0.44 - 1.00 mg/dL 0.70 0.69 0.71  Sodium 135 - 145 mmol/L 139 138 137  Potassium 3.5 - 5.1 mmol/L 4.1 3.8 4.0  Chloride 98 - 111 mmol/L 106 105 105  CO2 22 - 32 mmol/L 24 25 24   Calcium 8.9 - 10.3 mg/dL 8.6(L) 9.0 9.1  Total Protein 6.5 - 8.1 g/dL 6.0(L) 6.6 7.1  Total Bilirubin 0.3 - 1.2 mg/dL <0.2(L) <0.2(L) 0.3  Alkaline Phos 38 - 126 U/L 78 83 70  AST 15 - 41 U/L 19 23 26   ALT 0 - 44 U/L 27 44 54(H)      RADIOGRAPHIC STUDIES: I have  personally reviewed the radiological images as listed and agreed with the findings in the report. No results found.   ASSESSMENT & PLAN:  Victoria Barker is a 39 y.o. female with    1.Malignant neoplasm of upper-outer quadrant of left breast,invasive ductal carcinoma and DCIS, stageIIA,pT2N0M0, ER+/PR-/HER2+, GradeIII -She was diagnosed in 04/2019. She was found to have2 left breast masses with DCIS and 1 mass with Invasive ductal carcinoma, her abnormal lymph node was biopsied and found to be negative. -Her invasive tumor was ER positive, PR negative, HER-2 equivocal by IHC, negative by [facility] initial biopsy -On 07/13/19  she underwent left breast mastectomy with Dr. Prince Solian and reconstruction with Dr. Iran Planas. We discussed her path which showedcomplete surgical resection of her 3.4cmIDCwith clear margins and node negative. We discussed HER-2 positive breast cancer ismore aggressive andpredictshigh risk feature for cancer recurrence.  -She was found to be HER2 positive on resected tumor and Oncotype testing (ordered before HER2 came back positive)with recurrence score of 56.This is consistent with aggressive nature of HER-2 positive breast cancer. -To reduce her high risk of recurrence I started her on adjuvant chemotherapy with [facility] q3weeks for 6 cycles starting 09/02/19, followed by maintenance Herceptinto complete one year therapy. Given negative node,there is no benefit of addingPerjetaso I do not recommend, based on the APHINITY trail data (NEJM 2017; 377: 122-131) -She would also benefit from Antiestrogen therapy to reduce risk of distant recurrence. Given she is s/p mastectomy and node negative she does not need adjuvant RT. After chemo she can proceed with Breast reconstruction.  -S/p C3 she tolerated moderately well with mild nausea and leg pain after GCSF injection. She continues to see benefit from [place], will continue.  -Labs reviewed, and overall adequate to proceed  with C4 TCH today.  -Given her work schedule and her living in [place], she rather have Herceptin injections for maintenance therapy. I will check this with her insurance.  -She is due for ECHO in June  -F/u in 3 weeks    2. Nicotine use, Smoking Cessation  -She smoked cigarettes for 2 years, less than 1/2ppd. She currently smokes jewel pod containing nicotine for the past 3 years.  -Smoking cessationhas been reviewedwith her. She is willing to quit.   3. Acid Reflux -Well controlled on Dexilant  4. Constipation  -She will continues daily prophylactic stool softener    PLAN: -Labs reviewed and adequate to proceed with C4 TCH today at full dose with GCSF tomorrow  -Lab, flush, f/u and Chemo TCHin 3, 6 weeks, with GCSF on day 2.  -Echo and GCSF after cycle C5 on 6/7   No problem-specific Assessment & Plan notes found for this encounter.   No orders of the defined types were placed in this encounter.  All questions were answered. The patient knows to call the clinic with any problems, questions or concerns. No barriers to learning was detected. The total time spent in the appointment was 30 minutes.     Truitt Merle, MD 11/04/2019   I, Joslyn Devon, am acting as scribe for Truitt Merle, MD.   I have reviewed the above documentation for accuracy and completeness, and I agree with the above.

## 2019-11-04 ENCOUNTER — Inpatient Hospital Stay: Payer: BC Managed Care – PPO

## 2019-11-04 ENCOUNTER — Encounter: Payer: Self-pay | Admitting: *Deleted

## 2019-11-04 ENCOUNTER — Other Ambulatory Visit: Payer: Self-pay

## 2019-11-04 ENCOUNTER — Inpatient Hospital Stay: Payer: BC Managed Care – PPO | Attending: Hematology

## 2019-11-04 ENCOUNTER — Encounter: Payer: Self-pay | Admitting: Hematology

## 2019-11-04 ENCOUNTER — Inpatient Hospital Stay (HOSPITAL_BASED_OUTPATIENT_CLINIC_OR_DEPARTMENT_OTHER): Payer: BC Managed Care – PPO | Admitting: Hematology

## 2019-11-04 VITALS — BP 130/72 | HR 93 | Temp 97.8°F | Resp 18 | Ht 62.0 in | Wt 137.6 lb

## 2019-11-04 DIAGNOSIS — C50412 Malignant neoplasm of upper-outer quadrant of left female breast: Secondary | ICD-10-CM | POA: Insufficient documentation

## 2019-11-04 DIAGNOSIS — Z95828 Presence of other vascular implants and grafts: Secondary | ICD-10-CM

## 2019-11-04 DIAGNOSIS — Z5189 Encounter for other specified aftercare: Secondary | ICD-10-CM | POA: Insufficient documentation

## 2019-11-04 DIAGNOSIS — C50912 Malignant neoplasm of unspecified site of left female breast: Secondary | ICD-10-CM

## 2019-11-04 DIAGNOSIS — Z5111 Encounter for antineoplastic chemotherapy: Secondary | ICD-10-CM | POA: Diagnosis present

## 2019-11-04 DIAGNOSIS — Z5112 Encounter for antineoplastic immunotherapy: Secondary | ICD-10-CM | POA: Insufficient documentation

## 2019-11-04 DIAGNOSIS — Z170 Estrogen receptor positive status [ER+]: Secondary | ICD-10-CM

## 2019-11-04 LAB — CMP (CANCER CENTER ONLY)
ALT: 27 U/L (ref 0–44)
AST: 19 U/L (ref 15–41)
Albumin: 3.2 g/dL — ABNORMAL LOW (ref 3.5–5.0)
Alkaline Phosphatase: 78 U/L (ref 38–126)
Anion gap: 9 (ref 5–15)
BUN: 12 mg/dL (ref 6–20)
CO2: 24 mmol/L (ref 22–32)
Calcium: 8.6 mg/dL — ABNORMAL LOW (ref 8.9–10.3)
Chloride: 106 mmol/L (ref 98–111)
Creatinine: 0.7 mg/dL (ref 0.44–1.00)
GFR, Est AFR Am: >60 mL/min (ref >60)
GFR, Estimated: >60 mL/min (ref >60)
Glucose, Bld: 112 mg/dL — ABNORMAL HIGH (ref 70–99)
Potassium: 4.1 mmol/L (ref 3.5–5.1)
Sodium: 139 mmol/L (ref 135–145)
Total Bilirubin: <0.2 mg/dL — ABNORMAL LOW (ref 0.3–1.2)
Total Protein: 6 g/dL — ABNORMAL LOW (ref 6.5–8.1)

## 2019-11-04 LAB — CBC WITH DIFFERENTIAL (CANCER CENTER ONLY)
Abs Immature Granulocytes: 0.04 10*3/uL (ref 0.00–0.07)
Basophils Absolute: 0 10*3/uL (ref 0.0–0.1)
Basophils Relative: 0 %
Eosinophils Absolute: 0 10*3/uL (ref 0.0–0.5)
Eosinophils Relative: 0 %
HCT: 31.7 % — ABNORMAL LOW (ref 36.0–46.0)
Hemoglobin: 10.5 g/dL — ABNORMAL LOW (ref 12.0–15.0)
Immature Granulocytes: 1 %
Lymphocytes Relative: 12 %
Lymphs Abs: 1 10*3/uL (ref 0.7–4.0)
MCH: 31.2 pg (ref 26.0–34.0)
MCHC: 33.1 g/dL (ref 30.0–36.0)
MCV: 94.1 fL (ref 80.0–100.0)
Monocytes Absolute: 0.7 10*3/uL (ref 0.1–1.0)
Monocytes Relative: 9 %
Neutro Abs: 6.2 10*3/uL (ref 1.7–7.7)
Neutrophils Relative %: 78 %
Platelet Count: 256 10*3/uL (ref 150–400)
RBC: 3.37 MIL/uL — ABNORMAL LOW (ref 3.87–5.11)
RDW: 16.1 % — ABNORMAL HIGH (ref 11.5–15.5)
WBC Count: 7.9 10*3/uL (ref 4.0–10.5)
nRBC: 0 % (ref 0.0–0.2)

## 2019-11-04 LAB — PREGNANCY, URINE: Preg Test, Ur: NEGATIVE

## 2019-11-04 MED ORDER — SODIUM CHLORIDE 0.9 % IV SOLN
629.4000 mg | Freq: Once | INTRAVENOUS | Status: AC
  Administered 2019-11-04: 629.4 mg via INTRAVENOUS
  Filled 2019-11-04: qty 62.94

## 2019-11-04 MED ORDER — PROCHLORPERAZINE MALEATE 10 MG PO TABS
ORAL_TABLET | ORAL | Status: AC
  Filled 2019-11-04: qty 1

## 2019-11-04 MED ORDER — SODIUM CHLORIDE 0.9 % IV SOLN
75.0000 mg/m2 | Freq: Once | INTRAVENOUS | Status: AC
  Administered 2019-11-04: 110 mg via INTRAVENOUS
  Filled 2019-11-04: qty 11

## 2019-11-04 MED ORDER — DIPHENHYDRAMINE HCL 25 MG PO CAPS
ORAL_CAPSULE | ORAL | Status: AC
  Filled 2019-11-04: qty 1

## 2019-11-04 MED ORDER — ACETAMINOPHEN 325 MG PO TABS
650.0000 mg | ORAL_TABLET | Freq: Once | ORAL | Status: AC
  Administered 2019-11-04: 650 mg via ORAL

## 2019-11-04 MED ORDER — PROCHLORPERAZINE MALEATE 10 MG PO TABS
10.0000 mg | ORAL_TABLET | Freq: Once | ORAL | Status: AC
  Administered 2019-11-04: 10 mg via ORAL

## 2019-11-04 MED ORDER — SODIUM CHLORIDE 0.9 % IV SOLN
Freq: Once | INTRAVENOUS | Status: AC
  Filled 2019-11-04: qty 250

## 2019-11-04 MED ORDER — PALONOSETRON HCL INJECTION 0.25 MG/5ML
0.2500 mg | Freq: Once | INTRAVENOUS | Status: AC
  Administered 2019-11-04: 0.25 mg via INTRAVENOUS

## 2019-11-04 MED ORDER — TRASTUZUMAB-DKST CHEMO 150 MG IV SOLR
6.0000 mg/kg | Freq: Once | INTRAVENOUS | Status: AC
  Administered 2019-11-04: 357 mg via INTRAVENOUS
  Filled 2019-11-04: qty 17

## 2019-11-04 MED ORDER — SODIUM CHLORIDE 0.9% FLUSH
10.0000 mL | Freq: Once | INTRAVENOUS | Status: AC
  Administered 2019-11-04: 10 mL via INTRAVENOUS
  Filled 2019-11-04: qty 10

## 2019-11-04 MED ORDER — DIPHENHYDRAMINE HCL 25 MG PO CAPS
25.0000 mg | ORAL_CAPSULE | Freq: Once | ORAL | Status: AC
  Administered 2019-11-04: 25 mg via ORAL

## 2019-11-04 MED ORDER — PALONOSETRON HCL INJECTION 0.25 MG/5ML
INTRAVENOUS | Status: AC
  Filled 2019-11-04: qty 5

## 2019-11-04 MED ORDER — SODIUM CHLORIDE 0.9% FLUSH
10.0000 mL | INTRAVENOUS | Status: DC | PRN
  Administered 2019-11-04: 10 mL
  Filled 2019-11-04: qty 10

## 2019-11-04 MED ORDER — SODIUM CHLORIDE 0.9 % IV SOLN
10.0000 mg | Freq: Once | INTRAVENOUS | Status: AC
  Administered 2019-11-04: 10 mg via INTRAVENOUS
  Filled 2019-11-04: qty 10

## 2019-11-04 MED ORDER — HEPARIN SOD (PORK) LOCK FLUSH 100 UNIT/ML IV SOLN
500.0000 [IU] | Freq: Once | INTRAVENOUS | Status: AC | PRN
  Administered 2019-11-04: 500 [IU]
  Filled 2019-11-04: qty 5

## 2019-11-04 MED ORDER — SODIUM CHLORIDE 0.9 % IV SOLN
150.0000 mg | Freq: Once | INTRAVENOUS | Status: AC
  Administered 2019-11-04: 150 mg via INTRAVENOUS
  Filled 2019-11-04: qty 150

## 2019-11-04 MED ORDER — ACETAMINOPHEN 325 MG PO TABS
ORAL_TABLET | ORAL | Status: AC
  Filled 2019-11-04: qty 2

## 2019-11-04 NOTE — Patient Instructions (Signed)
[facility] Discharge Instructions for Patients Receiving Chemotherapy  Today you received the following chemotherapy agents: trastuzumab, docetaxel, and carboplatin.  To help prevent nausea and vomiting after your treatment, we encourage you to take your nausea medication as directed.   If you develop nausea and vomiting that is not controlled by your nausea medication, call the clinic.   BELOW ARE SYMPTOMS THAT SHOULD BE REPORTED IMMEDIATELY:  *FEVER GREATER THAN 100.5 F  *CHILLS WITH OR WITHOUT FEVER  NAUSEA AND VOMITING THAT IS NOT CONTROLLED WITH YOUR NAUSEA MEDICATION  *UNUSUAL SHORTNESS OF BREATH  *UNUSUAL BRUISING OR BLEEDING  TENDERNESS IN MOUTH AND THROAT WITH OR WITHOUT PRESENCE OF ULCERS  *URINARY PROBLEMS  *BOWEL PROBLEMS  UNUSUAL RASH Items with * indicate a potential emergency and should be followed up as soon as possible.  Feel free to call the clinic should you have any questions or concerns. The clinic phone number is [phone].  Please show the CHEMO ALERT CARD at check-in to the Emergency Department and triage nurse.   

## 2019-11-04 NOTE — Progress Notes (Signed)
Patient with 2 kg weight increase since last treatment. Per Dr. Burr Medico patient will continue at same doses as treated with last time (10/14/19) for today's treatment. Treatment plan will not be updated at this time to reflect weight increase.   Leron Croak, PharmD, BCPS PGY2 Hematology/Oncology Pharmacy Resident 11/04/2019 10:23 AM

## 2019-11-06 ENCOUNTER — Inpatient Hospital Stay: Payer: BC Managed Care – PPO

## 2019-11-06 ENCOUNTER — Other Ambulatory Visit: Payer: Self-pay

## 2019-11-06 VITALS — BP 105/78 | HR 91 | Temp 98.7°F | Resp 20

## 2019-11-06 DIAGNOSIS — C50912 Malignant neoplasm of unspecified site of left female breast: Secondary | ICD-10-CM

## 2019-11-06 DIAGNOSIS — Z5111 Encounter for antineoplastic chemotherapy: Secondary | ICD-10-CM | POA: Diagnosis not present

## 2019-11-06 MED ORDER — PEGFILGRASTIM-JMDB 6 MG/0.6ML ~~LOC~~ SOSY
6.0000 mg | PREFILLED_SYRINGE | Freq: Once | SUBCUTANEOUS | Status: AC
  Administered 2019-11-06: 6 mg via SUBCUTANEOUS

## 2019-11-19 NOTE — Progress Notes (Signed)
Pharmacist Chemotherapy Monitoring - Follow Up Assessment    I verify that I have reviewed each item in the below checklist:  . Regimen for the patient is scheduled for the appropriate day and plan matches scheduled date. Marland Kitchen Appropriate non-routine labs are ordered dependent on drug ordered. . If applicable, additional medications reviewed and ordered per protocol based on lifetime cumulative doses and/or treatment regimen.   Plan for follow-up and/or issues identified: No . I-vent associated with next due treatment: No . MD and/or nursing notified: No  Nicco Reaume D 11/19/2019 4:26 PM n n

## 2019-11-19 NOTE — Progress Notes (Signed)
[place]   Telephone:([phone] Fax:([phone]) [phone]   Clinic Follow up Note   Patient Care Team: [name], MD as PCP - General (Obstetrics and Gynecology) Mauro Kaufmann, RN as Oncology Nurse Navigator Rockwell Germany, RN as Oncology Nurse Navigator Donnie Mesa, MD as Consulting Physician (General Surgery) Truitt Merle, MD as Consulting Physician (Hematology) Eppie Gibson, MD as Attending Physician (Radiation Oncology)  Date of Service:  11/25/2019  CHIEF COMPLAINT: F/u of left breast cancer  SUMMARY OF ONCOLOGIC HISTORY: Oncology History Overview Note  Cancer Staging Malignant neoplasm of upper-outer quadrant of left breast in female, estrogen receptor positive ([place]) Staging form: Breast, AJCC 8th Edition - Clinical stage from 04/28/2019: Stage IIA (cT2, cN0, cM0, G2, ER+, PR-, HER2: Equivocal) - Signed by Truitt Merle, MD on 05/04/2019 - Pathologic stage from 07/13/2019: Stage IIA (pT2, pN0, cM0, G3, ER+, PR-, HER2+, Oncotype DX score: 64) - Signed by Truitt Merle, MD on 08/20/2019    Malignant neoplasm of upper-outer quadrant of left breast in female, estrogen receptor positive ([place])  04/21/2019 Mammogram   Diagnostic mammogram 04/21/19  IMPRESSION 1. findings highly suspicious for left breast cancer. There is a palpable irregular mass 4cm from nipple measuring 2.4x2.4x1.8cm in the 1:00 position axis of the left breast and pleomorphic  calcifications in the upper-outer quadrant as described above.  2. single left 0.9x0.5cm axillary lymph node with a mild/borderline thickened cortex.    04/28/2019 Cancer Staging   Staging form: Breast, AJCC 8th Edition - Clinical stage from 04/28/2019: Stage IIA (cT2, cN0, cM0, G2, ER+, PR-, HER2: Equivocal) - Signed by Truitt Merle, MD on 05/04/2019   04/28/2019 Initial Biopsy   Diagnosis 04/28/19 1. Breast, left, needle core biopsy, 1 o'clock position - INVASIVE DUCTAL CARCINOMA. - DUCTAL CARCINOMA IN SITU. - SEE  COMMENT. 2. Lymph node, needle/core biopsy, left axilla - THERE IS NO EVIDENCE OF CARCINOMA IN 1 OF 1 LYMPH NODE (0/1). 3. Breast, left, needle core biopsy, upper, outer quadrant - DUCTAL CARCINOMA IN SITU WITH CALCIFICATIONS. - SEE COMMENT.   04/28/2019 Receptors her2   1. PROGNOSTIC INDICATORS Results: HER2 negative  Estrogen Receptor: 80%, POSITIVE, STRONG STAINING INTENSITY Progesterone Receptor: 0%, NEGATIVE Proliferation Marker Ki67: 35%    04/30/2019 Initial Diagnosis   Malignant neoplasm of upper-outer quadrant of left breast in female, estrogen receptor positive ([name])    Genetic Testing   No pathogenic variants identified. 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 The final report date is 05/28/2019.  The STAT Breast cancer panel offered by [facility] includes sequencing and rearrangement analysis for the following 9 genes:  ATM, BRCA1, BRCA2, CDH1, CHEK2, PALB2, PTEN, STK11 and TP53.    The Common Hereditary Cancers Panel offered by [facility] includes sequencing and/or deletion duplication testing of the following 48 genes: APC, ATM, AXIN2, BARD1, BMPR1A, BRCA1, BRCA2, BRIP1, CDH1, CDKN2A (p14ARF), CDKN2A (p16INK4a), CKD4, CHEK2, CTNNA1, DICER1, EPCAM (Deletion/duplication testing only), GREM1 (promoter region deletion/duplication testing only), KIT, MEN1, MLH1, MSH2, MSH3, MSH6, MUTYH, NBN, NF1, NHTL1, PALB2, PDGFRA, PMS2, POLD1, POLE, PTEN, RAD50, RAD51C, RAD51D, RNF43, SDHB, SDHC, SDHD, SMAD4, SMARCA4. STK11, TP53, TSC1, TSC2, and VHL.  The following genes were evaluated for sequence changes only: SDHA and HOXB13 c.251G>A variant only.   05/11/2019 Breast MRI   IMPRESSION: 1. Enhancing mass and architectural distortion spanning approximately 6 x 3 x 5.3 cm in the upper outer left breast consistent with the patient's biopsy-proven site of invasive  cancer. 2. Biopsy changes in the anterior left breast at the  site of the patient's biopsy-proven ductal carcinoma in situ. 3. Suspicious changes within the upper-outer quadrant of the left breast spanning a total of 8 cm in the AP dimension. This includes the site of biopsy-proven DCIS anteriorly and suspicious enhancement extending to the level of the pectoralis muscle posteriorly. 4. Suspicious 7 mm enhancing mass in the far posterior slightly lateral left breast (image 138/212). 5. No MRI evidence of malignancy on the right. 6. No suspicious lymphadenopathy.     07/13/2019 Surgery   LEFT NIPPLE SPARING MASTECTOMY WITH SENTINEL LYMPH NODE BIOPSY and LEFT BREAST RECONSTRUCTION WITH PLACEMENT OF TISSUE EXPANDER AND ALLODERM by D.r Tsuie and Dr Iran Planas.    07/13/2019 Pathology Results   SURGICAL PATHOLOGY   FINAL MICROSCOPIC DIAGNOSIS:   A. LYMPH NODE, LEFT AXILLARY #1, SENTINEL, BIOPSY:  - One of one lymph nodes negative for carcinoma (0/1).   B. BREAST, LEFT, MASTECTOMY:  - Invasive ductal carcinoma, grade 3, spanning 3.4 cm.  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  - Invasive carcinoma is 0.2 cm from the posterior margin focally (not on  ink) and 0.4 cm from the  anterior margin focally (not on ink).  - In situ carcinoma is 0.4 cm from the posterior margin focally (not on  ink).  - Biopsy site x2.  - See oncology table.   C. NIPPLE, LEFT, BIOPSY:  - Benign nipple tissue.   D. LYMPH NODE, LEFT AXILLARY #2, SENTINEL, BIOPSY:  - One of one lymph nodes negative for carcinoma (0/1).     GROUP 1:  HER2 **POSITIVE**    07/13/2019 Oncotype testing   Ocotype score 56 with 39% risk of recurrence with Tmaoxifen or AI alone. There is a 15% benefit from adjuvant chemo.     07/13/2019 Cancer Staging   Staging form: Breast, AJCC 8th Edition - Pathologic stage from 07/13/2019: Stage IIA (pT2, pN0, cM0, G3, ER+, PR-, HER2+, Oncotype DX score: 56) - Signed by Truitt Merle, MD on 08/20/2019   08/25/2019 Echocardiogram   Baseline Echo   IMPRESSIONS     1. Left ventricular ejection fraction, by estimation, is 65 to 70%. The  left ventricle has normal function. The left ventricle has no regional  wall motion abnormalities. Left ventricular diastolic parameters were  normal.   2. Right ventricular systolic function is normal. The right ventricular  size is normal. There is normal pulmonary artery systolic pressure.   3. Left atrial size was mildly dilated.   4. The mitral valve is normal in structure and function. Trivial mitral  valve regurgitation.   5. The aortic valve is tricuspid. Aortic valve regurgitation is not  visualized.   6. The inferior vena cava is normal in size with greater than 50%  respiratory variability, suggesting right atrial pressure of 3 mmHg.   7. The aortic valve is not well seen. It is trileaflet. There appears to  be some calcification associated with the non-coronary cusp but this is  not well seen.    08/25/2019 Imaging   Whole body Bone Scan  IMPRESSION: No significant abnormality identified.   08/25/2019 Imaging   CT CAP w Contrast  IMPRESSION: 1. No findings of metastatic disease to the chest, abdomen, or pelvis. 2. 0.7 by 0.3 cm sclerotic lesion in the right seventh rib laterally is likely benign but merits surveillance. 3. Mild sclerosis along the sacroiliac joints bilaterally, query mild chronic sacroiliitis.     09/01/2019  Procedure   INSERTION PORT-A-CATH WITH ULTRASOUND GUIDANCE by Dr. Georgette Dover   09/02/2019 -  Chemotherapy   TCH (Taxol, Carboplatin, Herceptin) q3weeks for 6 cycles starting 09/02/19, followed by anti-HER2 maintenance therapy with Herceptin q3weeks to complete 1 year of treatment.     Invasive ductal carcinoma of breast, female, left ([place])  07/13/2019 Initial Diagnosis   Invasive ductal carcinoma of breast, female, left ([place])      CURRENT THERAPY:  [place] q3weeks for 6 cycles starting 09/02/19, followed by maintenance Herceptinto complete one year  therapy  INTERVAL HISTORY:  Victoria Barker is here for a follow up and treatment. She presents to the clinic alone. She notes she still has leg aches 8-9/10 which is her main issues. She notes she did have nausea for the first time after C4. She notes her pain goes from her anterior hips to her ankle. She also notes LE ankle swelling occasionally, without pain. She notes her swelling is usually equal between the legs. She did have mild injury to her right leg. She notes she is gaining weight. She notes stretching her leg help her pain. She does not feel this is related to her Udenyca injection. She notes she has not been reached by cardiologist office for consult. She plans to return to work on 01/10/20 based on her FMLA. She notes she will have to work 30 days before she can go on leave for her reconstruction surgery. She notes she has been able to maintain most her hair with Dignicap.    REVIEW OF SYSTEMS:   Constitutional: Denies fevers, chills or abnormal weight loss Eyes: Denies blurriness of vision Ears, nose, mouth, throat, and face: Denies mucositis or sore throat Respiratory: Denies cough, dyspnea or wheezes Cardiovascular: Denies palpitation, chest discomfort (+) lower extremity swelling, right more than left  Gastrointestinal:  Denies nausea, heartburn or change in bowel habits Skin: Denies abnormal skin rashes MSK: (+) B/l leg aches  Lymphatics: Denies new lymphadenopathy or easy bruising Neurological:Denies numbness, tingling or new weaknesses Behavioral/Psych: Mood is stable, no new changes  All other systems were reviewed with the patient and are negative.  MEDICAL HISTORY:  Past Medical History:  Diagnosis Date   Cancer ([place])    Left breast Ca   GERD (gastroesophageal reflux disease)    Scoliosis    Sickle cell trait ([place])     SURGICAL HISTORY: Past Surgical History:  Procedure Laterality Date   BREAST RECONSTRUCTION WITH PLACEMENT OF TISSUE EXPANDER AND ALLODERM  Left 07/13/2019   Procedure: LEFT BREAST RECONSTRUCTION WITH PLACEMENT OF TISSUE EXPANDER AND ALLODERM;  Surgeon: Irene Limbo, MD;  Location: [facility];  Service: Plastics;  Laterality: Left;   DEBRIDEMENT AND CLOSURE WOUND Left 07/30/2019   Procedure: DEBRIDEMENT LEFT MASTECTOMY FLAP;  Surgeon: Irene Limbo, MD;  Location: [facility];  Service: Plastics;  Laterality: Left;   INDUCED ABORTION  2017   NIPPLE SPARING MASTECTOMY WITH SENTINEL LYMPH NODE BIOPSY Left 07/13/2019   Procedure: LEFT NIPPLE SPARING MASTECTOMY WITH SENTINEL LYMPH NODE BIOPSY;  Surgeon: Donnie Mesa, MD;  Location: [place];  Service: General;  Laterality: Left;   PORTACATH PLACEMENT Right 09/01/2019   Procedure: INSERTION PORT-A-CATH WITH ULTRASOUND GUIDANCE;  Surgeon: Donnie Mesa, MD;  Location: [place];  Service: General;  Laterality: Right;   TISSUE EXPANDER PLACEMENT Left 07/30/2019   Procedure: TISSUE EXPANSION LEFT CHEST;  Surgeon: Irene Limbo, MD;  Location: [facility];  Service: Plastics;  Laterality: Left;    I have  reviewed the social history and family history with the patient and they are unchanged from previous note.  ALLERGIES:  is allergic to bactrim [sulfamethoxazole-trimethoprim].  MEDICATIONS:  Current Outpatient Medications  Medication Sig Dispense Refill   dexamethasone (DECADRON) 4 MG tablet      dexlansoprazole (DEXILANT) 60 MG capsule Take 60 mg by mouth daily.     HYDROcodone-acetaminophen (NORCO/VICODIN) 5-325 MG tablet Take 1 tablet by mouth every 6 (six) hours as needed for moderate pain. 15 tablet 0   Lactobacillus-Inulin (CULTURELLE DIGESTIVE HEALTH PO) Take 1 capsule by mouth daily as needed (digestive health.).      lidocaine-prilocaine (EMLA) cream Apply 1 application topically as needed. 30 g 0   metroNIDAZOLE (METROGEL) 0.75 % vaginal gel Place 1.69 application vaginally daily.     Multiple Vitamin (MULTIVITAMIN WITH  MINERALS) TABS tablet Take 1 tablet by mouth 3 (three) times a week. WOMEN'S ONE-A-DAY     ondansetron (ZOFRAN) 8 MG tablet Take 1 tablet (8 mg total) by mouth 2 (two) times daily as needed for refractory nausea / vomiting. Start on day 3 after chemo. 30 tablet 1   prochlorperazine (COMPAZINE) 10 MG tablet Take 1 tablet (10 mg total) by mouth every 6 (six) hours as needed (Nausea or vomiting). 30 tablet 1   No current facility-administered medications for this visit.   Facility-Administered Medications Ordered in Other Visits  Medication Dose Route Frequency Provider Last Rate Last Admin   sodium chloride flush (NS) 0.9 % injection 10 mL  10 mL Intracatheter PRN Truitt Merle, MD   10 mL at 11/25/19 1438    PHYSICAL EXAMINATION: ECOG PERFORMANCE STATUS: 1 - Symptomatic but completely ambulatory  Vitals:   11/25/19 1001  BP: 119/82  Pulse: (!) 112  Resp: 14  Temp: 98.6 F (37 C)  SpO2: 97%   Filed Weights   11/25/19 1001  Weight: 140 lb 8 oz (63.7 kg)    GENERAL:alert, no distress and comfortable SKIN: skin color, texture, turgor are normal, no rashes or significant lesions EYES: normal, Conjunctiva are pink and non-injected, sclera clear  NECK: supple, thyroid normal size, non-tender, without nodularity LYMPH:  no palpable lymphadenopathy in the cervical, axillary  LUNGS: clear to auscultation and percussion with normal breathing effort HEART: regular rate & rhythm and no murmurs (+) Mild lower extremity edema, R>L ABDOMEN:abdomen soft, non-tender and normal bowel sounds Musculoskeletal:no cyanosis of digits and no clubbing  NEURO: alert & oriented x 3 with fluent speech, no focal motor/sensory deficits  LABORATORY DATA:  I have reviewed the data as listed CBC Latest Ref Rng & Units 11/25/2019 11/04/2019 10/14/2019  WBC 4.0 - 10.5 K/uL 10.6(H) 7.9 12.7(H)  Hemoglobin 12.0 - 15.0 g/dL 10.6(L) 10.5(L) 10.7(L)  Hematocrit 36.0 - 46.0 % 31.6(L) 31.7(L) 31.8(L)  Platelets 150 - 400  K/uL 265 256 249     CMP Latest Ref Rng & Units 11/25/2019 11/04/2019 10/14/2019  Glucose 70 - 99 mg/dL 107(H) 112(H) 121(H)  BUN 6 - 20 mg/dL 9 12 10   Creatinine 0.44 - 1.00 mg/dL 0.68 0.70 0.69  Sodium 135 - 145 mmol/L 138 139 138  Potassium 3.5 - 5.1 mmol/L 4.1 4.1 3.8  Chloride 98 - 111 mmol/L 104 106 105  CO2 22 - 32 mmol/L 25 24 25   Calcium 8.9 - 10.3 mg/dL 9.2 8.6(L) 9.0  Total Protein 6.5 - 8.1 g/dL 6.4(L) 6.0(L) 6.6  Total Bilirubin 0.3 - 1.2 mg/dL <0.2(L) <0.2(L) <0.2(L)  Alkaline Phos 38 - 126 U/L 65 78 83  AST 15 - 41 U/L 15 19 23   ALT 0 - 44 U/L 14 27 44      RADIOGRAPHIC STUDIES: I have personally reviewed the radiological images as listed and agreed with the findings in the report. No results found.   ASSESSMENT & PLAN:  Kristia Jupiter is a 39 y.o. female with    1.Malignant neoplasm of upper-outer quadrant of left breast,invasive ductal carcinoma and DCIS, stageIIA,pT2N0M0, ER+/PR-/HER2+, GradeIII -She was diagnosed in 04/2019. She was found to have2 left breast masses with DCIS and 1 mass with Invasive ductal carcinoma, her abnormal lymph node was biopsied and found to be negative. -Her invasive tumor was ER positive, PR negative, HER-2 equivocal by IHC, negative by [facility] initial biopsy -On 07/13/19 she underwent left breast mastectomy with Dr. Prince Solian and reconstruction with Dr. Iran Planas. We discussed her path which showedcomplete surgical resection of her 3.4cmIDCwith clear margins and node negative. Tumor was found to be HER2 positive on resected sample  andOncotypetesting (ordered before HER2 came back positive)withrecurrence score of 56.This is consistent with aggressive nature of HER-2 positive breast cancer. -To reduce her high risk of recurrence I started her on adjuvant chemotherapywithTCH q3weeks for 6 cycles starting 09/02/19,followed by maintenance Herceptinto complete one year therapy. -S/p C4 TCH she had mild nausea. She also continues  to have leg aches bilaterally, probably from docetaxel. Otherwise she is tolerating treatment well. Her hair is maintaining with Dignicap, will continue.  -Labs reviewed, WBC 10.6, Hg 10.6, ANC 8.1. Overall adequate to proceed with C5 [place] today with dose reduction of docetaxel and carboplatin.  -Given her work schedule and her living in [place], she rather have Herceptin injections for maintenance therapy. I will check this with her insurance after last cycle chemo.  -She is due for ECHO in June. I will send cardiology referral again.  -I answered all her questions to her understanding and satisfaction.  -I encouraged her to wait 1 month after chemo to proceed with COVID19 vaccine.  -F/u in 3 weeks, will cancel her GCSF after last cycle chemo    2. B/l leg aches, LE ankle swelling  -She has had persistent b/l leg aches (5+/10) secondary to docetaxel. I will reduce her dose for Cycle 5-6.  -She has been taking Tylenol and ibuprofen. She has only used 1 hydrocodone which helped her. She is overall still able to function. She notes stretching her legs help, I encouraged her to continue to stretch and exercise.  -I refilled her hydrocodone #15 tabs today which she is to use only for severe pain (11/25/19).  -She notes her b/l ankle swelling is usually even on both sides. On today's exam her right leg seems to be larger then her left. I discussed chemo can increase risk of blood clots. I recommend doppler for further eval. She is agreeable.    3. Nicotine use, Smoking Cessation  -She smoked cigarettes for 2 years, less than 1/2ppd. She currently smokes jewel pod containing nicotine for the past 3 years.  -Smoking cessationhas been reviewedwith her. She is willing to quit.   4. Acid Reflux -Well controlled on Dexilant  5. Constipation  -She will continues daily prophylactic stool softener   PLAN: -Right leg Doppler today or tomorrow to rule out DVT  -I called in Hydrocodone today    -Labs reviewed and adequate to proceed with C5 TCH today with dose reduced docetaxel/carboplatin, with GCSF support  -Lab, flush, f/u and Chemo TCHin 3 weeks. No GCSF with next cycle chemo (last  cycle)  -Lab, flush, f/u and Herceptinin 6 weeks.  -Echo after cycle C5, Send cardiology referral.    No problem-specific Assessment & Plan notes found for this encounter.   Orders Placed This Encounter  Procedures   Ambulatory referral to Cardiology    Referral Priority:   Routine    Referral Type:   Consultation    Referral Reason:   Specialty Services Required    Requested Specialty:   Cardiology    Number of Visits Requested:   1   ECHOCARDIOGRAM COMPLETE    Standing Status:   Future    Standing Expiration Date:   11/24/2020    Order Specific Question:   Where should this test be performed    Answer:   [facility]    Order Specific Question:   Perflutren DEFINITY (image enhancing agent) should be administered unless hypersensitivity or allergy exist    Answer:   Administer Perflutren    Order Specific Question:   Does this study need to be read by the Structural team/Level 3 readers?    Answer:   No    Order Specific Question:   Reason for exam-Echo    Answer:   Chemo  V67.2 / Z09   All questions were answered. The patient knows to call the clinic with any problems, questions or concerns. No barriers to learning was detected. The total time spent in the appointment was 30 minutes.     Truitt Merle, MD 11/25/2019   I, Joslyn Devon, am acting as scribe for Truitt Merle, MD.   I have reviewed the above documentation for accuracy and completeness, and I agree with the above.

## 2019-11-25 ENCOUNTER — Other Ambulatory Visit: Payer: Self-pay

## 2019-11-25 ENCOUNTER — Encounter: Payer: Self-pay | Admitting: Hematology

## 2019-11-25 ENCOUNTER — Inpatient Hospital Stay: Payer: BC Managed Care – PPO | Attending: Hematology

## 2019-11-25 ENCOUNTER — Inpatient Hospital Stay: Payer: BC Managed Care – PPO

## 2019-11-25 ENCOUNTER — Inpatient Hospital Stay (HOSPITAL_BASED_OUTPATIENT_CLINIC_OR_DEPARTMENT_OTHER): Payer: BC Managed Care – PPO | Admitting: Hematology

## 2019-11-25 VITALS — BP 119/82 | HR 112 | Temp 98.6°F | Resp 14 | Wt 140.5 lb

## 2019-11-25 VITALS — HR 94

## 2019-11-25 DIAGNOSIS — Z5112 Encounter for antineoplastic immunotherapy: Secondary | ICD-10-CM | POA: Diagnosis present

## 2019-11-25 DIAGNOSIS — Z5189 Encounter for other specified aftercare: Secondary | ICD-10-CM | POA: Insufficient documentation

## 2019-11-25 DIAGNOSIS — C50412 Malignant neoplasm of upper-outer quadrant of left female breast: Secondary | ICD-10-CM

## 2019-11-25 DIAGNOSIS — Z5111 Encounter for antineoplastic chemotherapy: Secondary | ICD-10-CM | POA: Insufficient documentation

## 2019-11-25 DIAGNOSIS — Z170 Estrogen receptor positive status [ER+]: Secondary | ICD-10-CM

## 2019-11-25 DIAGNOSIS — C50912 Malignant neoplasm of unspecified site of left female breast: Secondary | ICD-10-CM

## 2019-11-25 DIAGNOSIS — Z95828 Presence of other vascular implants and grafts: Secondary | ICD-10-CM

## 2019-11-25 LAB — CMP (CANCER CENTER ONLY)
ALT: 14 U/L (ref 0–44)
AST: 15 U/L (ref 15–41)
Albumin: 3.3 g/dL — ABNORMAL LOW (ref 3.5–5.0)
Alkaline Phosphatase: 65 U/L (ref 38–126)
Anion gap: 9 (ref 5–15)
BUN: 9 mg/dL (ref 6–20)
CO2: 25 mmol/L (ref 22–32)
Calcium: 9.2 mg/dL (ref 8.9–10.3)
Chloride: 104 mmol/L (ref 98–111)
Creatinine: 0.68 mg/dL (ref 0.44–1.00)
GFR, Est AFR Am: >60 mL/min (ref >60)
GFR, Estimated: >60 mL/min (ref >60)
Glucose, Bld: 107 mg/dL — ABNORMAL HIGH (ref 70–99)
Potassium: 4.1 mmol/L (ref 3.5–5.1)
Sodium: 138 mmol/L (ref 135–145)
Total Bilirubin: <0.2 mg/dL — ABNORMAL LOW (ref 0.3–1.2)
Total Protein: 6.4 g/dL — ABNORMAL LOW (ref 6.5–8.1)

## 2019-11-25 LAB — CBC WITH DIFFERENTIAL (CANCER CENTER ONLY)
Abs Immature Granulocytes: 0.08 10*3/uL — ABNORMAL HIGH (ref 0.00–0.07)
Basophils Absolute: 0 10*3/uL (ref 0.0–0.1)
Basophils Relative: 0 %
Eosinophils Absolute: 0 10*3/uL (ref 0.0–0.5)
Eosinophils Relative: 0 %
HCT: 31.6 % — ABNORMAL LOW (ref 36.0–46.0)
Hemoglobin: 10.6 g/dL — ABNORMAL LOW (ref 12.0–15.0)
Immature Granulocytes: 1 %
Lymphocytes Relative: 14 %
Lymphs Abs: 1.5 10*3/uL (ref 0.7–4.0)
MCH: 30.9 pg (ref 26.0–34.0)
MCHC: 33.5 g/dL (ref 30.0–36.0)
MCV: 92.1 fL (ref 80.0–100.0)
Monocytes Absolute: 0.9 10*3/uL (ref 0.1–1.0)
Monocytes Relative: 8 %
Neutro Abs: 8.1 10*3/uL — ABNORMAL HIGH (ref 1.7–7.7)
Neutrophils Relative %: 77 %
Platelet Count: 265 10*3/uL (ref 150–400)
RBC: 3.43 MIL/uL — ABNORMAL LOW (ref 3.87–5.11)
RDW: 15.9 % — ABNORMAL HIGH (ref 11.5–15.5)
WBC Count: 10.6 10*3/uL — ABNORMAL HIGH (ref 4.0–10.5)
nRBC: 0 % (ref 0.0–0.2)

## 2019-11-25 LAB — PREGNANCY, URINE: Preg Test, Ur: NEGATIVE

## 2019-11-25 MED ORDER — SODIUM CHLORIDE 0.9 % IV SOLN
10.0000 mg | Freq: Once | INTRAVENOUS | Status: AC
  Administered 2019-11-25: 10 mg via INTRAVENOUS
  Filled 2019-11-25: qty 10

## 2019-11-25 MED ORDER — SODIUM CHLORIDE 0.9 % IV SOLN
524.5000 mg | Freq: Once | INTRAVENOUS | Status: AC
  Administered 2019-11-25: 520 mg via INTRAVENOUS
  Filled 2019-11-25: qty 52

## 2019-11-25 MED ORDER — SODIUM CHLORIDE 0.9 % IV SOLN
60.0000 mg/m2 | Freq: Once | INTRAVENOUS | Status: AC
  Administered 2019-11-25: 90 mg via INTRAVENOUS
  Filled 2019-11-25: qty 9

## 2019-11-25 MED ORDER — SODIUM CHLORIDE 0.9% FLUSH
10.0000 mL | INTRAVENOUS | Status: DC | PRN
  Administered 2019-11-25: 10 mL
  Filled 2019-11-25: qty 10

## 2019-11-25 MED ORDER — PROCHLORPERAZINE MALEATE 10 MG PO TABS
10.0000 mg | ORAL_TABLET | Freq: Once | ORAL | Status: AC
  Administered 2019-11-25: 10 mg via ORAL

## 2019-11-25 MED ORDER — DIPHENHYDRAMINE HCL 25 MG PO CAPS
25.0000 mg | ORAL_CAPSULE | Freq: Once | ORAL | Status: AC
  Administered 2019-11-25: 25 mg via ORAL

## 2019-11-25 MED ORDER — SODIUM CHLORIDE 0.9 % IV SOLN
Freq: Once | INTRAVENOUS | Status: AC
  Filled 2019-11-25: qty 250

## 2019-11-25 MED ORDER — HEPARIN SOD (PORK) LOCK FLUSH 100 UNIT/ML IV SOLN
500.0000 [IU] | Freq: Once | INTRAVENOUS | Status: AC | PRN
  Administered 2019-11-25: 500 [IU]
  Filled 2019-11-25: qty 5

## 2019-11-25 MED ORDER — SODIUM CHLORIDE 0.9% FLUSH
10.0000 mL | INTRAVENOUS | Status: DC | PRN
  Administered 2019-11-25: 10 mL via INTRAVENOUS
  Filled 2019-11-25: qty 10

## 2019-11-25 MED ORDER — TRASTUZUMAB-DKST CHEMO 150 MG IV SOLR
6.0000 mg/kg | Freq: Once | INTRAVENOUS | Status: AC
  Administered 2019-11-25: 357 mg via INTRAVENOUS
  Filled 2019-11-25: qty 17

## 2019-11-25 MED ORDER — PROCHLORPERAZINE MALEATE 10 MG PO TABS
ORAL_TABLET | ORAL | Status: AC
  Filled 2019-11-25: qty 1

## 2019-11-25 MED ORDER — PALONOSETRON HCL INJECTION 0.25 MG/5ML
INTRAVENOUS | Status: AC
  Filled 2019-11-25: qty 5

## 2019-11-25 MED ORDER — DIPHENHYDRAMINE HCL 25 MG PO CAPS
ORAL_CAPSULE | ORAL | Status: AC
  Filled 2019-11-25: qty 1

## 2019-11-25 MED ORDER — ACETAMINOPHEN 325 MG PO TABS
ORAL_TABLET | ORAL | Status: AC
  Filled 2019-11-25: qty 2

## 2019-11-25 MED ORDER — HYDROCODONE-ACETAMINOPHEN 5-325 MG PO TABS: 1.0000 | ORAL_TABLET | Freq: Four times a day (QID) | ORAL | 0 refills | Status: DC | PRN

## 2019-11-25 MED ORDER — ACETAMINOPHEN 325 MG PO TABS
650.0000 mg | ORAL_TABLET | Freq: Once | ORAL | Status: AC
  Administered 2019-11-25: 650 mg via ORAL

## 2019-11-25 MED ORDER — SODIUM CHLORIDE 0.9 % IV SOLN
150.0000 mg | Freq: Once | INTRAVENOUS | Status: AC
  Administered 2019-11-25: 150 mg via INTRAVENOUS
  Filled 2019-11-25: qty 150

## 2019-11-25 MED ORDER — PALONOSETRON HCL INJECTION 0.25 MG/5ML
0.2500 mg | Freq: Once | INTRAVENOUS | Status: AC
  Administered 2019-11-25: 0.25 mg via INTRAVENOUS

## 2019-11-25 NOTE — Patient Instructions (Signed)
[facility] Discharge Instructions for Patients Receiving Chemotherapy  Today you received the following chemotherapy agents: trastuzumab, docetaxel, and carboplatin.  To help prevent nausea and vomiting after your treatment, we encourage you to take your nausea medication as directed.   If you develop nausea and vomiting that is not controlled by your nausea medication, call the clinic.   BELOW ARE SYMPTOMS THAT SHOULD BE REPORTED IMMEDIATELY:  *FEVER GREATER THAN 100.5 F  *CHILLS WITH OR WITHOUT FEVER  NAUSEA AND VOMITING THAT IS NOT CONTROLLED WITH YOUR NAUSEA MEDICATION  *UNUSUAL SHORTNESS OF BREATH  *UNUSUAL BRUISING OR BLEEDING  TENDERNESS IN MOUTH AND THROAT WITH OR WITHOUT PRESENCE OF ULCERS  *URINARY PROBLEMS  *BOWEL PROBLEMS  UNUSUAL RASH Items with * indicate a potential emergency and should be followed up as soon as possible.  Feel free to call the clinic should you have any questions or concerns. The clinic phone number is [phone].  Please show the CHEMO ALERT CARD at check-in to the Emergency Department and triage nurse.   

## 2019-11-25 NOTE — Patient Instructions (Signed)

## 2019-11-26 ENCOUNTER — Other Ambulatory Visit: Payer: Self-pay

## 2019-11-26 ENCOUNTER — Telehealth: Payer: Self-pay | Admitting: Hematology

## 2019-11-26 ENCOUNTER — Ambulatory Visit (HOSPITAL_COMMUNITY)
Admission: RE | Admit: 2019-11-26 | Discharge: 2019-11-26 | Disposition: A | Discharge status: Home / Self Care | Payer: BC Managed Care – PPO | Source: Ambulatory Visit | Attending: Hematology | Admitting: Hematology

## 2019-11-26 ENCOUNTER — Inpatient Hospital Stay: Payer: BC Managed Care – PPO

## 2019-11-26 VITALS — BP 118/72 | HR 78 | Temp 98.2°F | Resp 18

## 2019-11-26 DIAGNOSIS — Z170 Estrogen receptor positive status [ER+]: Secondary | ICD-10-CM | POA: Diagnosis not present

## 2019-11-26 DIAGNOSIS — Z5111 Encounter for antineoplastic chemotherapy: Secondary | ICD-10-CM | POA: Diagnosis not present

## 2019-11-26 DIAGNOSIS — C50912 Malignant neoplasm of unspecified site of left female breast: Secondary | ICD-10-CM

## 2019-11-26 DIAGNOSIS — C50412 Malignant neoplasm of upper-outer quadrant of left female breast: Secondary | ICD-10-CM

## 2019-11-26 MED ORDER — PEGFILGRASTIM-JMDB 6 MG/0.6ML ~~LOC~~ SOSY
PREFILLED_SYRINGE | SUBCUTANEOUS | Status: AC
  Filled 2019-11-26: qty 0.6

## 2019-11-26 MED ORDER — PEGFILGRASTIM-JMDB 6 MG/0.6ML ~~LOC~~ SOSY
6.0000 mg | PREFILLED_SYRINGE | Freq: Once | SUBCUTANEOUS | Status: AC
  Administered 2019-11-26: 6 mg via SUBCUTANEOUS

## 2019-11-26 NOTE — Telephone Encounter (Signed)
Scheduled appt 6/3 los.  Pt will get an updated appt calendar at her next scheduled appt.

## 2019-11-26 NOTE — Patient Instructions (Signed)

## 2019-11-26 NOTE — Progress Notes (Signed)
Lower extremity venous has been completed.   Preliminary results in CV Proc.   Jinny Blossom Primrose Oler 11/26/2019 1:00 PM

## 2019-11-27 ENCOUNTER — Ambulatory Visit: Payer: BC Managed Care – PPO

## 2019-12-13 ENCOUNTER — Other Ambulatory Visit: Payer: Self-pay

## 2019-12-13 ENCOUNTER — Ambulatory Visit
Admission: RE | Admit: 2019-12-13 | Discharge: 2019-12-13 | Disposition: A | Discharge status: Home / Self Care | Payer: BC Managed Care – PPO | Source: Ambulatory Visit | Attending: Hematology | Admitting: Hematology

## 2019-12-13 DIAGNOSIS — K219 Gastro-esophageal reflux disease without esophagitis: Secondary | ICD-10-CM | POA: Diagnosis not present

## 2019-12-13 DIAGNOSIS — Z170 Estrogen receptor positive status [ER+]: Secondary | ICD-10-CM | POA: Diagnosis not present

## 2019-12-13 DIAGNOSIS — C50412 Malignant neoplasm of upper-outer quadrant of left female breast: Secondary | ICD-10-CM

## 2019-12-13 NOTE — Progress Notes (Signed)
*  PRELIMINARY RESULTS* Echocardiogram 2D Echocardiogram has been performed.  Sherrie Sport 12/13/2019, 11:52 AM

## 2019-12-16 ENCOUNTER — Ambulatory Visit: Payer: BC Managed Care – PPO | Admitting: Adult Health

## 2019-12-16 ENCOUNTER — Other Ambulatory Visit: Payer: BC Managed Care – PPO

## 2019-12-16 ENCOUNTER — Inpatient Hospital Stay: Payer: BC Managed Care – PPO

## 2019-12-16 ENCOUNTER — Inpatient Hospital Stay (HOSPITAL_BASED_OUTPATIENT_CLINIC_OR_DEPARTMENT_OTHER): Payer: BC Managed Care – PPO | Admitting: Adult Health

## 2019-12-16 ENCOUNTER — Encounter: Payer: Self-pay | Admitting: *Deleted

## 2019-12-16 ENCOUNTER — Ambulatory Visit: Payer: BC Managed Care – PPO | Admitting: Hematology

## 2019-12-16 ENCOUNTER — Encounter: Payer: Self-pay | Admitting: Adult Health

## 2019-12-16 ENCOUNTER — Other Ambulatory Visit: Payer: Self-pay

## 2019-12-16 VITALS — BP 127/75 | HR 97 | Temp 98.5°F | Resp 16 | Wt 141.6 lb

## 2019-12-16 DIAGNOSIS — Z170 Estrogen receptor positive status [ER+]: Secondary | ICD-10-CM | POA: Diagnosis not present

## 2019-12-16 DIAGNOSIS — C50912 Malignant neoplasm of unspecified site of left female breast: Secondary | ICD-10-CM

## 2019-12-16 DIAGNOSIS — C50412 Malignant neoplasm of upper-outer quadrant of left female breast: Secondary | ICD-10-CM

## 2019-12-16 DIAGNOSIS — Z95828 Presence of other vascular implants and grafts: Secondary | ICD-10-CM | POA: Insufficient documentation

## 2019-12-16 DIAGNOSIS — Z5111 Encounter for antineoplastic chemotherapy: Secondary | ICD-10-CM | POA: Diagnosis not present

## 2019-12-16 LAB — CBC WITH DIFFERENTIAL (CANCER CENTER ONLY)
Abs Immature Granulocytes: 0.03 10*3/uL (ref 0.00–0.07)
Basophils Absolute: 0 10*3/uL (ref 0.0–0.1)
Basophils Relative: 0 %
Eosinophils Absolute: 0 10*3/uL (ref 0.0–0.5)
Eosinophils Relative: 0 %
HCT: 35.7 % — ABNORMAL LOW (ref 36.0–46.0)
Hemoglobin: 11.8 g/dL — ABNORMAL LOW (ref 12.0–15.0)
Immature Granulocytes: 0 %
Lymphocytes Relative: 10 %
Lymphs Abs: 0.9 10*3/uL (ref 0.7–4.0)
MCH: 31.3 pg (ref 26.0–34.0)
MCHC: 33.1 g/dL (ref 30.0–36.0)
MCV: 94.7 fL (ref 80.0–100.0)
Monocytes Absolute: 0.4 10*3/uL (ref 0.1–1.0)
Monocytes Relative: 4 %
Neutro Abs: 7.3 10*3/uL (ref 1.7–7.7)
Neutrophils Relative %: 86 %
Platelet Count: 230 10*3/uL (ref 150–400)
RBC: 3.77 MIL/uL — ABNORMAL LOW (ref 3.87–5.11)
RDW: 15.2 % (ref 11.5–15.5)
WBC Count: 8.6 10*3/uL (ref 4.0–10.5)
nRBC: 0 % (ref 0.0–0.2)

## 2019-12-16 LAB — CMP (CANCER CENTER ONLY)
ALT: 19 U/L (ref 0–44)
AST: 20 U/L (ref 15–41)
Albumin: 3.5 g/dL (ref 3.5–5.0)
Alkaline Phosphatase: 57 U/L (ref 38–126)
Anion gap: 9 (ref 5–15)
BUN: 10 mg/dL (ref 6–20)
CO2: 22 mmol/L (ref 22–32)
Calcium: 9.2 mg/dL (ref 8.9–10.3)
Chloride: 105 mmol/L (ref 98–111)
Creatinine: 0.73 mg/dL (ref 0.44–1.00)
GFR, Est AFR Am: >60 mL/min (ref >60)
GFR, Estimated: >60 mL/min (ref >60)
Glucose, Bld: 100 mg/dL — ABNORMAL HIGH (ref 70–99)
Potassium: 4.1 mmol/L (ref 3.5–5.1)
Sodium: 136 mmol/L (ref 135–145)
Total Bilirubin: 0.3 mg/dL (ref 0.3–1.2)
Total Protein: 6.6 g/dL (ref 6.5–8.1)

## 2019-12-16 LAB — PREGNANCY, URINE: Preg Test, Ur: NEGATIVE

## 2019-12-16 MED ORDER — TRASTUZUMAB-DKST CHEMO 150 MG IV SOLR
6.0000 mg/kg | Freq: Once | INTRAVENOUS | Status: AC
  Administered 2019-12-16: 357 mg via INTRAVENOUS
  Filled 2019-12-16: qty 17

## 2019-12-16 MED ORDER — PROCHLORPERAZINE MALEATE 10 MG PO TABS
ORAL_TABLET | ORAL | Status: AC
  Filled 2019-12-16: qty 1

## 2019-12-16 MED ORDER — SODIUM CHLORIDE 0.9 % IV SOLN
Freq: Once | INTRAVENOUS | Status: AC
  Filled 2019-12-16: qty 250

## 2019-12-16 MED ORDER — SODIUM CHLORIDE 0.9 % IV SOLN
150.0000 mg | Freq: Once | INTRAVENOUS | Status: AC
  Administered 2019-12-16: 150 mg via INTRAVENOUS
  Filled 2019-12-16: qty 150

## 2019-12-16 MED ORDER — HEPARIN SOD (PORK) LOCK FLUSH 100 UNIT/ML IV SOLN
500.0000 [IU] | Freq: Once | INTRAVENOUS | Status: AC | PRN
  Administered 2019-12-16: 500 [IU]
  Filled 2019-12-16: qty 5

## 2019-12-16 MED ORDER — FUROSEMIDE 20 MG PO TABS: 20.0000 mg | ORAL_TABLET | Freq: Every day | ORAL | 0 refills | Status: DC

## 2019-12-16 MED ORDER — DIPHENHYDRAMINE HCL 25 MG PO CAPS
ORAL_CAPSULE | ORAL | Status: AC
  Filled 2019-12-16: qty 1

## 2019-12-16 MED ORDER — SODIUM CHLORIDE 0.9 % IV SOLN
60.0000 mg/m2 | Freq: Once | INTRAVENOUS | Status: AC
  Administered 2019-12-16: 90 mg via INTRAVENOUS
  Filled 2019-12-16: qty 9

## 2019-12-16 MED ORDER — SODIUM CHLORIDE 0.9 % IV SOLN
10.0000 mg | Freq: Once | INTRAVENOUS | Status: AC
  Administered 2019-12-16: 10 mg via INTRAVENOUS
  Filled 2019-12-16: qty 10

## 2019-12-16 MED ORDER — ACETAMINOPHEN 325 MG PO TABS
ORAL_TABLET | ORAL | Status: AC
  Filled 2019-12-16: qty 2

## 2019-12-16 MED ORDER — PALONOSETRON HCL INJECTION 0.25 MG/5ML
0.2500 mg | Freq: Once | INTRAVENOUS | Status: AC
  Administered 2019-12-16: 0.25 mg via INTRAVENOUS

## 2019-12-16 MED ORDER — SODIUM CHLORIDE 0.9% FLUSH
10.0000 mL | INTRAVENOUS | Status: DC | PRN
  Administered 2019-12-16: 10 mL
  Filled 2019-12-16: qty 10

## 2019-12-16 MED ORDER — SODIUM CHLORIDE 0.9% FLUSH
10.0000 mL | Freq: Once | INTRAVENOUS | Status: AC
  Administered 2019-12-16: 10 mL
  Filled 2019-12-16: qty 10

## 2019-12-16 MED ORDER — SODIUM CHLORIDE 0.9 % IV SOLN
524.5000 mg | Freq: Once | INTRAVENOUS | Status: AC
  Administered 2019-12-16: 520 mg via INTRAVENOUS
  Filled 2019-12-16: qty 52

## 2019-12-16 MED ORDER — DIPHENHYDRAMINE HCL 25 MG PO CAPS
25.0000 mg | ORAL_CAPSULE | Freq: Once | ORAL | Status: AC
  Administered 2019-12-16: 25 mg via ORAL

## 2019-12-16 MED ORDER — PROCHLORPERAZINE MALEATE 10 MG PO TABS
10.0000 mg | ORAL_TABLET | Freq: Once | ORAL | Status: AC
  Administered 2019-12-16: 10 mg via ORAL

## 2019-12-16 MED ORDER — PALONOSETRON HCL INJECTION 0.25 MG/5ML
INTRAVENOUS | Status: AC
  Filled 2019-12-16: qty 5

## 2019-12-16 MED ORDER — ACETAMINOPHEN 325 MG PO TABS
650.0000 mg | ORAL_TABLET | Freq: Once | ORAL | Status: AC
  Administered 2019-12-16: 650 mg via ORAL

## 2019-12-16 NOTE — Patient Instructions (Signed)
[facility] Discharge Instructions for Patients Receiving Chemotherapy  Today you received the following immunotherapy agent: Trastuzumab and chemotherapy agents: Docetaxel (Taxotere) and Carboplatin.  To help prevent nausea and vomiting after your treatment, we encourage you to take your nausea medication as directed by your MD.   If you develop nausea and vomiting that is not controlled by your nausea medication, call the clinic.   BELOW ARE SYMPTOMS THAT SHOULD BE REPORTED IMMEDIATELY:  *FEVER GREATER THAN 100.5 F  *CHILLS WITH OR WITHOUT FEVER  NAUSEA AND VOMITING THAT IS NOT CONTROLLED WITH YOUR NAUSEA MEDICATION  *UNUSUAL SHORTNESS OF BREATH  *UNUSUAL BRUISING OR BLEEDING  TENDERNESS IN MOUTH AND THROAT WITH OR WITHOUT PRESENCE OF ULCERS  *URINARY PROBLEMS  *BOWEL PROBLEMS  UNUSUAL RASH Items with * indicate a potential emergency and should be followed up as soon as possible.  Feel free to call the clinic should you have any questions or concerns. The clinic phone number is [phone].  Please show the CHEMO ALERT CARD at check-in to the Emergency Department and triage nurse.   

## 2019-12-16 NOTE — Progress Notes (Signed)
[facility] [place] [facility] Follow up:    Victoria Barker, Victoria Barker   DIAGNOSIS: Cancer Staging Malignant neoplasm of upper-outer quadrant of left breast in female, estrogen receptor positive ([place]) Staging form: Breast, AJCC 8th Edition - Clinical stage from 04/28/2019: Stage IIA (cT2, cN0, cM0, G2, ER+, PR-, HER2: Equivocal) - Signed by Truitt Merle, MD on 05/04/2019 - Pathologic stage from 07/13/2019: Stage IIA (pT2, pN0, cM0, G3, ER+, PR-, HER2+, Oncotype DX score: 56) - Signed by Truitt Merle, MD on 08/20/2019   SUMMARY OF ONCOLOGIC HISTORY: Oncology History Overview Note  Cancer Staging Malignant neoplasm of upper-outer quadrant of left breast in female, estrogen receptor positive ([place]) Staging form: Breast, AJCC 8th Edition - Clinical stage from 04/28/2019: Stage IIA (cT2, cN0, cM0, G2, ER+, PR-, HER2: Equivocal) - Signed by Truitt Merle, MD on 05/04/2019 - Pathologic stage from 07/13/2019: Stage IIA (pT2, pN0, cM0, G3, ER+, PR-, HER2+, Oncotype DX score: 56) - Signed by Truitt Merle, MD on 08/20/2019    Malignant neoplasm of upper-outer quadrant of left breast in female, estrogen receptor positive ([place])  04/21/2019 Mammogram   Diagnostic mammogram 04/21/19  IMPRESSION 1. findings highly suspicious for left breast cancer. There is a palpable irregular mass 4cm from nipple measuring 2.4x2.4x1.8cm in the 1:00 position axis of the left breast and pleomorphic  calcifications in the upper-outer quadrant as described above.  2. single left 0.9x0.5cm axillary lymph node with a mild/borderline thickened cortex.    04/28/2019 Cancer Staging   Staging form: Breast, AJCC 8th Edition - Clinical stage from 04/28/2019: Stage IIA (cT2, cN0, cM0, G2, ER+, PR-, HER2: Equivocal) - Signed by Truitt Merle, MD on 05/04/2019   04/28/2019 Initial Biopsy   Diagnosis 04/28/19 1. Breast, left, needle core biopsy, 1 o'clock position - INVASIVE DUCTAL CARCINOMA. - DUCTAL CARCINOMA IN  SITU. - SEE COMMENT. 2. Lymph node, needle/core biopsy, left axilla - THERE IS NO EVIDENCE OF CARCINOMA IN 1 OF 1 LYMPH NODE (0/1). 3. Breast, left, needle core biopsy, upper, outer quadrant - DUCTAL CARCINOMA IN SITU WITH CALCIFICATIONS. - SEE COMMENT.   04/28/2019 Receptors her2   1. PROGNOSTIC INDICATORS Results: HER2 negative  Estrogen Receptor: 80%, POSITIVE, STRONG STAINING INTENSITY Progesterone Receptor: 0%, NEGATIVE Proliferation Marker Ki67: 35%    04/30/2019 Initial Diagnosis   Malignant neoplasm of upper-outer quadrant of left breast in female, estrogen receptor positive ([name])    Genetic Testing   No pathogenic variants identified. 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 The final report date is 05/28/2019.  The STAT Breast cancer panel offered by [facility] includes sequencing and rearrangement analysis for the following 9 genes:  ATM, BRCA1, BRCA2, CDH1, CHEK2, PALB2, PTEN, STK11 and TP53.    The Common Hereditary Cancers Panel offered by [facility] includes sequencing and/or deletion duplication testing of the following 48 genes: APC, ATM, AXIN2, BARD1, BMPR1A, BRCA1, BRCA2, BRIP1, CDH1, CDKN2A (p14ARF), CDKN2A (p16INK4a), CKD4, CHEK2, CTNNA1, DICER1, EPCAM (Deletion/duplication testing only), GREM1 (promoter region deletion/duplication testing only), KIT, MEN1, MLH1, MSH2, MSH3, MSH6, MUTYH, NBN, NF1, NHTL1, PALB2, PDGFRA, PMS2, POLD1, POLE, PTEN, RAD50, RAD51C, RAD51D, RNF43, SDHB, SDHC, SDHD, SMAD4, SMARCA4. STK11, TP53, TSC1, TSC2, and VHL.  The following genes were evaluated for sequence changes only: SDHA and HOXB13 c.251G>A variant only.   05/11/2019 Breast MRI   IMPRESSION: 1. Enhancing mass and architectural distortion spanning approximately 6 x 3 x 5.3 cm in the  upper outer left breast consistent with the patient's biopsy-proven site of invasive cancer. 2. Biopsy changes in the anterior left  breast at the site of the patient's biopsy-proven ductal carcinoma in situ. 3. Suspicious changes within the upper-outer quadrant of the left breast spanning a total of 8 cm in the AP dimension. This includes the site of biopsy-proven DCIS anteriorly and suspicious enhancement extending to the level of the pectoralis muscle posteriorly. 4. Suspicious 7 mm enhancing mass in the far posterior slightly lateral left breast (image 138/212). 5. No MRI evidence of malignancy on the right. 6. No suspicious lymphadenopathy.     07/13/2019 Surgery   LEFT NIPPLE SPARING MASTECTOMY WITH SENTINEL LYMPH NODE BIOPSY and LEFT BREAST RECONSTRUCTION WITH PLACEMENT OF TISSUE EXPANDER AND ALLODERM by D.r Tsuie and Dr Iran Planas.    07/13/2019 Pathology Results   SURGICAL PATHOLOGY   FINAL MICROSCOPIC DIAGNOSIS:   A. LYMPH NODE, LEFT AXILLARY #1, SENTINEL, BIOPSY:  - One of one lymph nodes negative for carcinoma (0/1).   B. BREAST, LEFT, MASTECTOMY:  - Invasive ductal carcinoma, grade 3, spanning 3.4 cm.  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  - Invasive carcinoma is 0.2 cm from the posterior margin focally (not on  ink) and 0.4 cm from the  anterior margin focally (not on ink).  - In situ carcinoma is 0.4 cm from the posterior margin focally (not on  ink).  - Biopsy site x2.  - See oncology table.   C. NIPPLE, LEFT, BIOPSY:  - Benign nipple tissue.   D. LYMPH NODE, LEFT AXILLARY #2, SENTINEL, BIOPSY:  - One of one lymph nodes negative for carcinoma (0/1).     GROUP 1:  HER2 **POSITIVE**    07/13/2019 Oncotype testing   Ocotype score 56 with 39% risk of recurrence with Tmaoxifen or AI alone. There is a 15% benefit from adjuvant chemo.     07/13/2019 Cancer Staging   Staging form: Breast, AJCC 8th Edition - Pathologic stage from 07/13/2019: Stage IIA (pT2, pN0, cM0, G3, ER+, PR-, HER2+, Oncotype DX score: 56) - Signed by Truitt Merle, MD on 08/20/2019   08/25/2019 Echocardiogram    Baseline Echo  IMPRESSIONS     1. Left ventricular ejection fraction, by estimation, is 65 to 70%. The  left ventricle has normal function. The left ventricle has no regional  wall motion abnormalities. Left ventricular diastolic parameters were  normal.   2. Right ventricular systolic function is normal. The right ventricular  size is normal. There is normal pulmonary artery systolic pressure.   3. Left atrial size was mildly dilated.   4. The mitral valve is normal in structure and function. Trivial mitral  valve regurgitation.   5. The aortic valve is tricuspid. Aortic valve regurgitation is not  visualized.   6. The inferior vena cava is normal in size with greater than 50%  respiratory variability, suggesting right atrial pressure of 3 mmHg.   7. The aortic valve is not well seen. It is trileaflet. There appears to  be some calcification associated with the non-coronary cusp but this is  not well seen.    08/25/2019 Imaging   Whole body Bone Scan  IMPRESSION: No significant abnormality identified.   08/25/2019 Imaging   CT CAP w Contrast  IMPRESSION: 1. No findings of metastatic disease to the chest, abdomen, or pelvis. 2. 0.7 by 0.3 cm sclerotic lesion in the right seventh rib laterally is likely benign but merits surveillance. 3. Mild sclerosis along the  sacroiliac joints bilaterally, query mild chronic sacroiliitis.     09/01/2019 Procedure   INSERTION PORT-A-CATH WITH ULTRASOUND GUIDANCE by Dr. Georgette Dover   09/02/2019 -  Chemotherapy   TCH (Taxol, Carboplatin, Herceptin) q3weeks for 6 cycles starting 09/02/19, followed by anti-HER2 maintenance therapy with Herceptin q3weeks to complete 1 year of treatment.     Invasive ductal carcinoma of breast, female, left ([place])  07/13/2019 Initial Diagnosis   Invasive ductal carcinoma of breast, female, left ([name])     CURRENT THERAPY:  Cycle 6 day 1 of TCH  INTERVAL HISTORY: Victoria Barker 39 y.o. female returns for evaluation  prior to her final dose of Taxotere, Carboplatin and Herceptin.  She underwent a doppler a few weeks ago that was negative.  She has lower extremity swelling.  This is improved since her treatment dose was decreased last cycle.  It is still persistent and bothersome.    She had an echo on 12/13/2019 was normal.  Her EF was 60-65%.       Patient Active Problem List   Diagnosis Date Noted  . Port-A-Cath in place 12/16/2019  . Invasive ductal carcinoma of breast, female, left ([place]) 07/13/2019  . Genetic testing 05/28/2019  . Malignant neoplasm of upper-outer quadrant of left breast in female, estrogen receptor positive ([place]) 04/30/2019    is allergic to bactrim [sulfamethoxazole-trimethoprim].  MEDICAL HISTORY: Past Medical History:  Diagnosis Date  . Cancer ([place])    Left breast Ca  . GERD (gastroesophageal reflux disease)   . Scoliosis   . Sickle cell trait ([place])     SURGICAL HISTORY: Past Surgical History:  Procedure Laterality Date  . BREAST RECONSTRUCTION WITH PLACEMENT OF TISSUE EXPANDER AND ALLODERM Left 07/13/2019   Procedure: LEFT BREAST RECONSTRUCTION WITH PLACEMENT OF TISSUE EXPANDER AND ALLODERM;  Surgeon: Irene Limbo, MD;  Location: [facility];  Service: Plastics;  Laterality: Left;  . DEBRIDEMENT AND CLOSURE WOUND Left 07/30/2019   Procedure: DEBRIDEMENT LEFT MASTECTOMY FLAP;  Surgeon: Irene Limbo, MD;  Location: [facility];  Service: Plastics;  Laterality: Left;  . INDUCED ABORTION  2017  . NIPPLE SPARING MASTECTOMY WITH SENTINEL LYMPH NODE BIOPSY Left 07/13/2019   Procedure: LEFT NIPPLE SPARING MASTECTOMY WITH SENTINEL LYMPH NODE BIOPSY;  Surgeon: Donnie Mesa, MD;  Location: [facility];  Service: General;  Laterality: Left;  . PORTACATH PLACEMENT Right 09/01/2019   Procedure: INSERTION PORT-A-CATH WITH ULTRASOUND GUIDANCE;  Surgeon: Donnie Mesa, MD;  Location: [facility];  Service: General;  Laterality: Right;  . TISSUE EXPANDER  PLACEMENT Left 07/30/2019   Procedure: TISSUE EXPANSION LEFT CHEST;  Surgeon: Irene Limbo, MD;  Location: [facility];  Service: Plastics;  Laterality: Left;    SOCIAL HISTORY: Social History   Socioeconomic History  . Marital status: Single    Spouse name: Not on file  . Number of children: 2  . Years of education: Not on file  . Highest education level: Not on file  Occupational History  . Occupation: [employer]   Tobacco Use  . Smoking status: Former Smoker    Packs/day: 0.25    Years: 5.00    Pack years: 1.25  . Smokeless tobacco: Never Used  Vaping Use  . Vaping Use: Former  Substance and Sexual Activity  . Alcohol use: Not Currently  . Drug use: Yes    Types: Marijuana    Comment: last time yesterday (07-29-19)6  . Sexual activity: Not on file  Other Topics Concern  . Not on file  Social History Narrative  . Not on file   Social Determinants of Health   Financial Resource Strain:   . Difficulty of Paying Living Expenses:   Food Insecurity:   . Worried About Charity fundraiser in the Last Year:   . Arboriculturist in the Last Year:   Transportation Needs:   . Film/video editor (Medical):   Marland Kitchen Lack of Transportation (Non-Medical):   Physical Activity:   . Days of Exercise per Week:   . Minutes of Exercise per Session:   Stress:   . Feeling of Stress :   Social Connections:   . Frequency of Communication with Friends and Family:   . Frequency of Social Gatherings with Friends and Family:   . Attends Religious Services:   . Active Member of Clubs or Organizations:   . Attends Archivist Meetings:   Marland Kitchen Marital Status:   Intimate Partner Violence:   . Fear of Current or Ex-Partner:   . Emotionally Abused:   Marland Kitchen Physically Abused:   . Sexually Abused:     FAMILY HISTORY: Family History  Problem Relation Age of Onset  . Hypertension Mother   . Hypertension Maternal Grandmother     Review of Systems  Constitutional: Negative for  appetite change, chills, fatigue and unexpected weight change.  HENT:   Negative for hearing loss, lump/mass, mouth sores and trouble swallowing.   Eyes: Negative for eye problems and icterus.  Respiratory: Negative for chest tightness, cough and shortness of breath.   Cardiovascular: Positive for leg swelling. Negative for chest pain and palpitations.  Gastrointestinal: Negative for abdominal distention, abdominal pain, constipation, diarrhea, nausea and vomiting.  Endocrine: Negative for hot flashes.  Genitourinary: Negative for difficulty urinating.   Musculoskeletal: Negative for arthralgias.  Skin: Negative for itching and rash.  Neurological: Negative for dizziness, extremity weakness, headaches and numbness.  Hematological: Negative for adenopathy. Does not bruise/bleed easily.  Psychiatric/Behavioral: Negative for depression. The patient is not nervous/anxious.       PHYSICAL EXAMINATION  ECOG PERFORMANCE STATUS: 1 - Symptomatic but completely ambulatory  Vitals:   12/16/19 0934  BP: 127/75  Pulse: 97  Resp: 16  Temp: 98.5 F (36.9 C)  SpO2: 98%    Physical Exam Constitutional:      General: She is not in acute distress.    Appearance: Normal appearance. She is not toxic-appearing.  HENT:     Head: Normocephalic and atraumatic.  Eyes:     General: No scleral icterus. Cardiovascular:     Rate and Rhythm: Normal rate and regular rhythm.     Pulses: Normal pulses.     Heart sounds: Normal heart sounds.  Pulmonary:     Effort: Pulmonary effort is normal.     Breath sounds: Normal breath sounds.  Abdominal:     General: Abdomen is flat. Bowel sounds are normal.     Palpations: Abdomen is soft.  Musculoskeletal:        General: Swelling (right greater than left, but present bilaterally) present.     Cervical back: Normal range of motion.  Skin:    General: Skin is warm and dry.     Capillary Refill: Capillary refill takes less than 2 seconds.     Findings: No  rash.  Neurological:     Mental Status: She is alert.  Psychiatric:        Mood and Affect: Mood normal.        Behavior: Behavior normal.  LABORATORY DATA:  CBC    Component Value Date/Time   WBC 8.6 12/16/2019 0924   WBC 14.6 (H) 07/14/2019 0104   RBC 3.77 (L) 12/16/2019 0924   HGB 11.8 (L) 12/16/2019 0924   HCT 35.7 (L) 12/16/2019 0924   PLT 230 12/16/2019 0924   MCV 94.7 12/16/2019 0924   MCH 31.3 12/16/2019 0924   MCHC 33.1 12/16/2019 0924   RDW 15.2 12/16/2019 0924   LYMPHSABS 0.9 12/16/2019 0924   MONOABS 0.4 12/16/2019 0924   EOSABS 0.0 12/16/2019 0924   BASOSABS 0.0 12/16/2019 0924    CMP     Component Value Date/Time   NA 138 11/25/2019 0940   K 4.1 11/25/2019 0940   CL 104 11/25/2019 0940   CO2 25 11/25/2019 0940   GLUCOSE 107 (H) 11/25/2019 0940   BUN 9 11/25/2019 0940   CREATININE 0.68 11/25/2019 0940   CALCIUM 9.2 11/25/2019 0940   PROT 6.4 (L) 11/25/2019 0940   ALBUMIN 3.3 (L) 11/25/2019 0940   AST 15 11/25/2019 0940   ALT 14 11/25/2019 0940   ALKPHOS 65 11/25/2019 0940   BILITOT <0.2 (L) 11/25/2019 0940   GFRNONAA >60 11/25/2019 0940   GFRAA >60 11/25/2019 0940      ASSESSMENT and PLAN:   Malignant neoplasm of upper-outer quadrant of left breast in female, estrogen receptor positive ([place]) Victoria Barker is here today for evlauation of her estrogen and her 2 positive breast cancer.  She is completing her chemotherapy with [facility].    1. Breast Cancer: She will proceed with her final cycle of TCH.  She is tolerating this well and her labs are stable today.  She will continue on maintenance Herceptin every 3 weeks to total 1 year of therapy.    2. Lower extremity swelling.  She has undergone doppler which was negative, and her chemotherapy dose reduction was helpful.  Her echo yesterday was normal.  I recommended she limit her salt intake and I sent in a low dose of lasix for her to take for a couple of days.  We discussed foods high in potassium and  she will eat more of these on days she is taking the Lasix.  We discussed elevating her legs and wearing compression stockings.  She plans on doing this.  Victoria Barker will return in 3 weeks for labs, f/u with Dr. Burr Medico, and her Herceptin.  She knows to call for any questions that may arise between now and her next appointment.  We are happy to see her sooner if needed.    Total encounter time: 30 minutes*  Wilber Bihari, NP 12/16/19 10:14 AM Medical Oncology and Hematology [facility] [place], [place] [address] Tel. [phone]    Fax. [phone]  *Total Encounter Time as defined by the Centers for Medicare and Medicaid Services includes, in addition to the face-to-face time of a patient visit (documented in the note above) non-face-to-face time: obtaining and reviewing outside history, ordering and reviewing medications, tests or procedures, care coordination (communications with other health care professionals or caregivers) and documentation in the medical record.

## 2019-12-16 NOTE — Assessment & Plan Note (Signed)
Victoria Barker is here today for evlauation of her estrogen and her 2 positive breast cancer.  She is completing her chemotherapy with [facility].    1. Breast Cancer: She will proceed with her final cycle of TCH.  She is tolerating this well and her labs are stable today.  She will continue on maintenance Herceptin every 3 weeks to total 1 year of therapy.    2. Lower extremity swelling.  She has undergone doppler which was negative, and her chemotherapy dose reduction was helpful.  Her echo yesterday was normal.  I recommended she limit her salt intake and I sent in a low dose of lasix for her to take for a couple of days.  We discussed foods high in potassium and she will eat more of these on days she is taking the Lasix.  We discussed elevating her legs and wearing compression stockings.  She plans on doing this.  Victoria Barker will return in 3 weeks for labs, f/u with Dr. Burr Medico, and her Herceptin.  She knows to call for any questions that may arise between now and her next appointment.  We are happy to see her sooner if needed.

## 2019-12-17 ENCOUNTER — Telehealth: Payer: Self-pay | Admitting: Adult Health

## 2019-12-17 NOTE — Telephone Encounter (Signed)
No 6/25 los. No changes made to pt's schedule.  

## 2019-12-18 ENCOUNTER — Ambulatory Visit: Payer: BC Managed Care – PPO

## 2020-01-03 NOTE — Progress Notes (Signed)
[place]   [phone] [phone]   Clinic Follow up Note   Patient Care Team: [name], MD as PCP - General (Obstetrics and Gynecology) Mauro Kaufmann, RN as Oncology Nurse Navigator Rockwell Germany, RN as Oncology Nurse Navigator Donnie Mesa, MD as Consulting Physician (General Surgery) Truitt Merle, MD as Consulting Physician (Hematology) Eppie Gibson, MD as Attending Physician (Radiation Oncology)  Date of Service:  01/06/2020  CHIEF COMPLAINT: F/u of left breast cancer  SUMMARY OF ONCOLOGIC HISTORY: Oncology History Overview Note  Cancer Staging Malignant neoplasm of upper-outer quadrant of left breast in female, estrogen receptor positive ([place]) Staging form: Breast, AJCC 8th Edition - Clinical stage from 04/28/2019: Stage IIA (cT2, cN0, cM0, G2, ER+, PR-, HER2: Equivocal) - Signed by Truitt Merle, MD on 05/04/2019 - Pathologic stage from 07/13/2019: Stage IIA (pT2, pN0, cM0, G3, ER+, PR-, HER2+, Oncotype DX score: 56) - Signed by Truitt Merle, MD on 08/20/2019    Malignant neoplasm of upper-outer quadrant of left breast in female, estrogen receptor positive ([place])  04/21/2019 Mammogram   Diagnostic mammogram 04/21/19  IMPRESSION 1. findings highly suspicious for left breast cancer. There is a palpable irregular mass 4cm from nipple measuring 2.4x2.4x1.8cm in the 1:00 position axis of the left breast and pleomorphic  calcifications in the upper-outer quadrant as described above.  2. single left 0.9x0.5cm axillary lymph node with a mild/borderline thickened cortex.    04/28/2019 Cancer Staging   Staging form: Breast, AJCC 8th Edition - Clinical stage from 04/28/2019: Stage IIA (cT2, cN0, cM0, G2, ER+, PR-, HER2: Equivocal) - Signed by Truitt Merle, MD on 05/04/2019   04/28/2019 Initial Biopsy   Diagnosis 04/28/19 1. Breast, left, needle core biopsy, 1 o'clock position - INVASIVE DUCTAL CARCINOMA. - DUCTAL CARCINOMA IN SITU. - SEE  COMMENT. 2. Lymph node, needle/core biopsy, left axilla - THERE IS NO EVIDENCE OF CARCINOMA IN 1 OF 1 LYMPH NODE (0/1). 3. Breast, left, needle core biopsy, upper, outer quadrant - DUCTAL CARCINOMA IN SITU WITH CALCIFICATIONS. - SEE COMMENT.   04/28/2019 Receptors her2   1. PROGNOSTIC INDICATORS Results: HER2 negative  Estrogen Receptor: 80%, POSITIVE, STRONG STAINING INTENSITY Progesterone Receptor: 0%, NEGATIVE Proliferation Marker Ki67: 35%    04/30/2019 Initial Diagnosis   Malignant neoplasm of upper-outer quadrant of left breast in female, estrogen receptor positive ([name])    Genetic Testing   No pathogenic variants identified. 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 The final report date is 05/28/2019.  The STAT Breast cancer panel offered by [facility] includes sequencing and rearrangement analysis for the following 9 genes:  ATM, BRCA1, BRCA2, CDH1, CHEK2, PALB2, PTEN, STK11 and TP53.    The Common Hereditary Cancers Panel offered by [facility] includes sequencing and/or deletion duplication testing of the following 48 genes: APC, ATM, AXIN2, BARD1, BMPR1A, BRCA1, BRCA2, BRIP1, CDH1, CDKN2A (p14ARF), CDKN2A (p16INK4a), CKD4, CHEK2, CTNNA1, DICER1, EPCAM (Deletion/duplication testing only), GREM1 (promoter region deletion/duplication testing only), KIT, MEN1, MLH1, MSH2, MSH3, MSH6, MUTYH, NBN, NF1, NHTL1, PALB2, PDGFRA, PMS2, POLD1, POLE, PTEN, RAD50, RAD51C, RAD51D, RNF43, SDHB, SDHC, SDHD, SMAD4, SMARCA4. STK11, TP53, TSC1, TSC2, and VHL.  The following genes were evaluated for sequence changes only: SDHA and HOXB13 c.251G>A variant only.   05/11/2019 Breast MRI   IMPRESSION: 1. Enhancing mass and architectural distortion spanning approximately 6 x 3 x 5.3 cm in the upper outer left breast consistent with the patient's biopsy-proven site of invasive  cancer. 2. Biopsy changes in the anterior left breast at the  site of the patient's biopsy-proven ductal carcinoma in situ. 3. Suspicious changes within the upper-outer quadrant of the left breast spanning a total of 8 cm in the AP dimension. This includes the site of biopsy-proven DCIS anteriorly and suspicious enhancement extending to the level of the pectoralis muscle posteriorly. 4. Suspicious 7 mm enhancing mass in the far posterior slightly lateral left breast (image 138/212). 5. No MRI evidence of malignancy on the right. 6. No suspicious lymphadenopathy.     05/26/2019 -  Anti-estrogen oral therapy   Tamoxifen 92m daily starting 05/26/19. Stopped before surgery and chemo on 07/13/19. Restart in 12/2019.    07/13/2019 Surgery   LEFT NIPPLE SPARING MASTECTOMY WITH SENTINEL LYMPH NODE BIOPSY and LEFT BREAST RECONSTRUCTION WITH PLACEMENT OF TISSUE EXPANDER AND ALLODERM by D.r Tsuie and Dr TIran Planas    07/13/2019 Pathology Results   SURGICAL PATHOLOGY   FINAL MICROSCOPIC DIAGNOSIS:   A. LYMPH NODE, LEFT AXILLARY #1, SENTINEL, BIOPSY:  - One of one lymph nodes negative for carcinoma (0/1).   B. BREAST, LEFT, MASTECTOMY:  - Invasive ductal carcinoma, grade 3, spanning 3.4 cm.  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  - Invasive carcinoma is 0.2 cm from the posterior margin focally (not on  ink) and 0.4 cm from the  anterior margin focally (not on ink).  - In situ carcinoma is 0.4 cm from the posterior margin focally (not on  ink).  - Biopsy site x2.  - See oncology table.   C. NIPPLE, LEFT, BIOPSY:  - Benign nipple tissue.   D. LYMPH NODE, LEFT AXILLARY #2, SENTINEL, BIOPSY:  - One of one lymph nodes negative for carcinoma (0/1).     GROUP 1:  HER2 **POSITIVE**    07/13/2019 Oncotype testing   Ocotype score 56 with 39% risk of recurrence with Tmaoxifen or AI alone. There is a 15% benefit from adjuvant chemo.     07/13/2019 Cancer Staging   Staging form: Breast, AJCC 8th Edition - Pathologic stage from 07/13/2019:  Stage IIA (pT2, pN0, cM0, G3, ER+, PR-, HER2+, Oncotype DX score: 56) - Signed by FTruitt Merle MD on 08/20/2019   08/25/2019 Echocardiogram   Baseline Echo  IMPRESSIONS     1. Left ventricular ejection fraction, by estimation, is 65 to 70%. The  left ventricle has normal function. The left ventricle has no regional  wall motion abnormalities. Left ventricular diastolic parameters were  normal.   2. Right ventricular systolic function is normal. The right ventricular  size is normal. There is normal pulmonary artery systolic pressure.   3. Left atrial size was mildly dilated.   4. The mitral valve is normal in structure and function. Trivial mitral  valve regurgitation.   5. The aortic valve is tricuspid. Aortic valve regurgitation is not  visualized.   6. The inferior vena cava is normal in size with greater than 50%  respiratory variability, suggesting right atrial pressure of 3 mmHg.   7. The aortic valve is not well seen. It is trileaflet. There appears to  be some calcification associated with the non-coronary cusp but this is  not well seen.    08/25/2019 Imaging   Whole body Bone Scan  IMPRESSION: No significant abnormality identified.   08/25/2019 Imaging   CT CAP w Contrast  IMPRESSION: 1. No findings of metastatic disease to the chest, abdomen, or pelvis. 2. 0.7 by 0.3 cm sclerotic lesion in the right  seventh rib laterally is likely benign but merits surveillance. 3. Mild sclerosis along the sacroiliac joints bilaterally, query mild chronic sacroiliitis.     09/01/2019 Procedure   INSERTION PORT-A-CATH WITH ULTRASOUND GUIDANCE by Dr. Georgette Dover   09/02/2019 -  Chemotherapy   TCH (Taxol, Carboplatin, Herceptin) q3weeks for 6 cycles starting 09/02/19-12/16/19, followed by anti-HER2 maintenance therapy with Herceptin q3weeks starting 01/06/20 to complete 1 year of treatment (from 08/2019)     Invasive ductal carcinoma of breast, female, left ([place])  07/13/2019 Initial Diagnosis    Invasive ductal carcinoma of breast, female, left ([place])      CURRENT THERAPY:  -Tamoxifen '20mg'$  daily starting 05/26/19. Stopped before surgery and chemo on 07/13/19. Restart in 12/2019.  [facility] q3weeks for 6 cycles starting 09/02/19-12/16/19, followed by maintenance Herceptinstarting 01/06/20 to complete one year therapy (from 08/2019).    INTERVAL HISTORY:  Victoria Barker is here for a follow up and treatment. She presents to the clinic alone. She notes she is doing well. She notes her final cycle chemo went well with more nausea and hot flashes. She notes her hot flashes are waking her up at night and if in the sun can make her mildly dizzy. She is willing to take medication for her hot flashes. She requested a letter to return to work. She notes she has to work at least 30 days before requesting off for her breast reconstruction surgery. She plans to do surgery later in 2021.    REVIEW OF SYSTEMS:   Constitutional: Denies fevers, chills or abnormal weight loss (+) hot flashes  Eyes: Denies blurriness of vision Ears, nose, mouth, throat, and face: Denies mucositis or sore throat Respiratory: Denies cough, dyspnea or wheezes Cardiovascular: Denies palpitation, chest discomfort or lower extremity swelling Gastrointestinal:  Denies heartburn or change in bowel habits (+) nausea  Skin: Denies abnormal skin rashes Lymphatics: Denies new lymphadenopathy or easy bruising Neurological:Denies numbness, tingling or new weaknesses Behavioral/Psych: Mood is stable, no new changes  All other systems were reviewed with the patient and are negative.  MEDICAL HISTORY:  Past Medical History:  Diagnosis Date  . Cancer ([place])    Left breast Ca  . GERD (gastroesophageal reflux disease)   . Scoliosis   . Sickle cell trait ([place])     SURGICAL HISTORY: Past Surgical History:  Procedure Laterality Date  . BREAST RECONSTRUCTION WITH PLACEMENT OF TISSUE EXPANDER AND ALLODERM Left 07/13/2019   Procedure: LEFT  BREAST RECONSTRUCTION WITH PLACEMENT OF TISSUE EXPANDER AND ALLODERM;  Surgeon: Irene Limbo, MD;  Location: [place];  Service: Plastics;  Laterality: Left;  . DEBRIDEMENT AND CLOSURE WOUND Left 07/30/2019   Procedure: DEBRIDEMENT LEFT MASTECTOMY FLAP;  Surgeon: Irene Limbo, MD;  Location: [place];  Service: Plastics;  Laterality: Left;  . INDUCED ABORTION  2017  . NIPPLE SPARING MASTECTOMY WITH SENTINEL LYMPH NODE BIOPSY Left 07/13/2019   Procedure: LEFT NIPPLE SPARING MASTECTOMY WITH SENTINEL LYMPH NODE BIOPSY;  Surgeon: Donnie Mesa, MD;  Location: [place];  Service: General;  Laterality: Left;  . PORTACATH PLACEMENT Right 09/01/2019   Procedure: INSERTION PORT-A-CATH WITH ULTRASOUND GUIDANCE;  Surgeon: Donnie Mesa, MD;  Location: [place];  Service: General;  Laterality: Right;  . TISSUE EXPANDER PLACEMENT Left 07/30/2019   Procedure: TISSUE EXPANSION LEFT CHEST;  Surgeon: Irene Limbo, MD;  Location: [place];  Service: Plastics;  Laterality: Left;    I have reviewed the social history and family history with the patient and they are unchanged  from previous note.  ALLERGIES:  is allergic to bactrim [sulfamethoxazole-trimethoprim].  MEDICATIONS:  Current Outpatient Medications  Medication Sig Dispense Refill  . dexamethasone (DECADRON) 4 MG tablet     . dexlansoprazole (DEXILANT) 60 MG capsule Take 60 mg by mouth daily.    . furosemide (LASIX) 20 MG tablet Take 1 tablet (20 mg total) by mouth daily for 5 days. 5 tablet 0  . HYDROcodone-acetaminophen (NORCO/VICODIN) 5-325 MG tablet Take 1 tablet by mouth every 6 (six) hours as needed for moderate pain. 15 tablet 0  . Lactobacillus-Inulin (CULTURELLE DIGESTIVE HEALTH PO) Take 1 capsule by mouth daily as needed (digestive health.).     Marland Kitchen lidocaine-prilocaine (EMLA) cream Apply 1 application topically as needed. 30 g 0  . metroNIDAZOLE (METROGEL) 0.75 % vaginal gel Place 1.22  application vaginally daily.    . Multiple Vitamin (MULTIVITAMIN WITH MINERALS) TABS tablet Take 1 tablet by mouth 3 (three) times a week. WOMEN'S ONE-A-DAY    . ondansetron (ZOFRAN) 8 MG tablet Take 1 tablet (8 mg total) by mouth 2 (two) times daily as needed for refractory nausea / vomiting. Start on day 3 after chemo. 30 tablet 1  . prochlorperazine (COMPAZINE) 10 MG tablet Take 1 tablet (10 mg total) by mouth every 6 (six) hours as needed (Nausea or vomiting). 30 tablet 1  . tamoxifen (NOLVADEX) 20 MG tablet Take 1 tablet (20 mg total) by mouth daily. 30 tablet 3  . venlafaxine XR (EFFEXOR-XR) 37.5 MG 24 hr capsule Take 1 capsule (37.5 mg total) by mouth daily with breakfast. 30 capsule 2   No current facility-administered medications for this visit.    PHYSICAL EXAMINATION: ECOG PERFORMANCE STATUS: 1 - Symptomatic but completely ambulatory  Vitals:   01/06/20 1018  BP: 105/79  Pulse: 94  Resp: 17  Temp: 97.7 F (36.5 C)  SpO2: 100%   Filed Weights   01/06/20 1018  Weight: 143 lb 8 oz (65.1 kg)    Due to COVID19 we will limit examination to appearance. Patient had no complaints.  GENERAL:alert, no distress and comfortable SKIN: skin color normal, no rashes or significant lesions EYES: normal, Conjunctiva are pink and non-injected, sclera clear  NEURO: alert & oriented x 3 with fluent speech   LABORATORY DATA:  I have reviewed the data as listed CBC Latest Ref Rng & Units 01/06/2020 12/16/2019 11/25/2019  WBC 4.0 - 10.5 K/uL 5.0 8.6 10.6(H)  Hemoglobin 12.0 - 15.0 g/dL 12.1 11.8(L) 10.6(L)  Hematocrit 36 - 46 % 36.5 35.7(L) 31.6(L)  Platelets 150 - 400 K/uL 185 230 265     CMP Latest Ref Rng & Units 01/06/2020 12/16/2019 11/25/2019  Glucose 70 - 99 mg/dL 91 100(H) 107(H)  BUN 6 - 20 mg/dL '10 10 9  '$ Creatinine 0.44 - 1.00 mg/dL 0.79 0.73 0.68  Sodium 135 - 145 mmol/L 140 136 138  Potassium 3.5 - 5.1 mmol/L 3.8 4.1 4.1  Chloride 98 - 111 mmol/L 104 105 104  CO2 22 - 32  mmol/L '26 22 25  '$ Calcium 8.9 - 10.3 mg/dL 8.9 9.2 9.2  Total Protein 6.5 - 8.1 g/dL 6.5 6.6 6.4(L)  Total Bilirubin 0.3 - 1.2 mg/dL <0.2(L) 0.3 <0.2(L)  Alkaline Phos 38 - 126 U/L 58 57 65  AST 15 - 41 U/L '23 20 15  '$ ALT 0 - 44 U/L '17 19 14      '$ RADIOGRAPHIC STUDIES: I have personally reviewed the radiological images as listed and agreed with the findings in the report. No  results found.   ASSESSMENT & PLAN:  Lineth Thielke is a 39 y.o. female with    1.Malignant neoplasm of upper-outer quadrant of left breast,invasive ductal carcinoma and DCIS, stageIIA,pT2N0M0, ER+/PR-/HER2+, GradeIII -She was diagnosed in 04/2019. She was found to have2 left breast masses with DCIS and 1 mass with Invasive ductal carcinoma, her abnormal lymph node was biopsied and found to be negative. -Her invasive tumor was ER positive, PR negative, HER-2 equivocal by IHC, negative by [facility] initial biopsy -While she awaited surgery I started her on Tamoxifen on 05/26/19 for antiestrogen therapy given her ER positive disease. She took for about 1 month before stopping for surgery and chemo.  -On 07/13/19 she underwent left breast mastectomy with Dr. Prince Solian and reconstruction with Dr. Iran Planas. We discussed her path which showedcomplete surgical resection of her 3.4cmIDCwith clear margins and node negative. Tumor was found to be HER2 positiveon resected sample and Oncotype testing(ordered before HER2 came back positive)withrecurrence score of 56.This is consistent with aggressive nature of HER-2 positive breast cancer. -To reduce her high risk of recurrence she completed 6 cycles of adjuvant chemotherapywithTCH, 09/02/19-12/16/19. She will continue with maintenance Herceptinq3weeks starting 01/06/20 to complete one year therapy (from 08/2019). Her insurance denied Herceptin injections.  -Given node negative disease and mastectomy, Dr Isidore Moos did not recommend adjuvant radiation.  -She is clinically doing  well. She is recovering from chemo well with residual nausea and worsened hot flashes. I reviewed management with her.  -Labs reviewed, CBC and CMP WNL except albumin 3.4. Overall adequate to proceed with maintenance Herceptin today and continue every 3 weeks.  -She will continue to f/u with cardiologist. Her 11/2019 Echo was adequate.  -She plans to proceed with breast reconstruction surgery in later 2021.  -To reduce her risk of distant cancer recurrence, she will restart Tamoxifen.  I again reviewed the benefit and potential side effects, she voiced good understanding.  She has agreed and will start after she starts Effexor.  -F/u in 9 weeks.    2. B/l leg aches, LE ankle swelling  -She has had persistent b/l leg aches (5+/10) secondary to docetaxel. Dose was reduced for Cycle 5-6.  -resolved now   3. Nicotine use, Smoking Cessation  -She smoked cigarettes for 2 years, less than 1/2ppd. She currently smokes jewel pod containing nicotine for the past 3 years.  -Smoking cessationhas been reviewedwith her. She is willing to quit.   4. Hot Flashes, chemo-induced.  -She has had worsened hot flashes day and night which are effecting her sleep.  -I discussed the option of SSRI with Effexor or Gabapentin. I reviewed side effects of both. She is willing to try Effexor first.  -I discussed once her periods restart we can take her off Effexor. She can restart Tamoxifen after starting Effexor, given Tamoxifen can increase her hot flashes.   5. Genetic -her genetic testing was negative   PLAN: -Labs reviewed and adequate to proceed with Herceptin today  -I called in Effexor and refilled Tamoxifen, Restart Tamoxifen next month after starting Effexor for hot flash control.  -Herceptin every 3 weeks in 3, 6, 9 weeks  -Lab and F/u in 9 weeks  -I provided her letter for her to return to work next week.    No problem-specific Assessment & Plan notes found for this encounter.   No orders  of the defined types were placed in this encounter.  All questions were answered. The patient knows to call the clinic with any problems, questions or concerns.  No barriers to learning was detected. The total time spent in the appointment was 30 minutes.     Truitt Merle, MD 01/06/2020   I, Joslyn Devon, am acting as scribe for Truitt Merle, MD.   I have reviewed the above documentation for accuracy and completeness, and I agree with the above.

## 2020-01-06 ENCOUNTER — Inpatient Hospital Stay (HOSPITAL_BASED_OUTPATIENT_CLINIC_OR_DEPARTMENT_OTHER): Payer: BC Managed Care – PPO | Admitting: Hematology

## 2020-01-06 ENCOUNTER — Encounter: Payer: Self-pay | Admitting: Hematology

## 2020-01-06 ENCOUNTER — Inpatient Hospital Stay: Payer: BC Managed Care – PPO | Attending: Hematology

## 2020-01-06 ENCOUNTER — Inpatient Hospital Stay: Payer: BC Managed Care – PPO

## 2020-01-06 ENCOUNTER — Other Ambulatory Visit: Payer: Self-pay

## 2020-01-06 DIAGNOSIS — Z170 Estrogen receptor positive status [ER+]: Secondary | ICD-10-CM

## 2020-01-06 DIAGNOSIS — C50412 Malignant neoplasm of upper-outer quadrant of left female breast: Secondary | ICD-10-CM | POA: Insufficient documentation

## 2020-01-06 DIAGNOSIS — C50912 Malignant neoplasm of unspecified site of left female breast: Secondary | ICD-10-CM

## 2020-01-06 DIAGNOSIS — Z95828 Presence of other vascular implants and grafts: Secondary | ICD-10-CM

## 2020-01-06 DIAGNOSIS — Z5112 Encounter for antineoplastic immunotherapy: Secondary | ICD-10-CM | POA: Diagnosis not present

## 2020-01-06 LAB — CBC WITH DIFFERENTIAL (CANCER CENTER ONLY)
Abs Immature Granulocytes: 0.02 10*3/uL (ref 0.00–0.07)
Basophils Absolute: 0.1 10*3/uL (ref 0.0–0.1)
Basophils Relative: 1 %
Eosinophils Absolute: 0 10*3/uL (ref 0.0–0.5)
Eosinophils Relative: 0 %
HCT: 36.5 % (ref 36.0–46.0)
Hemoglobin: 12.1 g/dL (ref 12.0–15.0)
Immature Granulocytes: 0 %
Lymphocytes Relative: 56 %
Lymphs Abs: 2.8 10*3/uL (ref 0.7–4.0)
MCH: 30.7 pg (ref 26.0–34.0)
MCHC: 33.2 g/dL (ref 30.0–36.0)
MCV: 92.6 fL (ref 80.0–100.0)
Monocytes Absolute: 0.4 10*3/uL (ref 0.1–1.0)
Monocytes Relative: 7 %
Neutro Abs: 1.8 10*3/uL (ref 1.7–7.7)
Neutrophils Relative %: 36 %
Platelet Count: 185 10*3/uL (ref 150–400)
RBC: 3.94 MIL/uL (ref 3.87–5.11)
RDW: 13.9 % (ref 11.5–15.5)
WBC Count: 5 10*3/uL (ref 4.0–10.5)
nRBC: 0 % (ref 0.0–0.2)

## 2020-01-06 LAB — CMP (CANCER CENTER ONLY)
ALT: 17 U/L (ref 0–44)
AST: 23 U/L (ref 15–41)
Albumin: 3.4 g/dL — ABNORMAL LOW (ref 3.5–5.0)
Alkaline Phosphatase: 58 U/L (ref 38–126)
Anion gap: 10 (ref 5–15)
BUN: 10 mg/dL (ref 6–20)
CO2: 26 mmol/L (ref 22–32)
Calcium: 8.9 mg/dL (ref 8.9–10.3)
Chloride: 104 mmol/L (ref 98–111)
Creatinine: 0.79 mg/dL (ref 0.44–1.00)
GFR, Est AFR Am: >60 mL/min (ref >60)
GFR, Estimated: >60 mL/min (ref >60)
Glucose, Bld: 91 mg/dL (ref 70–99)
Potassium: 3.8 mmol/L (ref 3.5–5.1)
Sodium: 140 mmol/L (ref 135–145)
Total Bilirubin: <0.2 mg/dL — ABNORMAL LOW (ref 0.3–1.2)
Total Protein: 6.5 g/dL (ref 6.5–8.1)

## 2020-01-06 LAB — PREGNANCY, URINE: Preg Test, Ur: NEGATIVE

## 2020-01-06 MED ORDER — DIPHENHYDRAMINE HCL 25 MG PO CAPS
25.0000 mg | ORAL_CAPSULE | Freq: Once | ORAL | Status: AC
  Administered 2020-01-06: 25 mg via ORAL

## 2020-01-06 MED ORDER — TRASTUZUMAB-DKST CHEMO 150 MG IV SOLR
6.0000 mg/kg | Freq: Once | INTRAVENOUS | Status: AC
  Administered 2020-01-06: 357 mg via INTRAVENOUS
  Filled 2020-01-06: qty 17

## 2020-01-06 MED ORDER — SODIUM CHLORIDE 0.9 % IV SOLN
Freq: Once | INTRAVENOUS | Status: AC
  Filled 2020-01-06: qty 250

## 2020-01-06 MED ORDER — VENLAFAXINE HCL ER 37.5 MG PO CP24
37.5000 mg | ORAL_CAPSULE | Freq: Every day | ORAL | 2 refills | Status: DC
Start: 2020-01-06 — End: 2020-02-04

## 2020-01-06 MED ORDER — HEPARIN SOD (PORK) LOCK FLUSH 100 UNIT/ML IV SOLN
500.0000 [IU] | Freq: Once | INTRAVENOUS | Status: AC | PRN
  Administered 2020-01-06: 500 [IU]
  Filled 2020-01-06: qty 5

## 2020-01-06 MED ORDER — ONDANSETRON HCL 8 MG PO TABS: 8.0000 mg | ORAL_TABLET | Freq: Two times a day (BID) | ORAL | 1 refills | Status: DC | PRN

## 2020-01-06 MED ORDER — PROCHLORPERAZINE MALEATE 10 MG PO TABS
ORAL_TABLET | ORAL | Status: AC
  Filled 2020-01-06: qty 1

## 2020-01-06 MED ORDER — ACETAMINOPHEN 325 MG PO TABS
ORAL_TABLET | ORAL | Status: AC
  Filled 2020-01-06: qty 2

## 2020-01-06 MED ORDER — SODIUM CHLORIDE 0.9% FLUSH
10.0000 mL | INTRAVENOUS | Status: DC | PRN
  Administered 2020-01-06: 10 mL
  Filled 2020-01-06: qty 10

## 2020-01-06 MED ORDER — DIPHENHYDRAMINE HCL 25 MG PO CAPS
ORAL_CAPSULE | ORAL | Status: AC
  Filled 2020-01-06: qty 1

## 2020-01-06 MED ORDER — TAMOXIFEN CITRATE 20 MG PO TABS
20.0000 mg | ORAL_TABLET | Freq: Every day | ORAL | 3 refills | Status: DC
Start: 2020-01-06 — End: 2020-04-05

## 2020-01-06 MED ORDER — ACETAMINOPHEN 325 MG PO TABS
650.0000 mg | ORAL_TABLET | Freq: Once | ORAL | Status: AC
  Administered 2020-01-06: 650 mg via ORAL

## 2020-01-06 MED ORDER — PROCHLORPERAZINE MALEATE 10 MG PO TABS: 10.0000 mg | ORAL_TABLET | Freq: Four times a day (QID) | ORAL | 1 refills | Status: DC | PRN

## 2020-01-06 MED ORDER — SODIUM CHLORIDE 0.9% FLUSH
10.0000 mL | Freq: Once | INTRAVENOUS | Status: AC
  Administered 2020-01-06: 10 mL
  Filled 2020-01-06: qty 10

## 2020-01-06 MED ORDER — PROCHLORPERAZINE MALEATE 10 MG PO TABS
10.0000 mg | ORAL_TABLET | Freq: Once | ORAL | Status: AC
  Administered 2020-01-06: 10 mg via ORAL

## 2020-01-06 NOTE — Patient Instructions (Signed)
[facility] Discharge Instructions for Patients Receiving Chemotherapy  Today you received the following chemotherapy agents trastuzumab.  To help prevent nausea and vomiting after your treatment, we encourage you to take your nausea medication as directed.    If you develop nausea and vomiting that is not controlled by your nausea medication, call the clinic.   BELOW ARE SYMPTOMS THAT SHOULD BE REPORTED IMMEDIATELY:  *FEVER GREATER THAN 100.5 F  *CHILLS WITH OR WITHOUT FEVER  NAUSEA AND VOMITING THAT IS NOT CONTROLLED WITH YOUR NAUSEA MEDICATION  *UNUSUAL SHORTNESS OF BREATH  *UNUSUAL BRUISING OR BLEEDING  TENDERNESS IN MOUTH AND THROAT WITH OR WITHOUT PRESENCE OF ULCERS  *URINARY PROBLEMS  *BOWEL PROBLEMS  UNUSUAL RASH Items with * indicate a potential emergency and should be followed up as soon as possible.  Feel free to call the clinic should you have any questions or concerns. The clinic phone number is [phone].  Please show the CHEMO ALERT CARD at check-in to the Emergency Department and triage nurse.   

## 2020-01-07 ENCOUNTER — Telehealth: Payer: Self-pay | Admitting: Hematology

## 2020-01-07 NOTE — Telephone Encounter (Signed)
Scheduled per 7/15 los. Pt is aware of appts on 8/5, 8/26,  and 9/16. Pt requested earliest time. Pt has work at 76.

## 2020-01-13 ENCOUNTER — Encounter: Payer: Self-pay | Admitting: *Deleted

## 2020-01-27 ENCOUNTER — Other Ambulatory Visit: Payer: Self-pay

## 2020-01-27 ENCOUNTER — Inpatient Hospital Stay: Payer: BC Managed Care – PPO | Attending: Hematology

## 2020-01-27 ENCOUNTER — Ambulatory Visit: Payer: BC Managed Care – PPO

## 2020-01-27 VITALS — BP 113/80 | HR 86 | Temp 98.5°F | Resp 16

## 2020-01-27 DIAGNOSIS — C50912 Malignant neoplasm of unspecified site of left female breast: Secondary | ICD-10-CM

## 2020-01-27 DIAGNOSIS — Z5112 Encounter for antineoplastic immunotherapy: Secondary | ICD-10-CM | POA: Diagnosis not present

## 2020-01-27 DIAGNOSIS — C50412 Malignant neoplasm of upper-outer quadrant of left female breast: Secondary | ICD-10-CM | POA: Diagnosis present

## 2020-01-27 MED ORDER — ACETAMINOPHEN 325 MG PO TABS
650.0000 mg | ORAL_TABLET | Freq: Once | ORAL | Status: AC
  Administered 2020-01-27: 650 mg via ORAL

## 2020-01-27 MED ORDER — DIPHENHYDRAMINE HCL 25 MG PO CAPS
ORAL_CAPSULE | ORAL | Status: AC
  Filled 2020-01-27: qty 1

## 2020-01-27 MED ORDER — PROCHLORPERAZINE MALEATE 10 MG PO TABS
10.0000 mg | ORAL_TABLET | Freq: Once | ORAL | Status: AC
  Administered 2020-01-27: 10 mg via ORAL

## 2020-01-27 MED ORDER — DIPHENHYDRAMINE HCL 25 MG PO CAPS
25.0000 mg | ORAL_CAPSULE | Freq: Once | ORAL | Status: AC
  Administered 2020-01-27: 25 mg via ORAL

## 2020-01-27 MED ORDER — SODIUM CHLORIDE 0.9 % IV SOLN
Freq: Once | INTRAVENOUS | Status: AC
  Filled 2020-01-27: qty 250

## 2020-01-27 MED ORDER — ACETAMINOPHEN 325 MG PO TABS
ORAL_TABLET | ORAL | Status: AC
  Filled 2020-01-27: qty 2

## 2020-01-27 MED ORDER — HEPARIN SOD (PORK) LOCK FLUSH 100 UNIT/ML IV SOLN
500.0000 [IU] | Freq: Once | INTRAVENOUS | Status: AC | PRN
  Administered 2020-01-27: 500 [IU]
  Filled 2020-01-27: qty 5

## 2020-01-27 MED ORDER — SODIUM CHLORIDE 0.9% FLUSH
10.0000 mL | INTRAVENOUS | Status: DC | PRN
  Administered 2020-01-27: 10 mL
  Filled 2020-01-27: qty 10

## 2020-01-27 MED ORDER — PROCHLORPERAZINE MALEATE 10 MG PO TABS
ORAL_TABLET | ORAL | Status: AC
  Filled 2020-01-27: qty 1

## 2020-01-27 MED ORDER — TRASTUZUMAB-DKST CHEMO 150 MG IV SOLR
6.0000 mg/kg | Freq: Once | INTRAVENOUS | Status: AC
  Administered 2020-01-27: 357 mg via INTRAVENOUS
  Filled 2020-01-27: qty 17

## 2020-01-27 NOTE — Patient Instructions (Signed)
[facility] Discharge Instructions for Patients Receiving Chemotherapy  Today you received the following chemotherapy agents trastuzumab.  To help prevent nausea and vomiting after your treatment, we encourage you to take your nausea medication as directed.    If you develop nausea and vomiting that is not controlled by your nausea medication, call the clinic.   BELOW ARE SYMPTOMS THAT SHOULD BE REPORTED IMMEDIATELY:  *FEVER GREATER THAN 100.5 F  *CHILLS WITH OR WITHOUT FEVER  NAUSEA AND VOMITING THAT IS NOT CONTROLLED WITH YOUR NAUSEA MEDICATION  *UNUSUAL SHORTNESS OF BREATH  *UNUSUAL BRUISING OR BLEEDING  TENDERNESS IN MOUTH AND THROAT WITH OR WITHOUT PRESENCE OF ULCERS  *URINARY PROBLEMS  *BOWEL PROBLEMS  UNUSUAL RASH Items with * indicate a potential emergency and should be followed up as soon as possible.  Feel free to call the clinic should you have any questions or concerns. The clinic phone number is [phone].  Please show the CHEMO ALERT CARD at check-in to the Emergency Department and triage nurse.   

## 2020-02-03 ENCOUNTER — Other Ambulatory Visit: Payer: Self-pay | Admitting: Hematology

## 2020-02-08 ENCOUNTER — Telehealth: Payer: Self-pay | Admitting: Hematology

## 2020-02-08 NOTE — Telephone Encounter (Signed)
Rescheduled appointments per 8/17 scheduling message. Patient is aware of updated appointment times.

## 2020-02-17 ENCOUNTER — Inpatient Hospital Stay: Payer: BC Managed Care – PPO

## 2020-02-17 ENCOUNTER — Other Ambulatory Visit: Payer: Self-pay

## 2020-02-17 ENCOUNTER — Ambulatory Visit: Payer: BC Managed Care – PPO

## 2020-02-17 VITALS — BP 116/86 | HR 88 | Temp 98.9°F | Resp 18 | Wt 138.5 lb

## 2020-02-17 DIAGNOSIS — C50912 Malignant neoplasm of unspecified site of left female breast: Secondary | ICD-10-CM

## 2020-02-17 DIAGNOSIS — Z5112 Encounter for antineoplastic immunotherapy: Secondary | ICD-10-CM | POA: Diagnosis not present

## 2020-02-17 MED ORDER — SODIUM CHLORIDE 0.9 % IV SOLN
Freq: Once | INTRAVENOUS | Status: AC
  Filled 2020-02-17: qty 250

## 2020-02-17 MED ORDER — SODIUM CHLORIDE 0.9% FLUSH
10.0000 mL | INTRAVENOUS | Status: DC | PRN
  Administered 2020-02-17: 10 mL
  Filled 2020-02-17: qty 10

## 2020-02-17 MED ORDER — DIPHENHYDRAMINE HCL 25 MG PO CAPS
25.0000 mg | ORAL_CAPSULE | Freq: Once | ORAL | Status: AC
  Administered 2020-02-17: 25 mg via ORAL

## 2020-02-17 MED ORDER — PROCHLORPERAZINE MALEATE 10 MG PO TABS
ORAL_TABLET | ORAL | Status: AC
  Filled 2020-02-17: qty 1

## 2020-02-17 MED ORDER — PROCHLORPERAZINE MALEATE 10 MG PO TABS
10.0000 mg | ORAL_TABLET | Freq: Once | ORAL | Status: AC
  Administered 2020-02-17: 10 mg via ORAL

## 2020-02-17 MED ORDER — HEPARIN SOD (PORK) LOCK FLUSH 100 UNIT/ML IV SOLN
500.0000 [IU] | Freq: Once | INTRAVENOUS | Status: AC | PRN
  Administered 2020-02-17: 500 [IU]
  Filled 2020-02-17: qty 5

## 2020-02-17 MED ORDER — ACETAMINOPHEN 325 MG PO TABS
ORAL_TABLET | ORAL | Status: AC
  Filled 2020-02-17: qty 2

## 2020-02-17 MED ORDER — TRASTUZUMAB-DKST CHEMO 150 MG IV SOLR
6.0000 mg/kg | Freq: Once | INTRAVENOUS | Status: AC
  Administered 2020-02-17: 357 mg via INTRAVENOUS
  Filled 2020-02-17: qty 17

## 2020-02-17 MED ORDER — DIPHENHYDRAMINE HCL 25 MG PO CAPS
ORAL_CAPSULE | ORAL | Status: AC
  Filled 2020-02-17: qty 1

## 2020-02-17 MED ORDER — ACETAMINOPHEN 325 MG PO TABS
650.0000 mg | ORAL_TABLET | Freq: Once | ORAL | Status: AC
  Administered 2020-02-17: 650 mg via ORAL

## 2020-02-17 NOTE — Patient Instructions (Signed)
[facility] Discharge Instructions for Patients Receiving Chemotherapy  Today you received the following chemotherapy agents trastuzumab.  To help prevent nausea and vomiting after your treatment, we encourage you to take your nausea medication as directed.    If you develop nausea and vomiting that is not controlled by your nausea medication, call the clinic.   BELOW ARE SYMPTOMS THAT SHOULD BE REPORTED IMMEDIATELY:  *FEVER GREATER THAN 100.5 F  *CHILLS WITH OR WITHOUT FEVER  NAUSEA AND VOMITING THAT IS NOT CONTROLLED WITH YOUR NAUSEA MEDICATION  *UNUSUAL SHORTNESS OF BREATH  *UNUSUAL BRUISING OR BLEEDING  TENDERNESS IN MOUTH AND THROAT WITH OR WITHOUT PRESENCE OF ULCERS  *URINARY PROBLEMS  *BOWEL PROBLEMS  UNUSUAL RASH Items with * indicate a potential emergency and should be followed up as soon as possible.  Feel free to call the clinic should you have any questions or concerns. The clinic phone number is [phone].  Please show the CHEMO ALERT CARD at check-in to the Emergency Department and triage nurse.   

## 2020-03-06 NOTE — Progress Notes (Signed)
[place]   [phone] [phone]   Clinic Follow up Note   Patient Care Team: [name], MD as PCP - General (Obstetrics and Gynecology) Mauro Kaufmann, RN as Oncology Nurse Navigator Rockwell Germany, RN as Oncology Nurse Navigator Donnie Mesa, MD as Consulting Physician (General Surgery) Truitt Merle, MD as Consulting Physician (Hematology) Eppie Gibson, MD as Attending Physician (Radiation Oncology)  Date of Service:  03/09/2020  CHIEF COMPLAINT:  F/u of left breast cancer  SUMMARY OF ONCOLOGIC HISTORY: Oncology History Overview Note  Cancer Staging Malignant neoplasm of upper-outer quadrant of left breast in female, estrogen receptor positive ([place]) Staging form: Breast, AJCC 8th Edition - Clinical stage from 04/28/2019: Stage IIA (cT2, cN0, cM0, G2, ER+, PR-, HER2: Equivocal) - Signed by Truitt Merle, MD on 05/04/2019 - Pathologic stage from 07/13/2019: Stage IIA (pT2, pN0, cM0, G3, ER+, PR-, HER2+, Oncotype DX score: 56) - Signed by Truitt Merle, MD on 08/20/2019    Malignant neoplasm of upper-outer quadrant of left breast in female, estrogen receptor positive ([place])  04/21/2019 Mammogram   Diagnostic mammogram 04/21/19  IMPRESSION 1. findings highly suspicious for left breast cancer. There is a palpable irregular mass 4cm from nipple measuring 2.4x2.4x1.8cm in the 1:00 position axis of the left breast and pleomorphic  calcifications in the upper-outer quadrant as described above.  2. single left 0.9x0.5cm axillary lymph node with a mild/borderline thickened cortex.    04/28/2019 Cancer Staging   Staging form: Breast, AJCC 8th Edition - Clinical stage from 04/28/2019: Stage IIA (cT2, cN0, cM0, G2, ER+, PR-, HER2: Equivocal) - Signed by Truitt Merle, MD on 05/04/2019   04/28/2019 Initial Biopsy   Diagnosis 04/28/19 1. Breast, left, needle core biopsy, 1 o'clock position - INVASIVE DUCTAL CARCINOMA. - DUCTAL CARCINOMA IN SITU. - SEE  COMMENT. 2. Lymph node, needle/core biopsy, left axilla - THERE IS NO EVIDENCE OF CARCINOMA IN 1 OF 1 LYMPH NODE (0/1). 3. Breast, left, needle core biopsy, upper, outer quadrant - DUCTAL CARCINOMA IN SITU WITH CALCIFICATIONS. - SEE COMMENT.   04/28/2019 Receptors her2   1. PROGNOSTIC INDICATORS Results: HER2 negative  Estrogen Receptor: 80%, POSITIVE, STRONG STAINING INTENSITY Progesterone Receptor: 0%, NEGATIVE Proliferation Marker Ki67: 35%    04/30/2019 Initial Diagnosis   Malignant neoplasm of upper-outer quadrant of left breast in female, estrogen receptor positive ([name])    Genetic Testing   No pathogenic variants identified. 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 The final report date is 05/28/2019.  The STAT Breast cancer panel offered by [facility] includes sequencing and rearrangement analysis for the following 9 genes:  ATM, BRCA1, BRCA2, CDH1, CHEK2, PALB2, PTEN, STK11 and TP53.    The Common Hereditary Cancers Panel offered by [facility] includes sequencing and/or deletion duplication testing of the following 48 genes: APC, ATM, AXIN2, BARD1, BMPR1A, BRCA1, BRCA2, BRIP1, CDH1, CDKN2A (p14ARF), CDKN2A (p16INK4a), CKD4, CHEK2, CTNNA1, DICER1, EPCAM (Deletion/duplication testing only), GREM1 (promoter region deletion/duplication testing only), KIT, MEN1, MLH1, MSH2, MSH3, MSH6, MUTYH, NBN, NF1, NHTL1, PALB2, PDGFRA, PMS2, POLD1, POLE, PTEN, RAD50, RAD51C, RAD51D, RNF43, SDHB, SDHC, SDHD, SMAD4, SMARCA4. STK11, TP53, TSC1, TSC2, and VHL.  The following genes were evaluated for sequence changes only: SDHA and HOXB13 c.251G>A variant only.   05/11/2019 Breast MRI   IMPRESSION: 1. Enhancing mass and architectural distortion spanning approximately 6 x 3 x 5.3 cm in the upper outer left breast consistent with the patient's biopsy-proven site of  invasive cancer. 2. Biopsy changes in the anterior left breast at the  site of the patient's biopsy-proven ductal carcinoma in situ. 3. Suspicious changes within the upper-outer quadrant of the left breast spanning a total of 8 cm in the AP dimension. This includes the site of biopsy-proven DCIS anteriorly and suspicious enhancement extending to the level of the pectoralis muscle posteriorly. 4. Suspicious 7 mm enhancing mass in the far posterior slightly lateral left breast (image 138/212). 5. No MRI evidence of malignancy on the right. 6. No suspicious lymphadenopathy.     05/26/2019 -  Anti-estrogen oral therapy   Tamoxifen 33m daily starting 05/26/19. Stopped before surgery and chemo on 07/13/19. Restart in 12/2019.    07/13/2019 Surgery   LEFT NIPPLE SPARING MASTECTOMY WITH SENTINEL LYMPH NODE BIOPSY and LEFT BREAST RECONSTRUCTION WITH PLACEMENT OF TISSUE EXPANDER AND ALLODERM by D.r Tsuie and Dr TIran Planas    07/13/2019 Pathology Results   SURGICAL PATHOLOGY   FINAL MICROSCOPIC DIAGNOSIS:   A. LYMPH NODE, LEFT AXILLARY #1, SENTINEL, BIOPSY:  - One of one lymph nodes negative for carcinoma (0/1).   B. BREAST, LEFT, MASTECTOMY:  - Invasive ductal carcinoma, grade 3, spanning 3.4 cm.  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  - Invasive carcinoma is 0.2 cm from the posterior margin focally (not on  ink) and 0.4 cm from the  anterior margin focally (not on ink).  - In situ carcinoma is 0.4 cm from the posterior margin focally (not on  ink).  - Biopsy site x2.  - See oncology table.   C. NIPPLE, LEFT, BIOPSY:  - Benign nipple tissue.   D. LYMPH NODE, LEFT AXILLARY #2, SENTINEL, BIOPSY:  - One of one lymph nodes negative for carcinoma (0/1).     GROUP 1:  HER2 **POSITIVE**    07/13/2019 Oncotype testing   Ocotype score 56 with 39% risk of recurrence with Tmaoxifen or AI alone. There is a 15% benefit from adjuvant chemo.     07/13/2019 Cancer Staging   Staging form: Breast, AJCC 8th Edition - Pathologic stage from 07/13/2019:  Stage IIA (pT2, pN0, cM0, G3, ER+, PR-, HER2+, Oncotype DX score: 56) - Signed by FTruitt Merle MD on 08/20/2019   08/25/2019 Echocardiogram   Baseline Echo  IMPRESSIONS     1. Left ventricular ejection fraction, by estimation, is 65 to 70%. The  left ventricle has normal function. The left ventricle has no regional  wall motion abnormalities. Left ventricular diastolic parameters were  normal.   2. Right ventricular systolic function is normal. The right ventricular  size is normal. There is normal pulmonary artery systolic pressure.   3. Left atrial size was mildly dilated.   4. The mitral valve is normal in structure and function. Trivial mitral  valve regurgitation.   5. The aortic valve is tricuspid. Aortic valve regurgitation is not  visualized.   6. The inferior vena cava is normal in size with greater than 50%  respiratory variability, suggesting right atrial pressure of 3 mmHg.   7. The aortic valve is not well seen. It is trileaflet. There appears to  be some calcification associated with the non-coronary cusp but this is  not well seen.    08/25/2019 Imaging   Whole body Bone Scan  IMPRESSION: No significant abnormality identified.   08/25/2019 Imaging   CT CAP w Contrast  IMPRESSION: 1. No findings of metastatic disease to the chest, abdomen, or pelvis. 2. 0.7 by 0.3 cm sclerotic lesion in the  right seventh rib laterally is likely benign but merits surveillance. 3. Mild sclerosis along the sacroiliac joints bilaterally, query mild chronic sacroiliitis.     09/01/2019 Procedure   INSERTION PORT-A-CATH WITH ULTRASOUND GUIDANCE by Dr. Georgette Dover   09/02/2019 -  Chemotherapy   TCH (Taxol, Carboplatin, Herceptin) q3weeks for 6 cycles starting 09/02/19-12/16/19, followed by anti-HER2 maintenance therapy with Herceptin q3weeks starting 01/06/20 to complete 1 year of treatment (from 08/2019)     Invasive ductal carcinoma of breast, female, left ([place])  07/13/2019 Initial Diagnosis    Invasive ductal carcinoma of breast, female, left ([place])      CURRENT THERAPY:  -Tamoxifen 78m daily starting 05/26/19. Stopped before surgery and chemo on 07/13/19. Restart in 12/2019.  [facility]-12/16/19, followed by maintenance Herceptinstarting 01/06/20 to complete one year therapy (from 08/2019).   INTERVAL HISTORY:  KAdelaine Roppolois here for a follow up. She presents to the clinic alone.  She is clinically doing well.  She has been on Effexor for the past 2 months, blood pressure has improved.  She had 1 episode of fleeting careless last week, not depressed, and she did not take Effexor for 3 to 4 days.  She did restart this week tolerating well so far.  She is tolerating tamoxifen well, no joint pain or other new symptoms.  Her hair has grown back well, no other significant side effects from previous chemo.  She has good appetite and energy level, review of system otherwise negative.   MEDICAL HISTORY:  Past Medical History:  Diagnosis Date  . Cancer ([place]    Left breast Ca  . GERD (gastroesophageal reflux disease)   . Scoliosis   . Sickle cell trait ([place]     SURGICAL HISTORY: Past Surgical History:  Procedure Laterality Date  . BREAST RECONSTRUCTION WITH PLACEMENT OF TISSUE EXPANDER AND ALLODERM Left 07/13/2019   Procedure: LEFT BREAST RECONSTRUCTION WITH PLACEMENT OF TISSUE EXPANDER AND ALLODERM;  Surgeon: TIrene Limbo MD;  [place] [facility]  Service: Plastics;  Laterality: Left;  . DEBRIDEMENT AND CLOSURE WOUND Left 07/30/2019   Procedure: DEBRIDEMENT LEFT MASTECTOMY FLAP;  Surgeon: TIrene Limbo MD;  [facility]  Service: Plastics;  Laterality: Left;  . INDUCED ABORTION  2017  . NIPPLE SPARING MASTECTOMY WITH SENTINEL LYMPH NODE BIOPSY Left 07/13/2019   Procedure: LEFT NIPPLE SPARING MASTECTOMY WITH SENTINEL LYMPH NODE BIOPSY;  Surgeon: TDonnie Mesa MD;  [place] [facility]  Service: General;  Laterality: Left;   . PORTACATH PLACEMENT Right 09/01/2019   Procedure: INSERTION PORT-A-CATH WITH ULTRASOUND GUIDANCE;  Surgeon: TDonnie Mesa MD;  [place] [facility]  Service: General;  Laterality: Right;  . TISSUE EXPANDER PLACEMENT Left 07/30/2019   Procedure: TISSUE EXPANSION LEFT CHEST;  Surgeon: TIrene Limbo MD;  [place] [facility]  Service: Plastics;  Laterality: Left;    I have reviewed the social history and family history with the patient and they are unchanged from previous note.  ALLERGIES:  is allergic to bactrim [sulfamethoxazole-trimethoprim].  MEDICATIONS:  Current Outpatient Medications  Medication Sig Dispense Refill  . dexamethasone (DECADRON) 4 MG tablet     . dexlansoprazole (DEXILANT) 60 MG capsule Take 60 mg by mouth daily.    . furosemide (LASIX) 20 MG tablet Take 1 tablet (20 mg total) by mouth daily for 5 days. 5 tablet 0  . HYDROcodone-acetaminophen (NORCO/VICODIN) 5-325 MG tablet Take 1 tablet by mouth every 6 (six) hours as needed for moderate pain. 15  tablet 0  . Lactobacillus-Inulin (CULTURELLE DIGESTIVE HEALTH PO) Take 1 capsule by mouth daily as needed (digestive health.).     Marland Kitchen lidocaine-prilocaine (EMLA) cream Apply 1 application topically as needed. 30 g 0  . metroNIDAZOLE (METROGEL) 0.75 % vaginal gel Place 2.99 application vaginally daily.    . Multiple Vitamin (MULTIVITAMIN WITH MINERALS) TABS tablet Take 1 tablet by mouth 3 (three) times a week. WOMEN'S ONE-A-DAY    . ondansetron (ZOFRAN) 8 MG tablet Take 1 tablet (8 mg total) by mouth 2 (two) times daily as needed for refractory nausea / vomiting. Start on day 3 after chemo. 30 tablet 1  . prochlorperazine (COMPAZINE) 10 MG tablet Take 1 tablet (10 mg total) by mouth every 6 (six) hours as needed (Nausea or vomiting). 30 tablet 1  . tamoxifen (NOLVADEX) 20 MG tablet Take 1 tablet (20 mg total) by mouth daily. 30 tablet 3  . venlafaxine XR (EFFEXOR-XR) 37.5 MG 24 hr capsule Take  1 capsule (37.5 mg total) by mouth daily with breakfast. 30 capsule 3   No current facility-administered medications for this visit.   Facility-Administered Medications Ordered in Other Visits  Medication Dose Route Frequency Provider Last Rate Last Admin  . sodium chloride flush (NS) 0.9 % injection 10 mL  10 mL Intracatheter PRN Truitt Merle, MD   10 mL at 03/09/20 1048    PHYSICAL EXAMINATION: ECOG PERFORMANCE STATUS: 0 - Asymptomatic  Vitals:   03/09/20 0843  BP: 121/88  Pulse: 82  Resp: 18  Temp: (!) 97.4 F (36.3 C)  SpO2: 100%   Filed Weights   03/09/20 0843  Weight: 138 lb 12.8 oz (63 kg)    GENERAL:alert, no distress and comfortable SKIN: skin color, texture, turgor are normal, no rashes or significant lesions EYES: normal, Conjunctiva are pink and non-injected, sclera clear NECK: supple, thyroid normal size, non-tender, without nodularity LYMPH:  no palpable lymphadenopathy in the cervical, axillary  LUNGS: clear to auscultation and percussion with normal breathing effort HEART: regular rate & rhythm and no murmurs and no lower extremity edema ABDOMEN:abdomen soft, non-tender and normal bowel sounds Musculoskeletal:no cyanosis of digits and no clubbing  NEURO: alert & oriented x 3 with fluent speech, no focal motor/sensory deficits Breasts: Breast inspection showed status post left mastectomy with tissue expander, incision has healed well.  Palpation of the right breast and axilla revealed no obvious mass that I could appreciate.   LABORATORY DATA:  I have reviewed the data as listed CBC Latest Ref Rng & Units 03/09/2020 01/06/2020 12/16/2019  WBC 4.0 - 10.5 K/uL 5.1 5.0 8.6  Hemoglobin 12.0 - 15.0 g/dL 12.6 12.1 11.8(L)  Hematocrit 36 - 46 % 37.1 36.5 35.7(L)  Platelets 150 - 400 K/uL 191 185 230     CMP Latest Ref Rng & Units 03/09/2020 01/06/2020 12/16/2019  Glucose 70 - 99 mg/dL 86 91 100(H)  BUN 6 - 20 mg/dL 12 10 10   Creatinine 0.44 - 1.00 mg/dL 0.79 0.79  0.73  Sodium 135 - 145 mmol/L 140 140 136  Potassium 3.5 - 5.1 mmol/L 4.0 3.8 4.1  Chloride 98 - 111 mmol/L 107 104 105  CO2 22 - 32 mmol/L 28 26 22   Calcium 8.9 - 10.3 mg/dL 8.8(L) 8.9 9.2  Total Protein 6.5 - 8.1 g/dL 6.8 6.5 6.6  Total Bilirubin 0.3 - 1.2 mg/dL 0.4 <0.2(L) 0.3  Alkaline Phos 38 - 126 U/L 69 58 57  AST 15 - 41 U/L 16 23 20   ALT 0 -  44 U/L 10 17 19       RADIOGRAPHIC STUDIES: I have personally reviewed the radiological images as listed and agreed with the findings in the report. No results found.   ASSESSMENT & PLAN:  Victoria Barker is a 39 y.o. female with     No problem-specific Assessment & Plan notes found for this encounter.  1.Malignant neoplasm of upper-outer quadrant of left breast,invasive ductal carcinoma and DCIS, stageIIA,pT2N0M0, ER+/PR-/HER2+, GradeIII -She was diagnosed in 04/2019. She was found to have2 left breast masses with DCIS and 1 mass with Invasive ductal carcinoma, her abnormal lymph node was biopsied and found to be negative. -On 07/13/19 she underwent left breast mastectomy with Dr. Prince Solian and reconstruction with Dr. Iran Planas.Tumorwas found to be HER2 positiveon resected sample and Oncotype testing(ordered before HER2 came back positive)withrecurrence score of 56. -To reduce her high risk of recurrence she completed 6 cycles of adjuvant chemotherapy with TCH (09/02/19-12/16/19). She will continue with maintenance Herceptinq3weeks starting 01/06/20 to complete one year therapy until 08/2020 -Given node negative disease and mastectomy, Dr Isidore Moos did not recommend adjuvant radiation.  -She restarted Tamoxifen in 12/2019.  She is tolerating better now, much less hot flushes after she started Effexor -We discussed ovarian suppression today.  She does not plan to have more children.  I encouraged her to consider bilateral oophorectomy, and switch tamoxifen to aromatase inhibitor, which will likely reduce her risk of future breast cancer  recurrence.  Benefit and side effects discussed with her, she will think about it, and discuss with her gynecologist on her next visit next month. -We discussed ovarian suppression with Zoladex injection before BSO  -Continue trastuzumab infusion every 3, I will see her back in 9 weeks -She plans to have left breast implant placement in early November, postop tamoxifen 2 weeks before surgery to reduce her risk of thrombosis.  Will likely restart her tamoxifen when I see her back in 9 weeks    2. Nicotine use, Smoking Cessation  -She smoked cigarettes for 2 years, less than 1/2ppd. She currently smokes jewel pod containing nicotine for the past 3 years.  -Smoking cessationhas been reviewedwith her. She is willing to quit.   3. Hot Flashes, chemo-induced -She has had worsened hot flashes day and night which are effecting her sleep.  -I started her on Effexor in 12/2019. Her hot flushes has much improved. -Will watch on Tamoxifen.   4. Genetic testing negative for pathogenetic mutations.    PLAN: -Labs reviewed and adequate to proceed with trastuzumab today and continue every 3 weeks   -Continue tamoxifen and Effexor -Stop tamoxifen 2 weeks before her left breast reconstruction surgery in early November -Lab and follow-up in 9 weeks -Right breast screening mammogram in late October -Echocardiogram in 2-3 weeks    Orders Placed This Encounter  Procedures  . MM Digital Screening Unilat R    Standing Status:   Future    Standing Expiration Date:   03/09/2021    Order Specific Question:   Reason for Exam (SYMPTOM  OR DIAGNOSIS REQUIRED)    Answer:   screening    Order Specific Question:   Is the patient pregnant?    Answer:   No    Order Specific Question:   Preferred imaging location?    Answer:   [facility]  . ECHOCARDIOGRAM COMPLETE    Standing Status:   Future    Standing Expiration Date:   03/09/2021    Order Specific Question:   Where should this test  be  performed    Answer:   [place]    Order Specific Question:   Perflutren DEFINITY (image enhancing agent) should be administered unless hypersensitivity or allergy exist    Answer:   Administer Perflutren    Order Specific Question:   Is a special reader required? (athlete or structural heart)    Answer:   No    Order Specific Question:   Does this study need to be read by the Structural team/Level 3 readers?    Answer:   No    Order Specific Question:   Reason for exam-Echo    Answer:   Chemo  V67.2 / Z09   All questions were answered. The patient knows to call the clinic with any problems, questions or concerns. No barriers to learning was detected. The total time spent in the appointment was 30 minutes.     Truitt Merle, MD 03/09/2020   I, Joslyn Devon, am acting as scribe for Truitt Merle, MD.   I have reviewed the above documentation for accuracy and completeness, and I agree with the above.

## 2020-03-09 ENCOUNTER — Encounter: Payer: Self-pay | Admitting: Hematology

## 2020-03-09 ENCOUNTER — Ambulatory Visit: Payer: BC Managed Care – PPO

## 2020-03-09 ENCOUNTER — Other Ambulatory Visit: Payer: BC Managed Care – PPO

## 2020-03-09 ENCOUNTER — Other Ambulatory Visit: Payer: Self-pay

## 2020-03-09 ENCOUNTER — Inpatient Hospital Stay: Payer: BC Managed Care – PPO | Attending: Hematology

## 2020-03-09 ENCOUNTER — Inpatient Hospital Stay (HOSPITAL_BASED_OUTPATIENT_CLINIC_OR_DEPARTMENT_OTHER): Payer: BC Managed Care – PPO | Admitting: Hematology

## 2020-03-09 ENCOUNTER — Telehealth: Payer: Self-pay | Admitting: Hematology

## 2020-03-09 ENCOUNTER — Inpatient Hospital Stay: Payer: BC Managed Care – PPO

## 2020-03-09 ENCOUNTER — Ambulatory Visit: Payer: BC Managed Care – PPO | Admitting: Hematology

## 2020-03-09 ENCOUNTER — Encounter: Payer: Self-pay | Admitting: *Deleted

## 2020-03-09 VITALS — BP 121/88 | HR 82 | Temp 97.4°F | Resp 18 | Ht 62.0 in | Wt 138.8 lb

## 2020-03-09 DIAGNOSIS — C50412 Malignant neoplasm of upper-outer quadrant of left female breast: Secondary | ICD-10-CM

## 2020-03-09 DIAGNOSIS — C50912 Malignant neoplasm of unspecified site of left female breast: Secondary | ICD-10-CM

## 2020-03-09 DIAGNOSIS — Z170 Estrogen receptor positive status [ER+]: Secondary | ICD-10-CM

## 2020-03-09 DIAGNOSIS — Z5112 Encounter for antineoplastic immunotherapy: Secondary | ICD-10-CM | POA: Diagnosis not present

## 2020-03-09 LAB — CBC WITH DIFFERENTIAL (CANCER CENTER ONLY)
Abs Immature Granulocytes: 0.01 10*3/uL (ref 0.00–0.07)
Basophils Absolute: 0 10*3/uL (ref 0.0–0.1)
Basophils Relative: 1 %
Eosinophils Absolute: 0.1 10*3/uL (ref 0.0–0.5)
Eosinophils Relative: 2 %
HCT: 37.1 % (ref 36.0–46.0)
Hemoglobin: 12.6 g/dL (ref 12.0–15.0)
Immature Granulocytes: 0 %
Lymphocytes Relative: 46 %
Lymphs Abs: 2.4 10*3/uL (ref 0.7–4.0)
MCH: 30.3 pg (ref 26.0–34.0)
MCHC: 34 g/dL (ref 30.0–36.0)
MCV: 89.2 fL (ref 80.0–100.0)
Monocytes Absolute: 0.3 10*3/uL (ref 0.1–1.0)
Monocytes Relative: 5 %
Neutro Abs: 2.3 10*3/uL (ref 1.7–7.7)
Neutrophils Relative %: 46 %
Platelet Count: 191 10*3/uL (ref 150–400)
RBC: 4.16 MIL/uL (ref 3.87–5.11)
RDW: 12.9 % (ref 11.5–15.5)
WBC Count: 5.1 10*3/uL (ref 4.0–10.5)
nRBC: 0 % (ref 0.0–0.2)

## 2020-03-09 LAB — CMP (CANCER CENTER ONLY)
ALT: 10 U/L (ref 0–44)
AST: 16 U/L (ref 15–41)
Albumin: 3.7 g/dL (ref 3.5–5.0)
Alkaline Phosphatase: 69 U/L (ref 38–126)
Anion gap: 5 (ref 5–15)
BUN: 12 mg/dL (ref 6–20)
CO2: 28 mmol/L (ref 22–32)
Calcium: 8.8 mg/dL — ABNORMAL LOW (ref 8.9–10.3)
Chloride: 107 mmol/L (ref 98–111)
Creatinine: 0.79 mg/dL (ref 0.44–1.00)
GFR, Est AFR Am: >60 mL/min (ref >60)
GFR, Estimated: >60 mL/min (ref >60)
Glucose, Bld: 86 mg/dL (ref 70–99)
Potassium: 4 mmol/L (ref 3.5–5.1)
Sodium: 140 mmol/L (ref 135–145)
Total Bilirubin: 0.4 mg/dL (ref 0.3–1.2)
Total Protein: 6.8 g/dL (ref 6.5–8.1)

## 2020-03-09 LAB — PREGNANCY, URINE: Preg Test, Ur: NEGATIVE

## 2020-03-09 MED ORDER — ACETAMINOPHEN 325 MG PO TABS
650.0000 mg | ORAL_TABLET | Freq: Once | ORAL | Status: AC
  Administered 2020-03-09: 650 mg via ORAL

## 2020-03-09 MED ORDER — VENLAFAXINE HCL ER 37.5 MG PO CP24: 37.5000 mg | ORAL_CAPSULE | Freq: Every day | ORAL | 3 refills | Status: DC

## 2020-03-09 MED ORDER — PROCHLORPERAZINE MALEATE 10 MG PO TABS
10.0000 mg | ORAL_TABLET | Freq: Once | ORAL | Status: AC
  Administered 2020-03-09: 10 mg via ORAL

## 2020-03-09 MED ORDER — DIPHENHYDRAMINE HCL 25 MG PO CAPS
ORAL_CAPSULE | ORAL | Status: AC
  Filled 2020-03-09: qty 1

## 2020-03-09 MED ORDER — SODIUM CHLORIDE 0.9% FLUSH
10.0000 mL | INTRAVENOUS | Status: DC | PRN
  Administered 2020-03-09: 10 mL
  Filled 2020-03-09: qty 10

## 2020-03-09 MED ORDER — DIPHENHYDRAMINE HCL 25 MG PO CAPS
25.0000 mg | ORAL_CAPSULE | Freq: Once | ORAL | Status: AC
  Administered 2020-03-09: 25 mg via ORAL

## 2020-03-09 MED ORDER — HEPARIN SOD (PORK) LOCK FLUSH 100 UNIT/ML IV SOLN
500.0000 [IU] | Freq: Once | INTRAVENOUS | Status: AC | PRN
  Administered 2020-03-09: 500 [IU]
  Filled 2020-03-09: qty 5

## 2020-03-09 MED ORDER — SODIUM CHLORIDE 0.9 % IV SOLN
Freq: Once | INTRAVENOUS | Status: AC
  Filled 2020-03-09: qty 250

## 2020-03-09 MED ORDER — TRASTUZUMAB-DKST CHEMO 150 MG IV SOLR
6.0000 mg/kg | Freq: Once | INTRAVENOUS | Status: AC
  Administered 2020-03-09: 357 mg via INTRAVENOUS
  Filled 2020-03-09: qty 17

## 2020-03-09 MED ORDER — PROCHLORPERAZINE MALEATE 10 MG PO TABS
ORAL_TABLET | ORAL | Status: AC
  Filled 2020-03-09: qty 1

## 2020-03-09 MED ORDER — ACETAMINOPHEN 325 MG PO TABS
ORAL_TABLET | ORAL | Status: AC
  Filled 2020-03-09: qty 2

## 2020-03-09 NOTE — Telephone Encounter (Signed)
Scheduled per 9/16 los. Messaged RN Geni Bers to give pt appt calendar during treatment.

## 2020-03-09 NOTE — Patient Instructions (Signed)
[facility] Discharge Instructions for Patients Receiving Chemotherapy  Today you received the following chemotherapy agents: Ogivri   To help prevent nausea and vomiting after your treatment, we encourage you to take your nausea medication as directed.    If you develop nausea and vomiting that is not controlled by your nausea medication, call the clinic.   BELOW ARE SYMPTOMS THAT SHOULD BE REPORTED IMMEDIATELY:  *FEVER GREATER THAN 100.5 F  *CHILLS WITH OR WITHOUT FEVER  NAUSEA AND VOMITING THAT IS NOT CONTROLLED WITH YOUR NAUSEA MEDICATION  *UNUSUAL SHORTNESS OF BREATH  *UNUSUAL BRUISING OR BLEEDING  TENDERNESS IN MOUTH AND THROAT WITH OR WITHOUT PRESENCE OF ULCERS  *URINARY PROBLEMS  *BOWEL PROBLEMS  UNUSUAL RASH Items with * indicate a potential emergency and should be followed up as soon as possible.  Feel free to call the clinic should you have any questions or concerns. The clinic phone number is [phone].  Please show the CHEMO ALERT CARD at check-in to the Emergency Department and triage nurse.   

## 2020-03-23 ENCOUNTER — Ambulatory Visit (HOSPITAL_COMMUNITY)
Admission: RE | Admit: 2020-03-23 | Discharge: 2020-03-23 | Disposition: A | Discharge status: Home / Self Care | Payer: BC Managed Care – PPO | Source: Ambulatory Visit | Attending: Hematology | Admitting: Hematology

## 2020-03-23 ENCOUNTER — Other Ambulatory Visit: Payer: Self-pay

## 2020-03-23 DIAGNOSIS — Z0189 Encounter for other specified special examinations: Secondary | ICD-10-CM | POA: Diagnosis not present

## 2020-03-23 DIAGNOSIS — Z170 Estrogen receptor positive status [ER+]: Secondary | ICD-10-CM | POA: Diagnosis not present

## 2020-03-23 DIAGNOSIS — C50412 Malignant neoplasm of upper-outer quadrant of left female breast: Secondary | ICD-10-CM | POA: Diagnosis not present

## 2020-03-23 LAB — ECHOCARDIOGRAM COMPLETE
Area-P 1/2: 2.6 cm2
S' Lateral: 2.7 cm

## 2020-03-23 NOTE — Progress Notes (Signed)
  Echocardiogram 2D Echocardiogram has been performed.  Jennette Dubin 03/23/2020, 12:44 PM

## 2020-03-28 ENCOUNTER — Ambulatory Visit (HOSPITAL_COMMUNITY): Payer: BC Managed Care – PPO | Admitting: Cardiology

## 2020-03-30 ENCOUNTER — Inpatient Hospital Stay: Payer: BC Managed Care – PPO | Attending: Hematology

## 2020-03-30 ENCOUNTER — Other Ambulatory Visit: Payer: Self-pay

## 2020-03-30 VITALS — BP 114/79 | HR 94 | Temp 99.0°F | Resp 18 | Wt 134.0 lb

## 2020-03-30 DIAGNOSIS — Z5112 Encounter for antineoplastic immunotherapy: Secondary | ICD-10-CM | POA: Diagnosis not present

## 2020-03-30 DIAGNOSIS — C50912 Malignant neoplasm of unspecified site of left female breast: Secondary | ICD-10-CM

## 2020-03-30 DIAGNOSIS — C50412 Malignant neoplasm of upper-outer quadrant of left female breast: Secondary | ICD-10-CM | POA: Diagnosis present

## 2020-03-30 MED ORDER — ACETAMINOPHEN 325 MG PO TABS
650.0000 mg | ORAL_TABLET | Freq: Once | ORAL | Status: AC
  Administered 2020-03-30: 650 mg via ORAL

## 2020-03-30 MED ORDER — DIPHENHYDRAMINE HCL 25 MG PO CAPS
ORAL_CAPSULE | ORAL | Status: AC
  Filled 2020-03-30: qty 1

## 2020-03-30 MED ORDER — PROCHLORPERAZINE MALEATE 10 MG PO TABS
ORAL_TABLET | ORAL | Status: AC
  Filled 2020-03-30: qty 1

## 2020-03-30 MED ORDER — HEPARIN SOD (PORK) LOCK FLUSH 100 UNIT/ML IV SOLN
500.0000 [IU] | Freq: Once | INTRAVENOUS | Status: AC | PRN
  Administered 2020-03-30: 500 [IU]
  Filled 2020-03-30: qty 5

## 2020-03-30 MED ORDER — PROCHLORPERAZINE MALEATE 10 MG PO TABS
10.0000 mg | ORAL_TABLET | Freq: Once | ORAL | Status: AC
  Administered 2020-03-30: 10 mg via ORAL

## 2020-03-30 MED ORDER — SODIUM CHLORIDE 0.9 % IV SOLN
Freq: Once | INTRAVENOUS | Status: AC
  Filled 2020-03-30: qty 250

## 2020-03-30 MED ORDER — SODIUM CHLORIDE 0.9% FLUSH
10.0000 mL | INTRAVENOUS | Status: DC | PRN
  Administered 2020-03-30: 10 mL
  Filled 2020-03-30: qty 10

## 2020-03-30 MED ORDER — ACETAMINOPHEN 325 MG PO TABS
ORAL_TABLET | ORAL | Status: AC
  Filled 2020-03-30: qty 2

## 2020-03-30 MED ORDER — TRASTUZUMAB-DKST CHEMO 150 MG IV SOLR
6.0000 mg/kg | Freq: Once | INTRAVENOUS | Status: AC
  Administered 2020-03-30: 357 mg via INTRAVENOUS
  Filled 2020-03-30: qty 17

## 2020-03-30 MED ORDER — DIPHENHYDRAMINE HCL 25 MG PO CAPS
25.0000 mg | ORAL_CAPSULE | Freq: Once | ORAL | Status: AC
  Administered 2020-03-30: 25 mg via ORAL

## 2020-03-30 NOTE — Patient Instructions (Signed)
[facility] Discharge Instructions for Patients Receiving Chemotherapy  Today you received the following chemotherapy agents trastuzumab.  To help prevent nausea and vomiting after your treatment, we encourage you to take your nausea medication as directed.    If you develop nausea and vomiting that is not controlled by your nausea medication, call the clinic.   BELOW ARE SYMPTOMS THAT SHOULD BE REPORTED IMMEDIATELY:  *FEVER GREATER THAN 100.5 F  *CHILLS WITH OR WITHOUT FEVER  NAUSEA AND VOMITING THAT IS NOT CONTROLLED WITH YOUR NAUSEA MEDICATION  *UNUSUAL SHORTNESS OF BREATH  *UNUSUAL BRUISING OR BLEEDING  TENDERNESS IN MOUTH AND THROAT WITH OR WITHOUT PRESENCE OF ULCERS  *URINARY PROBLEMS  *BOWEL PROBLEMS  UNUSUAL RASH Items with * indicate a potential emergency and should be followed up as soon as possible.  Feel free to call the clinic should you have any questions or concerns. The clinic phone number is [phone].  Please show the CHEMO ALERT CARD at check-in to the Emergency Department and triage nurse.   

## 2020-04-05 ENCOUNTER — Other Ambulatory Visit: Payer: Self-pay | Admitting: Hematology

## 2020-04-06 NOTE — H&P (Signed)
  Subjective:     Patient ID: Victoria Barker is a 39 y.o. female.  HPI  9 months post op left NSM with immediate expander reconstruction. Course complicated by mastectomy flap necrosis and underwent debridement, closure. Shceuled for next stage breast reconstruction next month.  Presented with palpable left breast mass. Diagnositic MMG/US showed mass at 1 o clock, 4 cmfn, 2.4 x 2.4 x 1.8 cm and pleomorphic calcifications in the upper-outer quadrant. A single enlarged LN noted. 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 Biopsy LN benign.  MRI showed biopsy-proven site of invasive cancer 3.3 x 2.9 x 3.3 cm with surrounding NME overall area measures up to 6.0 x 3 x 5.3 cm. NME to the level of the pectoralis muscle. Biopsy clip in the UOQ at site of biopsy-proven DCIS is > 8 cm from clip to the posterior edge of the enhancing mass/NME. An additional 7 x 5 x 5 mm mass is identified in posterior, lateral left breast, abuting the pectoralis. No abnormal appearing lymph nodes. No additional biopsy completed as plan for mastectomy.   Final pathology 3.4 cm IDC with high grade DCIS, margins clear, 0/2 SLN.  Genetics noted VUS in [place]. Oncotype 56. Patient completed adjuvant chemotherapy 6.24.21. She will receive Herceptin through 08/2020. On tamoxifen.  MMG RIGHT last 04/20/2020  Prior 34 C. Left mastectomy 355 g  Stopped vaping prior to surgery.  Works for [employer] in [place]. Lives with 2 kids, ages 71 and 4.  Review of Systems     Objective:   Physical Exam  Cardiovascular: Normal rate, regular rhythm and normal heart sounds.  Pulmonary/Chest: Effort normal and breath sounds normal.   Chest:  Right breast no masses grade 3 ptosis Asymmetry NAC diameter with left smaller SN to nipple R 27 L 23 cm BW R 19  Nipple to IMF R 9 cm L 7 cm Rippling present Upper pole left No evidence seroma   Abd soft  sufficient soft tissue for lipofilling donor site    Assessment:     Left breast ca UOQ ER+ S/p left NSM, SLN, prepectoral TE/ADM (Alloderm) reconstruction Skin flap necrosis s/p debridement/closure Adjuvant chemotherapy    Plan:     MMG scheduled 10.28.21  Dr. Burr Medico recommend holding tamoxifen 2 week prior to surgery date- reviewed this with patient.  Plan removal left TE and placement implant, right mastopexy, possible lipofilling left chest.  Happy with right breast volume.   Over right reviewed anchor type scar possible drain goal preserving breast volume though some tissue will be excised as part of procedure.  Reviewed saline vs silicone. Recommend HCG or capacity filled silicone implants as they may offer reduced risk visible rippling. Reviewed MRI or USsurveillance for rupture with silicone implants.Reviewed size in part guided by width chest, cannot assure her cup size.Patient has elected for silicone, plan smooth round.  Reviewed possible fat grafting. Reviewed purpose of this to thicken flaps limit visible ripplingand aid in contour. Reviewed donor site pain, need for compression, variable take graft, fat necrosis that presents as masses and may require work up.Plan abdominal donor site.  Patient completed ASPS consent for implant placement following tissue expansion including physician patient checklist.   Rx for oxycodone and doxycycline given   Natrelle 133S FV-11-T 300 ml tissue expander placed,  fill volume 220 ml saline.

## 2020-04-13 ENCOUNTER — Encounter (HOSPITAL_COMMUNITY): Payer: Self-pay | Admitting: Cardiology

## 2020-04-13 ENCOUNTER — Other Ambulatory Visit: Payer: Self-pay

## 2020-04-13 ENCOUNTER — Ambulatory Visit (HOSPITAL_COMMUNITY)
Admission: RE | Admit: 2020-04-13 | Discharge: 2020-04-13 | Disposition: A | Discharge status: Home / Self Care | Payer: BC Managed Care – PPO | Source: Ambulatory Visit | Attending: Cardiology | Admitting: Cardiology

## 2020-04-13 VITALS — BP 118/70 | HR 89 | Wt 138.2 lb

## 2020-04-13 DIAGNOSIS — Z87891 Personal history of nicotine dependence: Secondary | ICD-10-CM | POA: Insufficient documentation

## 2020-04-13 DIAGNOSIS — Z9012 Acquired absence of left breast and nipple: Secondary | ICD-10-CM | POA: Diagnosis not present

## 2020-04-13 DIAGNOSIS — C50412 Malignant neoplasm of upper-outer quadrant of left female breast: Secondary | ICD-10-CM | POA: Diagnosis not present

## 2020-04-13 DIAGNOSIS — Z08 Encounter for follow-up examination after completed treatment for malignant neoplasm: Secondary | ICD-10-CM | POA: Diagnosis present

## 2020-04-13 DIAGNOSIS — Z853 Personal history of malignant neoplasm of breast: Secondary | ICD-10-CM | POA: Diagnosis not present

## 2020-04-13 DIAGNOSIS — Z170 Estrogen receptor positive status [ER+]: Secondary | ICD-10-CM

## 2020-04-13 DIAGNOSIS — Z79899 Other long term (current) drug therapy: Secondary | ICD-10-CM | POA: Insufficient documentation

## 2020-04-13 NOTE — Patient Instructions (Signed)
Your physician recommends that you schedule a follow-up appointment in: December with an echocardiogram  If you have any questions or concerns before your next appointment please send [place] a message through [place] or call our office at [phone].    TO LEAVE A MESSAGE FOR THE NURSE SELECT OPTION 2, PLEASE LEAVE A MESSAGE INCLUDING:  YOUR NAME  DATE OF BIRTH  CALL BACK NUMBER  REASON FOR CALL**this is important as we prioritize the call backs  YOU WILL RECEIVE A CALL BACK THE SAME DAY AS LONG AS YOU CALL BEFORE 4:00 PM

## 2020-04-14 NOTE — Progress Notes (Signed)
Oncology: Dr. Burr Medico  39 y.o. with history of breast cancer was referred by Dr. Burr Medico for cardio-oncology evaluation. Cancer was diagnosed in 11/20, EF+/PR-/HER2+.  She had left mastectomy in 1/21.  Chemotherapy started in 3/21 with taxol, carboplatin, and Herceptin x 6 cycles then Herceptin alone to complete a year.   Echo was done today and reviewed, EF 60-65% with normal strain.   Patient has no cardiac history.  She is a nonsmoker.  No significant exertional dyspnea or chest pain.  No palpitations, no history of syncope.   ECG (personally reviewed): NSR, normal.   PMH: 1. Breast cancer: Diagnosed in 11/20, EF+/PR-/HER2+.  She had left mastectomy in 1/21.  Chemotherapy started in 3/21 with taxol, carboplatin, and Herceptin x 6 cycles then Herceptin alone to complete a year.  - Echo (9/21): EF 60-65%, GLS -20.6%, normal RV.   Social History   Socioeconomic History  . Marital status: Single    Spouse name: Not on file  . Number of children: 2  . Years of education: Not on file  . Highest education level: Not on file  Occupational History  . Occupation: [employer]   Tobacco Use  . Smoking status: Former Smoker    Packs/day: 0.25    Years: 5.00    Pack years: 1.25  . Smokeless tobacco: Never Used  Vaping Use  . Vaping Use: Former  Substance and Sexual Activity  . Alcohol use: Not Currently  . Drug use: Yes    Types: Marijuana    Comment: last time yesterday (07-29-19)6  . Sexual activity: Not on file  Other Topics Concern  . Not on file  Social History Narrative  . Not on file   Social Determinants of Health   Financial Resource Strain:   . Difficulty of Paying Living Expenses: Not on file  Food Insecurity:   . Worried About Charity fundraiser in the Last Year: Not on file  . Ran Out of Food in the Last Year: Not on file  Transportation Needs:   . Lack of Transportation (Medical): Not on file  . Lack of Transportation (Non-Medical): Not on file  Physical Activity:   . Days  of Exercise per Week: Not on file  . Minutes of Exercise per Session: Not on file  Stress:   . Feeling of Stress : Not on file  Social Connections:   . Frequency of Communication with Friends and Family: Not on file  . Frequency of Social Gatherings with Friends and Family: Not on file  . Attends Religious Services: Not on file  . Active Member of Clubs or Organizations: Not on file  . Attends Archivist Meetings: Not on file  . Marital Status: Not on file  Intimate Partner Violence:   . Fear of Current or Ex-Partner: Not on file  . Emotionally Abused: Not on file  . Physically Abused: Not on file  . Sexually Abused: Not on file   Family History  Problem Relation Age of Onset  . Hypertension Mother   . Hypertension Maternal Grandmother    ROS: All systems reviewed and negative except as per HPI.   Current Outpatient Medications  Medication Sig Dispense Refill  . dexlansoprazole (DEXILANT) 60 MG capsule Take 60 mg by mouth daily.    Marland Kitchen HYDROcodone-acetaminophen (NORCO/VICODIN) 5-325 MG tablet Take 1 tablet by mouth every 6 (six) hours as needed for moderate pain. 15 tablet 0  . Lactobacillus-Inulin (CULTURELLE DIGESTIVE HEALTH PO) Take 1 capsule by mouth daily as  needed (digestive health.).     Marland Kitchen lidocaine-prilocaine (EMLA) cream Apply 1 application topically as needed. 30 g 0  . Multiple Vitamin (MULTIVITAMIN WITH MINERALS) TABS tablet Take 1 tablet by mouth 3 (three) times a week. WOMEN'S ONE-A-DAY    . ondansetron (ZOFRAN) 8 MG tablet Take 1 tablet (8 mg total) by mouth 2 (two) times daily as needed for refractory nausea / vomiting. Start on day 3 after chemo. 30 tablet 1  . prochlorperazine (COMPAZINE) 10 MG tablet Take 1 tablet (10 mg total) by mouth every 6 (six) hours as needed (Nausea or vomiting). 30 tablet 1  . tamoxifen (NOLVADEX) 20 MG tablet TAKE 1 TABLET BY MOUTH EVERY DAY 90 tablet 1  . venlafaxine XR (EFFEXOR-XR) 37.5 MG 24 hr capsule Take 1 capsule (37.5 mg  total) by mouth daily with breakfast. 30 capsule 3   No current facility-administered medications for this encounter.   BP 118/70   Pulse 89   Wt 62.7 kg (138 lb 3.2 oz)   SpO2 99%   BMI 25.28 kg/m  General: NAD Neck: No JVD, no thyromegaly or thyroid nodule.  Lungs: Clear to auscultation bilaterally with normal respiratory effort. CV: Nondisplaced PMI.  Heart regular S1/S2, no S3/S4, no murmur.  No peripheral edema.  No carotid bruit.  Normal pedal pulses.  Abdomen: Soft, nontender, no hepatosplenomegaly, no distention.  Skin: Intact without lesions or rashes.  Neurologic: Alert and oriented x 3.  Psych: Normal affect. Extremities: No clubbing or cyanosis.  HEENT: Normal.   Assessment/Plan: 1. Breast cancer: Patient will be getting Herceptin until 3/22. We discussed the risk of cardio-toxicity with Herceptin and the steps that we take to mitigate the risk.  I reviewed today's echo, normal LV EF and strain.   She will continue on Herceptin as planned.  - Repeat echo + office followup in 3 months.   Loralie Champagne 04/14/2020

## 2020-04-17 ENCOUNTER — Telehealth: Payer: Self-pay

## 2020-04-17 ENCOUNTER — Encounter: Payer: Self-pay | Admitting: Licensed Clinical Social Worker

## 2020-04-17 NOTE — Progress Notes (Signed)
[facility] CSW Progress Note  Clinical Education officer, museum received e-mail from patient stating that she will be having reconstructive surgery and will be out of work for 6 weeks. Patient inquired re: any grants that may assist during this time.   Patient has previously received assistance from [employer], [employer], and [place]. Unfortunately, as reconstruction is not considered active treatment, there are limited funds available. Informed patient about [employer] and [employer].    Edwinna Areola Arrie Zuercher , LCSW

## 2020-04-17 NOTE — Telephone Encounter (Signed)
Error

## 2020-04-20 ENCOUNTER — Other Ambulatory Visit: Payer: Self-pay

## 2020-04-20 ENCOUNTER — Inpatient Hospital Stay: Payer: BC Managed Care – PPO

## 2020-04-20 VITALS — BP 126/88 | HR 92 | Temp 99.6°F | Resp 18 | Wt 137.5 lb

## 2020-04-20 DIAGNOSIS — C50912 Malignant neoplasm of unspecified site of left female breast: Secondary | ICD-10-CM

## 2020-04-20 DIAGNOSIS — Z5112 Encounter for antineoplastic immunotherapy: Secondary | ICD-10-CM | POA: Diagnosis not present

## 2020-04-20 MED ORDER — SODIUM CHLORIDE 0.9% FLUSH
10.0000 mL | INTRAVENOUS | Status: DC | PRN
  Administered 2020-04-20: 10 mL
  Filled 2020-04-20: qty 10

## 2020-04-20 MED ORDER — DIPHENHYDRAMINE HCL 25 MG PO CAPS
ORAL_CAPSULE | ORAL | Status: AC
  Filled 2020-04-20: qty 1

## 2020-04-20 MED ORDER — PROCHLORPERAZINE MALEATE 10 MG PO TABS
ORAL_TABLET | ORAL | Status: AC
  Filled 2020-04-20: qty 1

## 2020-04-20 MED ORDER — ACETAMINOPHEN 325 MG PO TABS
650.0000 mg | ORAL_TABLET | Freq: Once | ORAL | Status: AC
  Administered 2020-04-20: 650 mg via ORAL

## 2020-04-20 MED ORDER — TRASTUZUMAB-DKST CHEMO 150 MG IV SOLR
6.0000 mg/kg | Freq: Once | INTRAVENOUS | Status: AC
  Administered 2020-04-20: 357 mg via INTRAVENOUS
  Filled 2020-04-20: qty 17

## 2020-04-20 MED ORDER — HEPARIN SOD (PORK) LOCK FLUSH 100 UNIT/ML IV SOLN
500.0000 [IU] | Freq: Once | INTRAVENOUS | Status: AC | PRN
  Administered 2020-04-20: 500 [IU]
  Filled 2020-04-20: qty 5

## 2020-04-20 MED ORDER — ACETAMINOPHEN 325 MG PO TABS
ORAL_TABLET | ORAL | Status: AC
  Filled 2020-04-20: qty 2

## 2020-04-20 MED ORDER — DIPHENHYDRAMINE HCL 25 MG PO CAPS
25.0000 mg | ORAL_CAPSULE | Freq: Once | ORAL | Status: AC
  Administered 2020-04-20: 25 mg via ORAL

## 2020-04-20 MED ORDER — PROCHLORPERAZINE MALEATE 10 MG PO TABS
10.0000 mg | ORAL_TABLET | Freq: Once | ORAL | Status: AC
  Administered 2020-04-20: 10 mg via ORAL

## 2020-04-20 MED ORDER — SODIUM CHLORIDE 0.9 % IV SOLN
Freq: Once | INTRAVENOUS | Status: AC
  Filled 2020-04-20: qty 250

## 2020-04-20 NOTE — Progress Notes (Signed)
Per Dr Burr Medico, ok to proceed without urine pregnancy test as long as pt states there is no possibility of pregnancy.

## 2020-04-20 NOTE — Patient Instructions (Signed)
[facility] Discharge Instructions for Patients Receiving Chemotherapy  Today you received the following chemotherapy agents trastuzumab.  To help prevent nausea and vomiting after your treatment, we encourage you to take your nausea medication as directed.    If you develop nausea and vomiting that is not controlled by your nausea medication, call the clinic.   BELOW ARE SYMPTOMS THAT SHOULD BE REPORTED IMMEDIATELY:  *FEVER GREATER THAN 100.5 F  *CHILLS WITH OR WITHOUT FEVER  NAUSEA AND VOMITING THAT IS NOT CONTROLLED WITH YOUR NAUSEA MEDICATION  *UNUSUAL SHORTNESS OF BREATH  *UNUSUAL BRUISING OR BLEEDING  TENDERNESS IN MOUTH AND THROAT WITH OR WITHOUT PRESENCE OF ULCERS  *URINARY PROBLEMS  *BOWEL PROBLEMS  UNUSUAL RASH Items with * indicate a potential emergency and should be followed up as soon as possible.  Feel free to call the clinic should you have any questions or concerns. The clinic phone number is [phone].  Please show the CHEMO ALERT CARD at check-in to the Emergency Department and triage nurse.   

## 2020-04-21 ENCOUNTER — Ambulatory Visit
Admission: RE | Admit: 2020-04-21 | Discharge: 2020-04-21 | Disposition: A | Discharge status: Home / Self Care | Payer: BC Managed Care – PPO | Source: Ambulatory Visit | Attending: Hematology | Admitting: Hematology

## 2020-04-21 DIAGNOSIS — Z170 Estrogen receptor positive status [ER+]: Secondary | ICD-10-CM

## 2020-04-21 DIAGNOSIS — C50412 Malignant neoplasm of upper-outer quadrant of left female breast: Secondary | ICD-10-CM

## 2020-04-21 HISTORY — DX: Personal history of antineoplastic chemotherapy: Z92.21

## 2020-04-28 ENCOUNTER — Other Ambulatory Visit: Payer: Self-pay

## 2020-04-28 ENCOUNTER — Other Ambulatory Visit: Payer: Self-pay | Admitting: Hematology

## 2020-04-28 ENCOUNTER — Encounter (HOSPITAL_BASED_OUTPATIENT_CLINIC_OR_DEPARTMENT_OTHER): Payer: Self-pay | Admitting: Plastic Surgery

## 2020-04-28 DIAGNOSIS — R928 Other abnormal and inconclusive findings on diagnostic imaging of breast: Secondary | ICD-10-CM

## 2020-05-01 ENCOUNTER — Ambulatory Visit
Admission: RE | Admit: 2020-05-01 | Discharge: 2020-05-01 | Disposition: A | Discharge status: Home / Self Care | Payer: BC Managed Care – PPO | Source: Ambulatory Visit | Attending: Hematology | Admitting: Hematology

## 2020-05-01 ENCOUNTER — Other Ambulatory Visit: Payer: Self-pay

## 2020-05-01 DIAGNOSIS — R928 Other abnormal and inconclusive findings on diagnostic imaging of breast: Secondary | ICD-10-CM

## 2020-05-02 ENCOUNTER — Other Ambulatory Visit (HOSPITAL_COMMUNITY)
Admission: RE | Admit: 2020-05-02 | Discharge: 2020-05-02 | Disposition: A | Discharge status: Home / Self Care | Payer: BC Managed Care – PPO | Source: Ambulatory Visit | Attending: Plastic Surgery | Admitting: Plastic Surgery

## 2020-05-02 DIAGNOSIS — Z20822 Contact with and (suspected) exposure to covid-19: Secondary | ICD-10-CM | POA: Insufficient documentation

## 2020-05-02 DIAGNOSIS — Z87891 Personal history of nicotine dependence: Secondary | ICD-10-CM | POA: Diagnosis not present

## 2020-05-02 DIAGNOSIS — Z79810 Long term (current) use of selective estrogen receptor modulators (SERMs): Secondary | ICD-10-CM | POA: Diagnosis not present

## 2020-05-02 DIAGNOSIS — C50412 Malignant neoplasm of upper-outer quadrant of left female breast: Secondary | ICD-10-CM | POA: Diagnosis not present

## 2020-05-02 DIAGNOSIS — N6011 Diffuse cystic mastopathy of right breast: Secondary | ICD-10-CM | POA: Diagnosis not present

## 2020-05-02 DIAGNOSIS — Z9012 Acquired absence of left breast and nipple: Secondary | ICD-10-CM | POA: Diagnosis not present

## 2020-05-02 DIAGNOSIS — Z01812 Encounter for preprocedural laboratory examination: Secondary | ICD-10-CM | POA: Insufficient documentation

## 2020-05-02 DIAGNOSIS — Z170 Estrogen receptor positive status [ER+]: Secondary | ICD-10-CM | POA: Diagnosis not present

## 2020-05-02 DIAGNOSIS — Z45812 Encounter for adjustment or removal of left breast implant: Secondary | ICD-10-CM | POA: Diagnosis not present

## 2020-05-02 LAB — SARS CORONAVIRUS 2 (TAT 6-24 HRS): SARS Coronavirus 2: NEGATIVE

## 2020-05-05 ENCOUNTER — Ambulatory Visit (HOSPITAL_BASED_OUTPATIENT_CLINIC_OR_DEPARTMENT_OTHER): Payer: BC Managed Care – PPO | Admitting: Certified Registered Nurse Anesthetist

## 2020-05-05 ENCOUNTER — Encounter (HOSPITAL_BASED_OUTPATIENT_CLINIC_OR_DEPARTMENT_OTHER)
Admission: RE | Disposition: A | Discharge status: Home / Self Care | Payer: Self-pay | Source: Home / Self Care | Attending: Plastic Surgery

## 2020-05-05 ENCOUNTER — Encounter (HOSPITAL_BASED_OUTPATIENT_CLINIC_OR_DEPARTMENT_OTHER): Payer: Self-pay | Admitting: Plastic Surgery

## 2020-05-05 ENCOUNTER — Ambulatory Visit (HOSPITAL_BASED_OUTPATIENT_CLINIC_OR_DEPARTMENT_OTHER)
Admission: RE | Admit: 2020-05-05 | Discharge: 2020-05-05 | Disposition: A | Discharge status: Home / Self Care | Payer: BC Managed Care – PPO | Attending: Plastic Surgery | Admitting: Plastic Surgery

## 2020-05-05 ENCOUNTER — Other Ambulatory Visit: Payer: Self-pay

## 2020-05-05 DIAGNOSIS — Z87891 Personal history of nicotine dependence: Secondary | ICD-10-CM | POA: Insufficient documentation

## 2020-05-05 DIAGNOSIS — Z45812 Encounter for adjustment or removal of left breast implant: Secondary | ICD-10-CM | POA: Diagnosis not present

## 2020-05-05 DIAGNOSIS — Z9012 Acquired absence of left breast and nipple: Secondary | ICD-10-CM | POA: Insufficient documentation

## 2020-05-05 DIAGNOSIS — Z20822 Contact with and (suspected) exposure to covid-19: Secondary | ICD-10-CM | POA: Insufficient documentation

## 2020-05-05 DIAGNOSIS — Z170 Estrogen receptor positive status [ER+]: Secondary | ICD-10-CM | POA: Insufficient documentation

## 2020-05-05 DIAGNOSIS — Z79810 Long term (current) use of selective estrogen receptor modulators (SERMs): Secondary | ICD-10-CM | POA: Insufficient documentation

## 2020-05-05 DIAGNOSIS — C50412 Malignant neoplasm of upper-outer quadrant of left female breast: Secondary | ICD-10-CM | POA: Insufficient documentation

## 2020-05-05 DIAGNOSIS — N6011 Diffuse cystic mastopathy of right breast: Secondary | ICD-10-CM | POA: Insufficient documentation

## 2020-05-05 HISTORY — PX: MASTOPEXY: SHX5358

## 2020-05-05 HISTORY — PX: LIPOSUCTION WITH LIPOFILLING: SHX6436

## 2020-05-05 HISTORY — PX: REMOVAL OF TISSUE EXPANDER AND PLACEMENT OF IMPLANT: SHX6457

## 2020-05-05 LAB — POCT PREGNANCY, URINE: Preg Test, Ur: NEGATIVE

## 2020-05-05 SURGERY — REMOVAL, TISSUE EXPANDER, BREAST, WITH IMPLANT INSERTION
Anesthesia: General | Site: Breast | Laterality: Right

## 2020-05-05 MED ORDER — LIDOCAINE HCL (PF) 1 % IJ SOLN
INTRAMUSCULAR | Status: AC
  Filled 2020-05-05: qty 60

## 2020-05-05 MED ORDER — OXYCODONE HCL 5 MG PO TABS: 5.0000 mg | ORAL_TABLET | Freq: Once | ORAL | Status: DC | PRN

## 2020-05-05 MED ORDER — GABAPENTIN 300 MG PO CAPS
300.0000 mg | ORAL_CAPSULE | ORAL | Status: AC
  Administered 2020-05-05: 300 mg via ORAL

## 2020-05-05 MED ORDER — SODIUM BICARBONATE 4 % IV SOLN
INTRAVENOUS | Status: DC | PRN
  Administered 2020-05-05: 300 mL via INTRAMUSCULAR

## 2020-05-05 MED ORDER — FENTANYL CITRATE (PF) 100 MCG/2ML IJ SOLN
INTRAMUSCULAR | Status: AC
  Filled 2020-05-05: qty 2

## 2020-05-05 MED ORDER — 0.9 % SODIUM CHLORIDE (POUR BTL) OPTIME
TOPICAL | Status: DC | PRN
  Administered 2020-05-05: 500 mL

## 2020-05-05 MED ORDER — HYDROMORPHONE HCL 1 MG/ML IJ SOLN
INTRAMUSCULAR | Status: AC
  Filled 2020-05-05: qty 0.5

## 2020-05-05 MED ORDER — GLYCOPYRROLATE PF 0.2 MG/ML IJ SOSY
PREFILLED_SYRINGE | INTRAMUSCULAR | Status: AC
  Filled 2020-05-05: qty 1

## 2020-05-05 MED ORDER — OXYCODONE HCL 5 MG/5ML PO SOLN: 5.0000 mg | Freq: Once | ORAL | Status: DC | PRN

## 2020-05-05 MED ORDER — CELECOXIB 200 MG PO CAPS
ORAL_CAPSULE | ORAL | Status: AC
  Filled 2020-05-05: qty 1

## 2020-05-05 MED ORDER — CELECOXIB 200 MG PO CAPS
200.0000 mg | ORAL_CAPSULE | ORAL | Status: AC
  Administered 2020-05-05: 200 mg via ORAL

## 2020-05-05 MED ORDER — FENTANYL CITRATE (PF) 100 MCG/2ML IJ SOLN
INTRAMUSCULAR | Status: DC | PRN
  Administered 2020-05-05 (×2): 25 ug via INTRAVENOUS
  Administered 2020-05-05: 50 ug via INTRAVENOUS
  Administered 2020-05-05 (×4): 25 ug via INTRAVENOUS
  Administered 2020-05-05 (×2): 50 ug via INTRAVENOUS

## 2020-05-05 MED ORDER — CHLORHEXIDINE GLUCONATE CLOTH 2 % EX PADS: 6.0000 | MEDICATED_PAD | Freq: Once | CUTANEOUS | Status: DC

## 2020-05-05 MED ORDER — PROPOFOL 10 MG/ML IV BOLUS
INTRAVENOUS | Status: DC | PRN
  Administered 2020-05-05: 180 mg via INTRAVENOUS

## 2020-05-05 MED ORDER — EPINEPHRINE PF 1 MG/ML IJ SOLN
INTRAMUSCULAR | Status: AC
  Filled 2020-05-05: qty 1

## 2020-05-05 MED ORDER — SODIUM BICARBONATE 4.2 % IV SOLN
INTRAVENOUS | Status: AC
  Filled 2020-05-05: qty 20

## 2020-05-05 MED ORDER — HYDROMORPHONE HCL 1 MG/ML IJ SOLN
0.2500 mg | INTRAMUSCULAR | Status: DC | PRN
  Administered 2020-05-05 (×2): 0.5 mg via INTRAVENOUS

## 2020-05-05 MED ORDER — SODIUM CHLORIDE 0.9 % IV SOLN: INTRAVENOUS | Status: DC | PRN

## 2020-05-05 MED ORDER — DEXAMETHASONE SODIUM PHOSPHATE 10 MG/ML IJ SOLN
INTRAMUSCULAR | Status: AC
  Filled 2020-05-05: qty 1

## 2020-05-05 MED ORDER — DEXAMETHASONE SODIUM PHOSPHATE 4 MG/ML IJ SOLN
INTRAMUSCULAR | Status: DC | PRN
  Administered 2020-05-05: 5 mg via INTRAVENOUS

## 2020-05-05 MED ORDER — PROMETHAZINE HCL 25 MG/ML IJ SOLN: 6.2500 mg | INTRAMUSCULAR | Status: DC | PRN

## 2020-05-05 MED ORDER — PROPOFOL 10 MG/ML IV BOLUS
INTRAVENOUS | Status: AC
  Filled 2020-05-05: qty 20

## 2020-05-05 MED ORDER — CEFAZOLIN SODIUM-DEXTROSE 2-4 GM/100ML-% IV SOLN
2.0000 g | INTRAVENOUS | Status: AC
  Administered 2020-05-05: 2 g via INTRAVENOUS

## 2020-05-05 MED ORDER — ROCURONIUM BROMIDE 10 MG/ML (PF) SYRINGE
PREFILLED_SYRINGE | INTRAVENOUS | Status: AC
  Filled 2020-05-05: qty 10

## 2020-05-05 MED ORDER — AMISULPRIDE (ANTIEMETIC) 5 MG/2ML IV SOLN: 10.0000 mg | Freq: Once | INTRAVENOUS | Status: DC | PRN

## 2020-05-05 MED ORDER — LIDOCAINE 2% (20 MG/ML) 5 ML SYRINGE
INTRAMUSCULAR | Status: AC
  Filled 2020-05-05: qty 5

## 2020-05-05 MED ORDER — MIDAZOLAM HCL 5 MG/5ML IJ SOLN
INTRAMUSCULAR | Status: DC | PRN
  Administered 2020-05-05: 2 mg via INTRAVENOUS

## 2020-05-05 MED ORDER — ACETAMINOPHEN 500 MG PO TABS
1000.0000 mg | ORAL_TABLET | ORAL | Status: AC
  Administered 2020-05-05: 1000 mg via ORAL

## 2020-05-05 MED ORDER — LACTATED RINGERS IV SOLN: INTRAVENOUS | Status: DC

## 2020-05-05 MED ORDER — GABAPENTIN 300 MG PO CAPS
ORAL_CAPSULE | ORAL | Status: AC
  Filled 2020-05-05: qty 1

## 2020-05-05 MED ORDER — CEFAZOLIN SODIUM-DEXTROSE 2-4 GM/100ML-% IV SOLN
INTRAVENOUS | Status: AC
  Filled 2020-05-05: qty 100

## 2020-05-05 MED ORDER — MIDAZOLAM HCL 2 MG/2ML IJ SOLN
INTRAMUSCULAR | Status: AC
  Filled 2020-05-05: qty 2

## 2020-05-05 MED ORDER — BUPIVACAINE HCL (PF) 0.5 % IJ SOLN
INTRAMUSCULAR | Status: AC
  Filled 2020-05-05: qty 30

## 2020-05-05 MED ORDER — ONDANSETRON HCL 4 MG/2ML IJ SOLN
INTRAMUSCULAR | Status: AC
  Filled 2020-05-05: qty 2

## 2020-05-05 MED ORDER — GLYCOPYRROLATE 0.2 MG/ML IJ SOLN
INTRAMUSCULAR | Status: DC | PRN
  Administered 2020-05-05: .1 mg via INTRAVENOUS

## 2020-05-05 MED ORDER — ACETAMINOPHEN 500 MG PO TABS
ORAL_TABLET | ORAL | Status: AC
  Filled 2020-05-05: qty 2

## 2020-05-05 MED ORDER — LIDOCAINE HCL (CARDIAC) PF 100 MG/5ML IV SOSY
PREFILLED_SYRINGE | INTRAVENOUS | Status: DC | PRN
  Administered 2020-05-05: 60 mg via INTRAVENOUS

## 2020-05-05 MED ORDER — ONDANSETRON HCL 4 MG/2ML IJ SOLN
INTRAMUSCULAR | Status: DC | PRN
  Administered 2020-05-05: 4 mg via INTRAVENOUS

## 2020-05-05 MED ORDER — MEPERIDINE HCL 25 MG/ML IJ SOLN: 6.2500 mg | INTRAMUSCULAR | Status: DC | PRN

## 2020-05-05 MED ORDER — BUPIVACAINE HCL (PF) 0.5 % IJ SOLN
INTRAMUSCULAR | Status: DC | PRN
  Administered 2020-05-05: 10 mL

## 2020-05-05 SURGICAL SUPPLY — 102 items
BAG DECANTER FOR FLEXI CONT (MISCELLANEOUS) ×4 IMPLANT
BINDER ABDOMINAL  9 SM 30-45 (SOFTGOODS) ×2
BINDER ABDOMINAL 10 UNV 27-48 (MISCELLANEOUS) IMPLANT
BINDER ABDOMINAL 12 SM 30-45 (SOFTGOODS) IMPLANT
BINDER ABDOMINAL 9 SM 30-45 (SOFTGOODS) ×2 IMPLANT
BINDER BREAST LRG (GAUZE/BANDAGES/DRESSINGS) ×4 IMPLANT
BINDER BREAST MEDIUM (GAUZE/BANDAGES/DRESSINGS) ×4 IMPLANT
BINDER BREAST XLRG (GAUZE/BANDAGES/DRESSINGS) IMPLANT
BINDER BREAST XXLRG (GAUZE/BANDAGES/DRESSINGS) IMPLANT
BLADE SURG 10 STRL SS (BLADE) ×8 IMPLANT
BLADE SURG 11 STRL SS (BLADE) ×4 IMPLANT
BLADE SURG 15 STRL LF DISP TIS (BLADE) IMPLANT
BLADE SURG 15 STRL SS (BLADE)
BNDG GAUZE ELAST 4 BULKY (GAUZE/BANDAGES/DRESSINGS) ×8 IMPLANT
CANISTER LIPO FAT HARVEST (MISCELLANEOUS) ×4 IMPLANT
CANISTER SUCT 1200ML W/VALVE (MISCELLANEOUS) ×4 IMPLANT
CHLORAPREP W/TINT 26 (MISCELLANEOUS) ×8 IMPLANT
CLOSURE WOUND 1/2 X4 (GAUZE/BANDAGES/DRESSINGS)
COVER BACK TABLE 60X90IN (DRAPES) ×4 IMPLANT
COVER MAYO STAND STRL (DRAPES) ×8 IMPLANT
COVER WAND RF STERILE (DRAPES) IMPLANT
DECANTER SPIKE VIAL GLASS SM (MISCELLANEOUS) ×4 IMPLANT
DERMABOND ADVANCED (GAUZE/BANDAGES/DRESSINGS) ×4
DERMABOND ADVANCED .7 DNX12 (GAUZE/BANDAGES/DRESSINGS) ×4 IMPLANT
DRAIN CHANNEL 15F RND FF W/TCR (WOUND CARE) ×4 IMPLANT
DRAIN CHANNEL 19F RND (DRAIN) IMPLANT
DRAPE TOP ARMCOVERS (MISCELLANEOUS) ×4 IMPLANT
DRAPE U-SHAPE 76X120 STRL (DRAPES) ×4 IMPLANT
DRAPE UTILITY XL STRL (DRAPES) ×4 IMPLANT
DRSG PAD ABDOMINAL 8X10 ST (GAUZE/BANDAGES/DRESSINGS) ×16 IMPLANT
DRSG TEGADERM 2-3/8X2-3/4 SM (GAUZE/BANDAGES/DRESSINGS) ×4 IMPLANT
ELECT BLADE 4.0 EZ CLEAN MEGAD (MISCELLANEOUS)
ELECT COATED BLADE 2.86 ST (ELECTRODE) ×4 IMPLANT
ELECT REM PT RETURN 9FT ADLT (ELECTROSURGICAL) ×4
ELECTRODE BLDE 4.0 EZ CLN MEGD (MISCELLANEOUS) IMPLANT
ELECTRODE REM PT RTRN 9FT ADLT (ELECTROSURGICAL) ×2 IMPLANT
EVACUATOR SILICONE 100CC (DRAIN) ×4 IMPLANT
EXTRACTOR CANIST REVOLVE STRL (CANNISTER) IMPLANT
GAUZE SPONGE 4X4 12PLY STRL (GAUZE/BANDAGES/DRESSINGS) IMPLANT
GAUZE SPONGE 4X4 12PLY STRL LF (GAUZE/BANDAGES/DRESSINGS) IMPLANT
GLOVE BIO SURGEON STRL SZ 6 (GLOVE) ×12 IMPLANT
GLOVE BIOGEL PI IND STRL 6.5 (GLOVE) ×2 IMPLANT
GLOVE BIOGEL PI IND STRL 7.0 (GLOVE) ×2 IMPLANT
GLOVE BIOGEL PI INDICATOR 6.5 (GLOVE) ×2
GLOVE BIOGEL PI INDICATOR 7.0 (GLOVE) ×2
GLOVE ECLIPSE 6.5 STRL STRAW (GLOVE) ×4 IMPLANT
GOWN STRL REUS W/ TWL LRG LVL3 (GOWN DISPOSABLE) ×4 IMPLANT
GOWN STRL REUS W/TWL LRG LVL3 (GOWN DISPOSABLE) ×4
IMPL BREAST P5XFULL RND 385 (Breast) ×2 IMPLANT
IMPL BRST P5XFULL RND 385CC (Breast) ×2 IMPLANT
IMPLANT BREAST GEL 385CC (Breast) ×2 IMPLANT
IV NS 1000ML (IV SOLUTION)
IV NS 1000ML BAXH (IV SOLUTION) IMPLANT
IV NS 500ML (IV SOLUTION)
IV NS 500ML BAXH (IV SOLUTION) IMPLANT
KIT FILL SYSTEM UNIVERSAL (SET/KITS/TRAYS/PACK) IMPLANT
LINER CANISTER 1000CC FLEX (MISCELLANEOUS) ×4 IMPLANT
MARKER SKIN DUAL TIP RULER LAB (MISCELLANEOUS) IMPLANT
NDL SAFETY ECLIPSE 18X1.5 (NEEDLE) ×2 IMPLANT
NEEDLE FILTER BLUNT 18X 1/2SAF (NEEDLE) ×2
NEEDLE FILTER BLUNT 18X1 1/2 (NEEDLE) ×2 IMPLANT
NEEDLE HYPO 18GX1.5 SHARP (NEEDLE) ×2
NEEDLE HYPO 25X1 1.5 SAFETY (NEEDLE) ×4 IMPLANT
NS IRRIG 1000ML POUR BTL (IV SOLUTION) ×4 IMPLANT
PACK BASIN DAY SURGERY FS (CUSTOM PROCEDURE TRAY) ×4 IMPLANT
PAD ALCOHOL SWAB (MISCELLANEOUS) ×8 IMPLANT
PENCIL SMOKE EVACUATOR (MISCELLANEOUS) ×4 IMPLANT
PIN SAFETY STERILE (MISCELLANEOUS) ×4 IMPLANT
SHEET MEDIUM DRAPE 40X70 STRL (DRAPES) ×8 IMPLANT
SIZER BREAST REUSE 365CC (SIZER) ×4
SIZER BREAST REUSE 385CC (SIZER) ×4
SIZER BRST REUSE 365CC (SIZER) ×2 IMPLANT
SIZER BRST REUSE 385CC (SIZER) ×2 IMPLANT
SLEEVE SCD COMPRESS KNEE MED (MISCELLANEOUS) ×4 IMPLANT
SPONGE LAP 18X18 RF (DISPOSABLE) ×8 IMPLANT
STAPLER VISISTAT 35W (STAPLE) ×4 IMPLANT
STRIP CLOSURE SKIN 1/2X4 (GAUZE/BANDAGES/DRESSINGS) IMPLANT
SUT ETHILON 2 0 FS 18 (SUTURE) ×4 IMPLANT
SUT MNCRL AB 4-0 PS2 18 (SUTURE) ×12 IMPLANT
SUT PDS AB 2-0 CT2 27 (SUTURE) ×4 IMPLANT
SUT SILK 2 0 SH (SUTURE) IMPLANT
SUT VIC AB 3-0 PS1 18 (SUTURE) ×6
SUT VIC AB 3-0 PS1 18XBRD (SUTURE) ×6 IMPLANT
SUT VIC AB 3-0 SH 27 (SUTURE) ×2
SUT VIC AB 3-0 SH 27X BRD (SUTURE) ×2 IMPLANT
SUT VICRYL 4-0 PS2 18IN ABS (SUTURE) ×8 IMPLANT
SYR 10ML LL (SYRINGE) ×16 IMPLANT
SYR 20ML LL LF (SYRINGE) ×4 IMPLANT
SYR 50ML LL SCALE MARK (SYRINGE) ×20 IMPLANT
SYR BULB IRRIG 60ML STRL (SYRINGE) IMPLANT
SYR CONTROL 10ML LL (SYRINGE) ×4 IMPLANT
SYR TB 1ML LL NO SAFETY (SYRINGE) ×4 IMPLANT
SYR TOOMEY IRRIG 70ML (MISCELLANEOUS)
SYRINGE TOOMEY IRRIG 70ML (MISCELLANEOUS) IMPLANT
TAPE MEASURE VINYL STERILE (MISCELLANEOUS) IMPLANT
TOWEL GREEN STERILE FF (TOWEL DISPOSABLE) ×8 IMPLANT
TUBE CONNECTING 20'X1/4 (TUBING) ×2
TUBE CONNECTING 20X1/4 (TUBING) ×6 IMPLANT
TUBING INFILTRATION IT-10001 (TUBING) ×4 IMPLANT
TUBING SET GRADUATE ASPIR 12FT (MISCELLANEOUS) ×4 IMPLANT
UNDERPAD 30X36 HEAVY ABSORB (UNDERPADS AND DIAPERS) ×8 IMPLANT
YANKAUER SUCT BULB TIP NO VENT (SUCTIONS) ×4 IMPLANT

## 2020-05-05 NOTE — Discharge Instructions (Addendum)
  Post Anesthesia Home Care Instructions  Activity: Get plenty of rest for the remainder of the day. A responsible individual must stay with you for 24 hours following the procedure.  For the next 24 hours, DO NOT: -Drive a car -Paediatric nurse -Drink alcoholic beverages -Take any medication unless instructed by your physician -Make any legal decisions or sign important papers.  Meals: Start with liquid foods such as gelatin or soup. Progress to regular foods as tolerated. Avoid greasy, spicy, heavy foods. If nausea and/or vomiting occur, drink only clear liquids until the nausea and/or vomiting subsides. Call your physician if vomiting continues.  Special Instructions/Symptoms: Your throat may feel dry or sore from the anesthesia or the breathing tube placed in your throat during surgery. If this causes discomfort, gargle with warm salt water. The discomfort should disappear within 24 hours.  If you had a scopolamine patch placed behind your ear for the management of post- operative nausea and/or vomiting:  1. The medication in the patch is effective for 72 hours, after which it should be removed.  Wrap patch in a tissue and discard in the trash. Wash hands thoroughly with soap and water. 2. You may remove the patch earlier than 72 hours if you experience unpleasant side effects which may include dry mouth, dizziness or visual disturbances. 3. Avoid touching the patch. Wash your hands with soap and water after contact with the patch.      JP Drain [employer] this sheet to all of your post-operative appointments while you have your drains.  Please measure your drains by CC's or ML's.  Make sure you drain and measure your JP Drains 2 or 3 times per day.  At the end of each day, add up totals for the left side and add up totals for the right side.    ( 9 am )     ( 3 pm )        ( 9 pm )                Date L  R  L  R  L  R  Total L/R                                                                                                                                                                                          No tylenol until after 1pm today if needed.  No ibuprofen until after 3pm today if needed.

## 2020-05-05 NOTE — Anesthesia Preprocedure Evaluation (Signed)
Anesthesia Evaluation  Patient identified by MRN, date of birth, ID band Patient awake    Reviewed: Allergy & Precautions, NPO status , Patient's Chart, lab work & pertinent test results  Airway Mallampati: I  TM Distance: >3 FB Neck ROM: Full    Dental no notable dental hx. (+) Teeth Intact   Pulmonary Patient abstained from smoking., former smoker,    Pulmonary exam normal breath sounds clear to auscultation       Cardiovascular negative cardio ROS Normal cardiovascular exam Rhythm:Regular Rate:Normal     Neuro/Psych negative neurological ROS  negative psych ROS   GI/Hepatic Neg liver ROS, GERD  Medicated and Controlled,  Endo/Other  Left Breast Ca  Renal/GU negative Renal ROS  negative genitourinary   Musculoskeletal Scoliosis   Abdominal   Peds  Hematology negative hematology ROS (+)   Anesthesia Other Findings   Reproductive/Obstetrics                             Anesthesia Physical  Anesthesia Plan  ASA: II  Anesthesia Plan: General   Post-op Pain Management:    Induction: Intravenous  PONV Risk Score and Plan: 3 and Midazolam, Ondansetron, Dexamethasone and Treatment may vary due to age or medical condition  Airway Management Planned: LMA and Oral ETT  Additional Equipment:   Intra-op Plan:   Post-operative Plan: Extubation in OR  Informed Consent: I have reviewed the patients History and Physical, chart, labs and discussed the procedure including the risks, benefits and alternatives for the proposed anesthesia with the patient or authorized representative who has indicated his/her understanding and acceptance.     Dental advisory given  Plan Discussed with: CRNA, Surgeon and Anesthesiologist  Anesthesia Plan Comments:         Anesthesia Quick Evaluation

## 2020-05-05 NOTE — Op Note (Signed)
Operative Note   DATE OF OPERATION: 11.12.21  LOCATION: [facility]-outpatient  SURGICAL DIVISION: Plastic Surgery  PREOPERATIVE DIAGNOSES:  1. History breast cancer 2. Acquired absence left breast   POSTOPERATIVE DIAGNOSES:  same  PROCEDURE:  1. Removal left chest tissue expander and placement silicone implant 2. Lipofiling from abdomen to left chest 70 ml 3. Right breast mastopexy  SURGEON: Irene Limbo MD MBA  ASSISTANT: none  ANESTHESIA:  General.   EBL: 75 ml  COMPLICATIONS: None immediate.   INDICATIONS FOR PROCEDURE:  The patient, Victoria Barker, is a 39 y.o. female born on [birth year], is here for staged breast reconstruction following left nipple sparing mastectomy with prepectoral tissue expander ADM reconstruction.    FINDINGS: Full incorporation left chest ADM noted. Right mastopexy 74 g resection. 70 ml fat infiltrated over left mastectomy flap and chest. Natrelle Soft Touch Smooth Round Full Profile 385 ml implant placed REF [id number]  DESCRIPTION OF PROCEDURE:  The patient's operative site was marked with the patient in the preoperative area including anterior axillary lines, chest midline, breast meridians and supra and infraumbilical abdomen for donor site lipofiling. The location of new nipple areolar complex was marked on right symmetric with left. With aid of Wise pattern marker, location of new nipple areolar complex and vertical limbs (6cm) were markedby displacement of breasts along meridian. The patient was taken to the operating room. SCDs were placed and IV antibiotics were given. The patient's operative site was prepped and draped in a sterile fashion. A time out was performed and all information was confirmed to be correct. Incision made in left chest inframammary fold scar and carried through superficial fascia and acellular dermis. Expander removed. Superior and upper outer capsulotomies performed. Sizer placed.   Over right breast, in  supine position lateral limbs for resection marked. Areola incised with 38 mm diameter marker. Superior pedicle marked and de epithelialized. Pedicle developed to chest wall. Medial and lateral pillars elevated. Medial and lateral Lower pole medial and lateral breast tissue excised. Central lower pole breast tissue maintained on inferior pedicle dermoglandular pedicle. This was advanced superiorly beneath superior pedicle as auto augmentation flap and secured to chest wall with 2-0 PDS suture. Patient tailor tacked closed.  Patient brought to upright sitting position and assessed for symmetry. A Full Profile 385 ml implant selected for left chest. Patent returned to supine position. Right breast cavity irrigated and hemostasis obtained.Local anesthetic infiltrated. 15 Fr JP placed and secured with 2-0 nylon. Closure completed with 3-0 vicryl to approximate dermis along inframammary fold and vertical limb. NAC inset with 4-0 vicryl in dermis. Skin closure completed with 4-0 monocryl subcuticular throughout.  Stab incision made over bilateralabdomen andtumescent fluid infiltrated over supra and infra umbilical [id number] tumescent infiltrated. Power assisted liposuction performed to endpoint symmetric contour and soft tissue thickness, total lipoaspirate228ml. The fat was then washed and prepared by gravity for infiltration. Harvested fat was then infiltrated in subcutaneous plane throughout total envelope mastectomy flap left chest, total 70 ml.  Left chest cavity irrigated with saline followed by betadine, Ancef, gentamicinsolution.Hemostasis ensured. The implant was placed inleft chest, andimplant orientation ensured. Closure completed with 3-0 vicryl to closesuperficial fascia and ADMover implant.4-0 vicryl used to close dermis followed by 4-0 monocryl subcuticular. Liposuction portincisions approximated with simple 4-0 monocryl stitch.  Tissue adhesive applied to chest  incisions. Dry dressing and breast binder, abdominal binder applied.The patient was allowed to wake from anesthesia, extubated and taken to the recovery room in satisfactory condition.  SPECIMENS:right breast mastopexy  DRAINS: 15 Fr JP in right breast

## 2020-05-05 NOTE — Anesthesia Procedure Notes (Signed)
Procedure Name: LMA Insertion Date/Time: 05/05/2020 7:32 AM Performed by: Bufford Spikes, CRNA Pre-anesthesia Checklist: Patient identified, Emergency Drugs available, Suction available and Patient being monitored Patient Re-evaluated:Patient Re-evaluated prior to induction Oxygen Delivery Method: Circle system utilized Preoxygenation: Pre-oxygenation with 100% oxygen Induction Type: IV induction Ventilation: Mask ventilation without difficulty LMA: LMA inserted LMA Size: 4.0 Number of attempts: 1 Airway Equipment and Method: Bite block Placement Confirmation: positive ETCO2 Tube secured with: Tape Dental Injury: Teeth and Oropharynx as per pre-operative assessment

## 2020-05-05 NOTE — Transfer of Care (Signed)
Immediate Anesthesia Transfer of Care Note  Patient: Radio producer  Procedure(s) Performed: REMOVAL OF LEFT CHEST TISSUE EXPANDER AND PLACEMENT OF IMPLANT (Left Breast) RIGHT BREAST MASTOPEXY (Right Breast) LIPOFILLING FROM ABDOMEN TO RIGHT CHEST (Right Breast)  Patient Location: PACU  Anesthesia Type:General  Level of Consciousness: awake, alert  and oriented  Airway & Oxygen Therapy: Patient Spontanous Breathing and Patient connected to nasal cannula oxygen  Post-op Assessment: Report given to RN and Post -op Vital signs reviewed and stable  Post vital signs: Reviewed and stable  Last Vitals:  Vitals Value Taken Time  BP 142/90 05/05/20 1053  Temp 37.2 C 05/05/20 1053  Pulse 89 05/05/20 1056  Resp 21 05/05/20 1056  SpO2 100 % 05/05/20 1056  Vitals shown include unvalidated device data.  Last Pain:  Vitals:   05/05/20 1053  TempSrc:   PainSc: 7          Complications: No complications documented.

## 2020-05-05 NOTE — Anesthesia Postprocedure Evaluation (Signed)
Anesthesia Post Note  Patient: Radio producer  Procedure(s) Performed: REMOVAL OF LEFT CHEST TISSUE EXPANDER AND PLACEMENT OF IMPLANT (Left Breast) RIGHT BREAST MASTOPEXY (Right Breast) LIPOFILLING FROM ABDOMEN TO RIGHT CHEST (Right Breast)     Patient location during evaluation: PACU Anesthesia Type: General Level of consciousness: sedated and patient cooperative Pain management: pain level controlled Vital Signs Assessment: post-procedure vital signs reviewed and stable Respiratory status: spontaneous breathing Cardiovascular status: stable Anesthetic complications: no   No complications documented.  Last Vitals:  Vitals:   05/05/20 1130 05/05/20 1212  BP: 137/86 135/88  Pulse: 73 97  Resp: 11 18  Temp:  36.9 C  SpO2: 100% 100%    Last Pain:  Vitals:   05/05/20 1212  TempSrc:   PainSc: 2                  Nolon Nations

## 2020-05-05 NOTE — Interval H&P Note (Signed)
History and Physical Interval Note:  05/05/2020 7:07 AM  Victoria Barker  has presented today for surgery, with the diagnosis of left breast ca UOQ ER positive, acquired absence breast left.  The various methods of treatment have been discussed with the patient and family. After consideration of risks, benefits and other options for treatment, the patient has consented to  removal tissue expander and placement silicone implant left chest, right breast mastopexy, lipofilling from abdomen to left chest as a surgical intervention.  The patient's history has been reviewed, patient examined, no change in status, stable for surgery.  I have reviewed the patient's chart and labs.  Questions were answered to the patient's satisfaction.     Arnoldo Hooker [facility]

## 2020-05-08 LAB — SURGICAL PATHOLOGY

## 2020-05-08 NOTE — Progress Notes (Signed)
Victoria Barker   [phone] Fax:([phone]) [phone]   Clinic Follow up Note   Patient Care Team: Victoria Math, MD as PCP - General (Obstetrics and Gynecology) Victoria Kaufmann, RN as Oncology Nurse Navigator Victoria Germany, RN as Oncology Nurse Navigator Victoria Mesa, MD as Consulting Physician (General Surgery) Victoria Merle, MD as Consulting Physician (Hematology) Victoria Gibson, MD as Attending Physician (Radiation Oncology)  Date of Service:  05/11/2020  CHIEF COMPLAINT: F/u of left breast cancer  SUMMARY OF ONCOLOGIC HISTORY: Oncology History Overview Note  Cancer Staging Malignant neoplasm of upper-outer quadrant of left breast in female, estrogen receptor positive ([name]) Staging form: Breast, AJCC 8th Edition - Clinical stage from 04/28/2019: Stage IIA (cT2, cN0, cM0, G2, ER+, PR-, HER2: Equivocal) - Signed by Victoria Merle, MD on 05/04/2019 - Pathologic stage from 07/13/2019: Stage IIA (pT2, pN0, cM0, G3, ER+, PR-, HER2+, Oncotype DX score: 56) - Signed by Victoria Merle, MD on 08/20/2019    Malignant neoplasm of upper-outer quadrant of left breast in female, estrogen receptor positive ([name])  04/21/2019 Mammogram   Diagnostic mammogram 04/21/19  IMPRESSION 1. findings highly suspicious for left breast cancer. There is a palpable irregular mass 4cm from nipple measuring 2.4x2.4x1.8cm in the 1:00 position axis of the left breast and pleomorphic  calcifications in the upper-outer quadrant as described above.  2. single left 0.9x0.5cm axillary lymph node with a mild/borderline thickened cortex.    04/28/2019 Cancer Staging   Staging form: Breast, AJCC 8th Edition - Clinical stage from 04/28/2019: Stage IIA (cT2, cN0, cM0, G2, ER+, PR-, HER2: Equivocal) - Signed by Victoria Merle, MD on 05/04/2019   04/28/2019 Initial Biopsy   Diagnosis 04/28/19 1. Breast, left, needle core biopsy, 1 o'clock position - INVASIVE DUCTAL CARCINOMA. - DUCTAL CARCINOMA IN SITU. - SEE  COMMENT. 2. Lymph node, needle/core biopsy, left axilla - THERE IS NO EVIDENCE OF CARCINOMA IN 1 OF 1 LYMPH NODE (0/1). 3. Breast, left, needle core biopsy, upper, outer quadrant - DUCTAL CARCINOMA IN SITU WITH CALCIFICATIONS. - SEE COMMENT.   04/28/2019 Receptors her2   1. PROGNOSTIC INDICATORS Results: HER2 negative  Estrogen Receptor: 80%, POSITIVE, STRONG STAINING INTENSITY Progesterone Receptor: 0%, NEGATIVE Proliferation Marker Ki67: 35%    04/30/2019 Initial Diagnosis   Malignant neoplasm of upper-outer quadrant of left breast in female, estrogen receptor positive ([name])    Genetic Testing   No pathogenic variants identified. 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 The final report date is 05/28/2019.  The STAT Breast cancer panel offered by [facility] includes sequencing and rearrangement analysis for the following 9 genes:  ATM, BRCA1, BRCA2, CDH1, CHEK2, PALB2, PTEN, STK11 and TP53.    The Common Hereditary Cancers Panel offered by [facility] includes sequencing and/or deletion duplication testing of the following 48 genes: APC, ATM, AXIN2, BARD1, BMPR1A, BRCA1, BRCA2, BRIP1, CDH1, CDKN2A (p14ARF), CDKN2A (p16INK4a), CKD4, CHEK2, CTNNA1, DICER1, EPCAM (Deletion/duplication testing only), GREM1 (promoter region deletion/duplication testing only), KIT, MEN1, MLH1, MSH2, MSH3, MSH6, MUTYH, NBN, NF1, NHTL1, PALB2, PDGFRA, PMS2, POLD1, POLE, PTEN, RAD50, RAD51C, RAD51D, RNF43, SDHB, SDHC, SDHD, SMAD4, SMARCA4. STK11, TP53, TSC1, TSC2, and VHL.  The following genes were evaluated for sequence changes only: SDHA and HOXB13 c.251G>A variant only.   05/11/2019 Breast MRI   IMPRESSION: 1. Enhancing mass and architectural distortion spanning approximately 6 x 3 x 5.3 cm in the upper outer left breast consistent with the patient's biopsy-proven site of invasive  cancer. 2. Biopsy changes in the anterior left breast at the  site of the patient's biopsy-proven ductal carcinoma in situ. 3. Suspicious changes within the upper-outer quadrant of the left breast spanning a total of 8 cm in the AP dimension. This includes the site of biopsy-proven DCIS anteriorly and suspicious enhancement extending to the level of the pectoralis muscle posteriorly. 4. Suspicious 7 mm enhancing mass in the far posterior slightly lateral left breast (image 138/212). 5. No MRI evidence of malignancy on the right. 6. No suspicious lymphadenopathy.     05/26/2019 -  Anti-estrogen oral therapy   Tamoxifen 92m daily starting 05/26/19. Stopped before surgery and chemo on 07/13/19. Restart in 12/2019.    07/13/2019 Surgery   LEFT NIPPLE SPARING MASTECTOMY WITH SENTINEL LYMPH NODE BIOPSY and LEFT BREAST RECONSTRUCTION WITH PLACEMENT OF TISSUE EXPANDER AND ALLODERM by D.r Tsuie and Dr TIran Planas    07/13/2019 Pathology Results   SURGICAL PATHOLOGY   FINAL MICROSCOPIC DIAGNOSIS:   A. LYMPH NODE, LEFT AXILLARY #1, SENTINEL, BIOPSY:  - One of one lymph nodes negative for carcinoma (0/1).   B. BREAST, LEFT, MASTECTOMY:  - Invasive ductal carcinoma, grade 3, spanning 3.4 cm.  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  - Invasive carcinoma is 0.2 cm from the posterior margin focally (not on  ink) and 0.4 cm from the  anterior margin focally (not on ink).  - In situ carcinoma is 0.4 cm from the posterior margin focally (not on  ink).  - Biopsy site x2.  - See oncology table.   C. NIPPLE, LEFT, BIOPSY:  - Benign nipple tissue.   D. LYMPH NODE, LEFT AXILLARY #2, SENTINEL, BIOPSY:  - One of one lymph nodes negative for carcinoma (0/1).     GROUP 1:  HER2 **POSITIVE**    07/13/2019 Oncotype testing   Ocotype score 56 with 39% risk of recurrence with Tmaoxifen or AI alone. There is a 15% benefit from adjuvant chemo.     07/13/2019 Cancer Staging   Staging form: Breast, AJCC 8th Edition - Pathologic stage from 07/13/2019:  Stage IIA (pT2, pN0, cM0, G3, ER+, PR-, HER2+, Oncotype DX score: 56) - Signed by FTruitt Merle MD on 08/20/2019   08/25/2019 Echocardiogram   Baseline Echo  IMPRESSIONS     1. Left ventricular ejection fraction, by estimation, is 65 to 70%. The  left ventricle has normal function. The left ventricle has no regional  wall motion abnormalities. Left ventricular diastolic parameters were  normal.   2. Right ventricular systolic function is normal. The right ventricular  size is normal. There is normal pulmonary artery systolic pressure.   3. Left atrial size was mildly dilated.   4. The mitral valve is normal in structure and function. Trivial mitral  valve regurgitation.   5. The aortic valve is tricuspid. Aortic valve regurgitation is not  visualized.   6. The inferior vena cava is normal in size with greater than 50%  respiratory variability, suggesting right atrial pressure of 3 mmHg.   7. The aortic valve is not well seen. It is trileaflet. There appears to  be some calcification associated with the non-coronary cusp but this is  not well seen.    08/25/2019 Imaging   Whole body Bone Scan  IMPRESSION: No significant abnormality identified.   08/25/2019 Imaging   CT CAP w Contrast  IMPRESSION: 1. No findings of metastatic disease to the chest, abdomen, or pelvis. 2. 0.7 by 0.3 cm sclerotic lesion in the right  seventh rib laterally is likely benign but merits surveillance. 3. Mild sclerosis along the sacroiliac joints bilaterally, query mild chronic sacroiliitis.     09/01/2019 Procedure   INSERTION PORT-A-CATH WITH ULTRASOUND GUIDANCE by Dr. Georgette Dover   09/02/2019 -  Chemotherapy   TCH (Taxol, Carboplatin, Herceptin) q3weeks for 6 cycles starting 09/02/19-12/16/19, followed by anti-HER2 maintenance therapy with Herceptin q3weeks starting 01/06/20 to complete 1 year of treatment (from 08/2019)     05/05/2020 Surgery   REMOVAL OF LEFT CHEST TISSUE EXPANDER AND PLACEMENT OF IMPLANT and  RIGHT BREAST MASTOPEXY and LIPOFILLING FROM ABDOMEN TO RIGHT CHEST by Dr Iran Planas   Invasive ductal carcinoma of breast, female, left ([place])  07/13/2019 Initial Diagnosis   Invasive ductal carcinoma of breast, female, left ([place])      CURRENT THERAPY:  -Tamoxifen 65m daily starting 05/26/19. Stopped before surgery and chemo on 07/13/19. Restart in 12/2019. -[facility] starting 09/02/19-12/16/19, followed by maintenance Herceptinstarting 7/15/21to complete one year therapy(from 08/2019).  INTERVAL HISTORY:  Victoria Gerdtsis here for a follow up. She presents to the clinic alone. She underwent left breast implant placement, right breast mastopexy with Dr. TIran Planason May 05, 2020.  She tolerated surgery well, and is eating well.  The drainage tube on the right side breast was removed earlier this week, and she is scheduled to follow-up with Dr. TIran PlanasLater today.  She did stop tamoxifen before surgery.  She recently also saw her gynecologist, and plan to have bilateral oophorectomy next year.   All other systems were reviewed with the patient and are negative.  MEDICAL HISTORY:  Past Medical History:  Diagnosis Date  . Breast cancer ([facility] 2020   Left Mastectomy  . [place]    Left breast Ca  . GERD (gastroesophageal reflux disease)   . Personal history of chemotherapy 08/2019-11/2019  . Scoliosis   . Sickle cell trait ([place]     SURGICAL HISTORY: Past Surgical History:  Procedure Laterality Date  . BREAST RECONSTRUCTION WITH PLACEMENT OF TISSUE EXPANDER AND ALLODERM Left 07/13/2019   Procedure: LEFT BREAST RECONSTRUCTION WITH PLACEMENT OF TISSUE EXPANDER AND ALLODERM;  Surgeon: TIrene Limbo MD;  [facility]  Service: Plastics;  Laterality: Left;  . DEBRIDEMENT AND CLOSURE WOUND Left 07/30/2019   Procedure: DEBRIDEMENT LEFT MASTECTOMY FLAP;  Surgeon: TIrene Limbo MD;  [place] [facility]  Service: Plastics;  Laterality:  Left;  . INDUCED ABORTION  2017  . LIPOSUCTION WITH LIPOFILLING Right 05/05/2020   Procedure: LIPOFILLING FROM ABDOMEN TO RIGHT CHEST;  Surgeon: TIrene Limbo MD;  [facility] [facility]  Service: Plastics;  Laterality: Right;  . MASTECTOMY Left 07/13/2019  . MASTOPEXY Right 05/05/2020   Procedure: RIGHT BREAST MASTOPEXY;  Surgeon: TIrene Limbo MD;  [place] [facility]  Service: Plastics;  Laterality: Right;  . NIPPLE SPARING MASTECTOMY WITH SENTINEL LYMPH NODE BIOPSY Left 07/13/2019   Procedure: LEFT NIPPLE SPARING MASTECTOMY WITH SENTINEL LYMPH NODE BIOPSY;  Surgeon: TDonnie Mesa MD;  [place] [facility]  Service: General;  Laterality: Left;  . PORTACATH PLACEMENT Right 09/01/2019   Procedure: INSERTION PORT-A-CATH WITH ULTRASOUND GUIDANCE;  Surgeon: TDonnie Mesa MD;  [place] [facility]  Service: General;  Laterality: Right;  . REMOVAL OF TISSUE EXPANDER AND PLACEMENT OF IMPLANT Left 05/05/2020   Procedure: REMOVAL OF LEFT CHEST TISSUE EXPANDER AND PLACEMENT OF IMPLANT;  Surgeon: TIrene Limbo MD;  [place] [facility]  Service: Plastics;  Laterality: Left;  .  TISSUE EXPANDER PLACEMENT Left 07/30/2019   Procedure: TISSUE EXPANSION LEFT CHEST;  Surgeon: Irene Limbo, MD;  Location: [facility];  Service: Plastics;  Laterality: Left;    I have reviewed the social history and family history with the patient and they are unchanged from previous note.  ALLERGIES:  is allergic to bactrim [sulfamethoxazole-trimethoprim].  MEDICATIONS:  Current Outpatient Medications  Medication Sig Dispense Refill  . dexlansoprazole (DEXILANT) 60 MG capsule Take 60 mg by mouth daily.    . Lactobacillus-Inulin ([place] PO) Take 1 capsule by mouth daily as needed (digestive health.).     Marland Kitchen lidocaine-prilocaine (EMLA) cream Apply 1 application topically as needed. 30 g 0  . Multiple Vitamin  (MULTIVITAMIN WITH MINERALS) TABS tablet Take 1 tablet by mouth 3 (three) times a week. WOMEN'S ONE-A-DAY    . ondansetron (ZOFRAN) 8 MG tablet Take 1 tablet (8 mg total) by mouth 2 (two) times daily as needed for refractory nausea / vomiting. Start on day 3 after chemo. 30 tablet 1  . prochlorperazine (COMPAZINE) 10 MG tablet Take 1 tablet (10 mg total) by mouth every 6 (six) hours as needed (Nausea or vomiting). 30 tablet 1  . tamoxifen (NOLVADEX) 20 MG tablet TAKE 1 TABLET BY MOUTH EVERY DAY 90 tablet 1  . venlafaxine XR (EFFEXOR-XR) 37.5 MG 24 hr capsule Take 1 capsule (37.5 mg total) by mouth daily with breakfast. 30 capsule 3   No current facility-administered medications for this visit.   Facility-Administered Medications Ordered in Other Visits  Medication Dose Route Frequency Provider Last Rate Last Admin  . alteplase (CATHFLO ACTIVASE) injection 2 mg  2 mg Intracatheter Once PRN Victoria Merle, MD      . heparin lock flush 100 unit/mL  500 Units Intracatheter Once PRN Victoria Merle, MD      . heparin lock flush 100 unit/mL  250 Units Intracatheter Once PRN Victoria Merle, MD      . sodium chloride flush (NS) 0.9 % injection 10 mL  10 mL Intracatheter PRN Victoria Merle, MD      . sodium chloride flush (NS) 0.9 % injection 3 mL  3 mL Intracatheter PRN Victoria Merle, MD        PHYSICAL EXAMINATION: ECOG PERFORMANCE STATUS: 1 - Symptomatic but completely ambulatory  Vitals:   05/11/20 0900  BP: 118/69  Pulse: 89  Resp: 18  Temp: (!) 97.4 F (36.3 C)  SpO2: 100%   Filed Weights   05/11/20 0900  Weight: 137 lb 11.2 oz (62.5 kg)    GENERAL:alert, no distress and comfortable SKIN: skin color, texture, turgor are normal, no rashes or significant lesions EYES: normal, Conjunctiva are pink and non-injected, sclera clear NECK: supple, thyroid normal size, non-tender, without nodularity LYMPH:  no palpable lymphadenopathy in the cervical, axillary  LUNGS: clear to auscultation and percussion with  normal breathing effort HEART: regular rate & rhythm and no murmurs and no lower extremity edema ABDOMEN:abdomen soft, non-tender and normal bowel sounds Musculoskeletal:no cyanosis of digits and no clubbing  NEURO: alert & oriented x 3 with fluent speech, no focal motor/sensory deficits Breast exam deferred today  LABORATORY DATA:  I have reviewed the data as listed CBC Latest Ref Rng & Units 05/11/2020 03/09/2020 01/06/2020  WBC 4.0 - 10.5 K/uL 5.7 5.1 5.0  Hemoglobin 12.0 - 15.0 g/dL 10.8(L) 12.6 12.1  Hematocrit 36 - 46 % 30.9(L) 37.1 36.5  Platelets 150 - 400 K/uL 197 191 185     CMP Latest  Ref Rng & Units 05/11/2020 03/09/2020 01/06/2020  Glucose 70 - 99 mg/dL 125(H) 86 91  BUN 6 - 20 mg/dL _0 Creatinine 0.44 - 1.00 mg/dL 0.77 0.79 0.79  Sodium 135 - 145 mmol/L 138 140 140  Potassium 3.5 - 5.1 mmol/L 3.9 4.0 3.8  Chloride 98 - 111 mmol/L 105 107 104  CO2 22 - 32 mmol/L _1 Calcium 8.9 - 10.3 mg/dL 8.8(L) 8.8(L) 8.9  Total Protein 6.5 - 8.1 g/dL 6.5 6.8 6.5  Total Bilirubin 0.3 - 1.2 mg/dL 0.5 0.4 <0.2(L)  Alkaline Phos 38 - 126 U/L 54 69 58  AST 15 - 41 U/L _2 ALT 0 - 44 U/L _3 RADIOGRAPHIC STUDIES: I have personally reviewed the radiological images as listed and agreed with the findings in the report. No results found.   ASSESSMENT & PLAN:  Victoria Barker is a 39 y.o. female with   1.Malignant neoplasm of upper-outer quadrant of left breast,invasive ductal carcinoma and DCIS, stageIIA,pT2N0M0, ER+/PR-/HER2+, GradeIII -She was diagnosed in 04/2019. She was found to have2 left breast masses with DCIS and 1 mass with Invasive ductal carcinoma, her abnormal lymph node was biopsied and found to be negative. -On 07/13/19 she underwent left breast mastectomy with Dr. Prince Solian and reconstruction with Dr. Iran Planas.Tumorwas found to be HER2 positiveonresected sample and Oncotypetesting(ordered before HER2 came back positive)withrecurrence  score of 56. -To reduce her high risk of recurrenceshe completed 6 cycles ofadjuvant chemotherapy with [facility] (09/02/19-12/16/19). She will continue withmaintenance Herceptinq3weeks starting 7/15/21to complete one year therapyuntil 08/2020 -Given node negativedisease andmastectomy, Dr Isidore Moos did not recommend adjuvant radiation.  -She restarted Tamoxifen in 12/2019.  She is tolerating better now, much less hot flushes after she started Effexor -We previously discussed ovarian suppression. She does not plan to have more children.  I encouraged her to consider bilateral oophorectomy, and switch tamoxifen to aromatase inhibitor, which will likely reduce her risk of future breast cancer recurrence.  She has recently seen her gynecologist, BSO next year. We discussed ovarian suppression with Zoladex injection before BSO, she agrees -Continue trastuzumab maintenance therapy every 3 weeks, follow-up in 9 weeks -She is recovering well from surgery, restart tamoxifen in 1-2 weeks    2. Nicotine use, Smoking Cessation  -She has quit smoking completely  3. Hot Flashes, chemo-induced -She has had worsened hot flashes day and night which are effecting her sleep.  -I started her on Effexor in 12/2019. Her hot flushes has much improved. -Will watch on Tamoxifen.   4. Genetic testing negative for pathogenetic mutations.    PLAN: -Labs reviewed and adequate to proceed with trastuzumab today and continue every 3 weeks   -She will restart tamoxifen in 1 to 2 weeks, after Thanksgiving holiday -Lab and follow-up in 9 weeks, will likely start Zoladex injection on next visit, before her BSO next year   No problem-specific Assessment & Plan notes found for this encounter.   No orders of the defined types were placed in this encounter.  All questions were answered. The patient knows to call the clinic with any problems, questions or concerns. No barriers to learning was detected. The total time spent  in the appointment was 30 minutes.     Victoria Merle, MD 05/11/2020   I, Joslyn Devon, am acting as scribe for Victoria Merle, MD.   I have reviewed the above documentation for accuracy and completeness, and I agree with the above.

## 2020-05-10 ENCOUNTER — Encounter (HOSPITAL_BASED_OUTPATIENT_CLINIC_OR_DEPARTMENT_OTHER): Payer: Self-pay | Admitting: Plastic Surgery

## 2020-05-11 ENCOUNTER — Encounter: Payer: Self-pay | Admitting: Hematology

## 2020-05-11 ENCOUNTER — Inpatient Hospital Stay: Payer: BC Managed Care – PPO

## 2020-05-11 ENCOUNTER — Encounter: Payer: Self-pay | Admitting: *Deleted

## 2020-05-11 ENCOUNTER — Other Ambulatory Visit: Payer: Self-pay

## 2020-05-11 ENCOUNTER — Inpatient Hospital Stay (HOSPITAL_BASED_OUTPATIENT_CLINIC_OR_DEPARTMENT_OTHER): Payer: BC Managed Care – PPO | Admitting: Hematology

## 2020-05-11 ENCOUNTER — Inpatient Hospital Stay: Payer: BC Managed Care – PPO | Attending: Hematology

## 2020-05-11 VITALS — BP 118/69 | HR 89 | Temp 97.4°F | Resp 18 | Ht 62.0 in | Wt 137.7 lb

## 2020-05-11 DIAGNOSIS — Z95828 Presence of other vascular implants and grafts: Secondary | ICD-10-CM

## 2020-05-11 DIAGNOSIS — C50412 Malignant neoplasm of upper-outer quadrant of left female breast: Secondary | ICD-10-CM

## 2020-05-11 DIAGNOSIS — Z5112 Encounter for antineoplastic immunotherapy: Secondary | ICD-10-CM | POA: Diagnosis not present

## 2020-05-11 DIAGNOSIS — C50912 Malignant neoplasm of unspecified site of left female breast: Secondary | ICD-10-CM

## 2020-05-11 DIAGNOSIS — Z170 Estrogen receptor positive status [ER+]: Secondary | ICD-10-CM

## 2020-05-11 LAB — CMP (CANCER CENTER ONLY)
ALT: 8 U/L (ref 0–44)
AST: 15 U/L (ref 15–41)
Albumin: 3.4 g/dL — ABNORMAL LOW (ref 3.5–5.0)
Alkaline Phosphatase: 54 U/L (ref 38–126)
Anion gap: 9 (ref 5–15)
BUN: 13 mg/dL (ref 6–20)
CO2: 24 mmol/L (ref 22–32)
Calcium: 8.8 mg/dL — ABNORMAL LOW (ref 8.9–10.3)
Chloride: 105 mmol/L (ref 98–111)
Creatinine: 0.77 mg/dL (ref 0.44–1.00)
GFR, Estimated: >60 mL/min (ref >60)
Glucose, Bld: 125 mg/dL — ABNORMAL HIGH (ref 70–99)
Potassium: 3.9 mmol/L (ref 3.5–5.1)
Sodium: 138 mmol/L (ref 135–145)
Total Bilirubin: 0.5 mg/dL (ref 0.3–1.2)
Total Protein: 6.5 g/dL (ref 6.5–8.1)

## 2020-05-11 LAB — CBC WITH DIFFERENTIAL (CANCER CENTER ONLY)
Abs Immature Granulocytes: 0.01 10*3/uL (ref 0.00–0.07)
Basophils Absolute: 0.1 10*3/uL (ref 0.0–0.1)
Basophils Relative: 1 %
Eosinophils Absolute: 0.1 10*3/uL (ref 0.0–0.5)
Eosinophils Relative: 2 %
HCT: 30.9 % — ABNORMAL LOW (ref 36.0–46.0)
Hemoglobin: 10.8 g/dL — ABNORMAL LOW (ref 12.0–15.0)
Immature Granulocytes: 0 %
Lymphocytes Relative: 35 %
Lymphs Abs: 2 10*3/uL (ref 0.7–4.0)
MCH: 30.5 pg (ref 26.0–34.0)
MCHC: 35 g/dL (ref 30.0–36.0)
MCV: 87.3 fL (ref 80.0–100.0)
Monocytes Absolute: 0.3 10*3/uL (ref 0.1–1.0)
Monocytes Relative: 6 %
Neutro Abs: 3.2 10*3/uL (ref 1.7–7.7)
Neutrophils Relative %: 56 %
Platelet Count: 197 10*3/uL (ref 150–400)
RBC: 3.54 MIL/uL — ABNORMAL LOW (ref 3.87–5.11)
RDW: 13.3 % (ref 11.5–15.5)
WBC Count: 5.7 10*3/uL (ref 4.0–10.5)
nRBC: 0 % (ref 0.0–0.2)

## 2020-05-11 MED ORDER — ACETAMINOPHEN 325 MG PO TABS
650.0000 mg | ORAL_TABLET | Freq: Once | ORAL | Status: AC
  Administered 2020-05-11: 650 mg via ORAL

## 2020-05-11 MED ORDER — DIPHENHYDRAMINE HCL 25 MG PO CAPS
25.0000 mg | ORAL_CAPSULE | Freq: Once | ORAL | Status: AC
  Administered 2020-05-11: 25 mg via ORAL

## 2020-05-11 MED ORDER — HEPARIN SOD (PORK) LOCK FLUSH 100 UNIT/ML IV SOLN
250.0000 [IU] | Freq: Once | INTRAVENOUS | Status: DC | PRN
  Filled 2020-05-11: qty 5

## 2020-05-11 MED ORDER — PROCHLORPERAZINE MALEATE 10 MG PO TABS
10.0000 mg | ORAL_TABLET | Freq: Once | ORAL | Status: AC
  Administered 2020-05-11: 10 mg via ORAL

## 2020-05-11 MED ORDER — ALTEPLASE 2 MG IJ SOLR
2.0000 mg | Freq: Once | INTRAMUSCULAR | Status: DC | PRN
  Filled 2020-05-11: qty 2

## 2020-05-11 MED ORDER — ACETAMINOPHEN 325 MG PO TABS
ORAL_TABLET | ORAL | Status: AC
  Filled 2020-05-11: qty 2

## 2020-05-11 MED ORDER — HEPARIN SOD (PORK) LOCK FLUSH 100 UNIT/ML IV SOLN
500.0000 [IU] | Freq: Once | INTRAVENOUS | Status: DC | PRN
  Filled 2020-05-11: qty 5

## 2020-05-11 MED ORDER — TRASTUZUMAB-DKST CHEMO 150 MG IV SOLR
6.0000 mg/kg | Freq: Once | INTRAVENOUS | Status: AC
  Administered 2020-05-11: 357 mg via INTRAVENOUS
  Filled 2020-05-11: qty 17

## 2020-05-11 MED ORDER — SODIUM CHLORIDE 0.9 % IV SOLN
Freq: Once | INTRAVENOUS | Status: AC
  Filled 2020-05-11: qty 250

## 2020-05-11 MED ORDER — SODIUM CHLORIDE 0.9% FLUSH
10.0000 mL | Freq: Once | INTRAVENOUS | Status: AC
  Administered 2020-05-11: 10 mL
  Filled 2020-05-11: qty 10

## 2020-05-11 MED ORDER — DIPHENHYDRAMINE HCL 25 MG PO CAPS
ORAL_CAPSULE | ORAL | Status: AC
  Filled 2020-05-11: qty 1

## 2020-05-11 MED ORDER — SODIUM CHLORIDE 0.9% FLUSH
3.0000 mL | INTRAVENOUS | Status: DC | PRN
  Filled 2020-05-11: qty 10

## 2020-05-11 MED ORDER — SODIUM CHLORIDE 0.9% FLUSH
10.0000 mL | INTRAVENOUS | Status: DC | PRN
  Filled 2020-05-11: qty 10

## 2020-05-11 MED ORDER — PROCHLORPERAZINE MALEATE 10 MG PO TABS
ORAL_TABLET | ORAL | Status: AC
  Filled 2020-05-11: qty 1

## 2020-05-11 NOTE — Patient Instructions (Signed)

## 2020-05-11 NOTE — Patient Instructions (Signed)
[facility] Discharge Instructions for Patients Receiving Chemotherapy  Today you received the following chemotherapy agents trastuzumab.  To help prevent nausea and vomiting after your treatment, we encourage you to take your nausea medication as directed.    If you develop nausea and vomiting that is not controlled by your nausea medication, call the clinic.   BELOW ARE SYMPTOMS THAT SHOULD BE REPORTED IMMEDIATELY:  *FEVER GREATER THAN 100.5 F  *CHILLS WITH OR WITHOUT FEVER  NAUSEA AND VOMITING THAT IS NOT CONTROLLED WITH YOUR NAUSEA MEDICATION  *UNUSUAL SHORTNESS OF BREATH  *UNUSUAL BRUISING OR BLEEDING  TENDERNESS IN MOUTH AND THROAT WITH OR WITHOUT PRESENCE OF ULCERS  *URINARY PROBLEMS  *BOWEL PROBLEMS  UNUSUAL RASH Items with * indicate a potential emergency and should be followed up as soon as possible.  Feel free to call the clinic should you have any questions or concerns. The clinic phone number is [phone].  Please show the CHEMO ALERT CARD at check-in to the Emergency Department and triage nurse.   

## 2020-05-11 NOTE — Progress Notes (Signed)
Ok to treat today without urine pregnancy test per Dr. Burr Medico

## 2020-05-12 ENCOUNTER — Telehealth: Payer: Self-pay | Admitting: Hematology

## 2020-05-12 NOTE — Telephone Encounter (Signed)
Scheduled per 11/18 los. Pt is aware of appt times and dates.

## 2020-05-15 ENCOUNTER — Encounter: Payer: Self-pay | Admitting: *Deleted

## 2020-06-01 ENCOUNTER — Inpatient Hospital Stay (HOSPITAL_BASED_OUTPATIENT_CLINIC_OR_DEPARTMENT_OTHER): Payer: BC Managed Care – PPO | Admitting: Hematology

## 2020-06-01 ENCOUNTER — Other Ambulatory Visit: Payer: Self-pay

## 2020-06-01 ENCOUNTER — Inpatient Hospital Stay: Payer: BC Managed Care – PPO | Attending: Hematology

## 2020-06-01 VITALS — BP 122/90 | HR 80 | Temp 98.3°F | Resp 20 | Wt 133.0 lb

## 2020-06-01 DIAGNOSIS — Z5112 Encounter for antineoplastic immunotherapy: Secondary | ICD-10-CM | POA: Insufficient documentation

## 2020-06-01 DIAGNOSIS — Z79899 Other long term (current) drug therapy: Secondary | ICD-10-CM | POA: Insufficient documentation

## 2020-06-01 DIAGNOSIS — Z170 Estrogen receptor positive status [ER+]: Secondary | ICD-10-CM

## 2020-06-01 DIAGNOSIS — C50412 Malignant neoplasm of upper-outer quadrant of left female breast: Secondary | ICD-10-CM | POA: Diagnosis present

## 2020-06-01 DIAGNOSIS — C50912 Malignant neoplasm of unspecified site of left female breast: Secondary | ICD-10-CM

## 2020-06-01 MED ORDER — SODIUM CHLORIDE 0.9 % IV SOLN
Freq: Once | INTRAVENOUS | Status: AC
  Filled 2020-06-01: qty 250

## 2020-06-01 MED ORDER — TRASTUZUMAB-DKST CHEMO 150 MG IV SOLR
6.0000 mg/kg | Freq: Once | INTRAVENOUS | Status: AC
  Administered 2020-06-01: 357 mg via INTRAVENOUS
  Filled 2020-06-01: qty 17

## 2020-06-01 MED ORDER — DIPHENHYDRAMINE HCL 25 MG PO CAPS
25.0000 mg | ORAL_CAPSULE | Freq: Once | ORAL | Status: AC
  Administered 2020-06-01: 25 mg via ORAL

## 2020-06-01 MED ORDER — ACETAMINOPHEN 325 MG PO TABS
650.0000 mg | ORAL_TABLET | Freq: Once | ORAL | Status: AC
  Administered 2020-06-01: 650 mg via ORAL

## 2020-06-01 MED ORDER — PROCHLORPERAZINE MALEATE 10 MG PO TABS
10.0000 mg | ORAL_TABLET | Freq: Once | ORAL | Status: AC
  Administered 2020-06-01: 10 mg via ORAL

## 2020-06-01 MED ORDER — SODIUM CHLORIDE 0.9% FLUSH
10.0000 mL | INTRAVENOUS | Status: DC | PRN
  Administered 2020-06-01: 10 mL
  Filled 2020-06-01: qty 10

## 2020-06-01 MED ORDER — HEPARIN SOD (PORK) LOCK FLUSH 100 UNIT/ML IV SOLN
500.0000 [IU] | Freq: Once | INTRAVENOUS | Status: AC | PRN
  Administered 2020-06-01: 500 [IU]
  Filled 2020-06-01: qty 5

## 2020-06-01 NOTE — Progress Notes (Signed)
Per Dr. Burr Medico, urine pregnancy not needed for treatment. Pt. states no possibility she is pregnant.

## 2020-06-01 NOTE — Patient Instructions (Signed)
[facility] Discharge Instructions for Patients Receiving Chemotherapy  Today you received the following immunotherapy treatment: Trastuzumab Madalyn Rob)  To help prevent nausea and vomiting after your treatment, we encourage you to take your nausea medication as directed by your MD.   If you develop nausea and vomiting that is not controlled by your nausea medication, call the clinic.   BELOW ARE SYMPTOMS THAT SHOULD BE REPORTED IMMEDIATELY:  *FEVER GREATER THAN 100.5 F  *CHILLS WITH OR WITHOUT FEVER  NAUSEA AND VOMITING THAT IS NOT CONTROLLED WITH YOUR NAUSEA MEDICATION  *UNUSUAL SHORTNESS OF BREATH  *UNUSUAL BRUISING OR BLEEDING  TENDERNESS IN MOUTH AND THROAT WITH OR WITHOUT PRESENCE OF ULCERS  *URINARY PROBLEMS  *BOWEL PROBLEMS  UNUSUAL RASH Items with * indicate a potential emergency and should be followed up as soon as possible.  Feel free to call the clinic should you have any questions or concerns. The clinic phone number is [phone].  Please show the [place] at check-in to the Emergency Department and triage nurse.

## 2020-06-01 NOTE — Progress Notes (Signed)
Pt. stable for discharge. Left via ambulation, no respiratory distress noted. 

## 2020-06-02 ENCOUNTER — Telehealth: Payer: Self-pay | Admitting: Hematology

## 2020-06-02 ENCOUNTER — Encounter: Payer: Self-pay | Admitting: Hematology

## 2020-06-02 NOTE — Telephone Encounter (Signed)
Scheduled appts per 12/9 los. Pt confirmed appt date and time.

## 2020-06-02 NOTE — Progress Notes (Signed)
[place]   [phone] [phone]   Clinic Follow up Note   Patient Care Team: [name], MD as PCP - General (Obstetrics and Gynecology) Mauro Kaufmann, RN as Oncology Nurse Navigator Rockwell Germany, RN as Oncology Nurse Navigator Donnie Mesa, MD as Consulting Physician (General Surgery) Truitt Merle, MD as Consulting Physician (Hematology) Eppie Gibson, MD as Attending Physician (Radiation Oncology)  Date of Service:  06/02/2020  CHIEF COMPLAINT: F/u of left breast cancer  SUMMARY OF ONCOLOGIC HISTORY: Oncology History Overview Note  Cancer Staging Malignant neoplasm of upper-outer quadrant of left breast in female, estrogen receptor positive ([place]) Staging form: Breast, AJCC 8th Edition - Clinical stage from 04/28/2019: Stage IIA (cT2, cN0, cM0, G2, ER+, PR-, HER2: Equivocal) - Signed by Truitt Merle, MD on 05/04/2019 - Pathologic stage from 07/13/2019: Stage IIA (pT2, pN0, cM0, G3, ER+, PR-, HER2+, Oncotype DX score: 56) - Signed by Truitt Merle, MD on 08/20/2019    Malignant neoplasm of upper-outer quadrant of left breast in female, estrogen receptor positive ([place])  04/21/2019 Mammogram   Diagnostic mammogram 04/21/19  IMPRESSION 1. findings highly suspicious for left breast cancer. There is a palpable irregular mass 4cm from nipple measuring 2.4x2.4x1.8cm in the 1:00 position axis of the left breast and pleomorphic  calcifications in the upper-outer quadrant as described above.  2. single left 0.9x0.5cm axillary lymph node with a mild/borderline thickened cortex.    04/28/2019 Cancer Staging   Staging form: Breast, AJCC 8th Edition - Clinical stage from 04/28/2019: Stage IIA (cT2, cN0, cM0, G2, ER+, PR-, HER2: Equivocal) - Signed by Truitt Merle, MD on 05/04/2019   04/28/2019 Initial Biopsy   Diagnosis 04/28/19 1. Breast, left, needle core biopsy, 1 o'clock position - INVASIVE DUCTAL CARCINOMA. - DUCTAL CARCINOMA IN SITU. - SEE  COMMENT. 2. Lymph node, needle/core biopsy, left axilla - THERE IS NO EVIDENCE OF CARCINOMA IN 1 OF 1 LYMPH NODE (0/1). 3. Breast, left, needle core biopsy, upper, outer quadrant - DUCTAL CARCINOMA IN SITU WITH CALCIFICATIONS. - SEE COMMENT.   04/28/2019 Receptors her2   1. PROGNOSTIC INDICATORS Results: HER2 negative  Estrogen Receptor: 80%, POSITIVE, STRONG STAINING INTENSITY Progesterone Receptor: 0%, NEGATIVE Proliferation Marker Ki67: 35%    04/30/2019 Initial Diagnosis   Malignant neoplasm of upper-outer quadrant of left breast in female, estrogen receptor positive ([name])    Genetic Testing   No pathogenic variants identified. 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 The final report date is 05/28/2019.  The STAT Breast cancer panel offered by [facility] includes sequencing and rearrangement analysis for the following 9 genes:  ATM, BRCA1, BRCA2, CDH1, CHEK2, PALB2, PTEN, STK11 and TP53.    The Common Hereditary Cancers Panel offered by [facility] includes sequencing and/or deletion duplication testing of the following 48 genes: APC, ATM, AXIN2, BARD1, BMPR1A, BRCA1, BRCA2, BRIP1, CDH1, CDKN2A (p14ARF), CDKN2A (p16INK4a), CKD4, CHEK2, CTNNA1, DICER1, EPCAM (Deletion/duplication testing only), GREM1 (promoter region deletion/duplication testing only), KIT, MEN1, MLH1, MSH2, MSH3, MSH6, MUTYH, NBN, NF1, NHTL1, PALB2, PDGFRA, PMS2, POLD1, POLE, PTEN, RAD50, RAD51C, RAD51D, RNF43, SDHB, SDHC, SDHD, SMAD4, SMARCA4. STK11, TP53, TSC1, TSC2, and VHL.  The following genes were evaluated for sequence changes only: SDHA and HOXB13 c.251G>A variant only.   05/11/2019 Breast MRI   IMPRESSION: 1. Enhancing mass and architectural distortion spanning approximately 6 x 3 x 5.3 cm in the upper outer left breast consistent with the patient's biopsy-proven site of invasive  cancer. 2. Biopsy changes in the anterior left breast at the  site of the patient's biopsy-proven ductal carcinoma in situ. 3. Suspicious changes within the upper-outer quadrant of the left breast spanning a total of 8 cm in the AP dimension. This includes the site of biopsy-proven DCIS anteriorly and suspicious enhancement extending to the level of the pectoralis muscle posteriorly. 4. Suspicious 7 mm enhancing mass in the far posterior slightly lateral left breast (image 138/212). 5. No MRI evidence of malignancy on the right. 6. No suspicious lymphadenopathy.     05/26/2019 -  Anti-estrogen oral therapy   Tamoxifen 92m daily starting 05/26/19. Stopped before surgery and chemo on 07/13/19. Restart in 12/2019.    07/13/2019 Surgery   LEFT NIPPLE SPARING MASTECTOMY WITH SENTINEL LYMPH NODE BIOPSY and LEFT BREAST RECONSTRUCTION WITH PLACEMENT OF TISSUE EXPANDER AND ALLODERM by D.r Tsuie and Dr TIran Planas    07/13/2019 Pathology Results   SURGICAL PATHOLOGY   FINAL MICROSCOPIC DIAGNOSIS:   A. LYMPH NODE, LEFT AXILLARY #1, SENTINEL, BIOPSY:  - One of one lymph nodes negative for carcinoma (0/1).   B. BREAST, LEFT, MASTECTOMY:  - Invasive ductal carcinoma, grade 3, spanning 3.4 cm.  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  - Invasive carcinoma is 0.2 cm from the posterior margin focally (not on  ink) and 0.4 cm from the  anterior margin focally (not on ink).  - In situ carcinoma is 0.4 cm from the posterior margin focally (not on  ink).  - Biopsy site x2.  - See oncology table.   C. NIPPLE, LEFT, BIOPSY:  - Benign nipple tissue.   D. LYMPH NODE, LEFT AXILLARY #2, SENTINEL, BIOPSY:  - One of one lymph nodes negative for carcinoma (0/1).     GROUP 1:  HER2 **POSITIVE**    07/13/2019 Oncotype testing   Ocotype score 56 with 39% risk of recurrence with Tmaoxifen or AI alone. There is a 15% benefit from adjuvant chemo.     07/13/2019 Cancer Staging   Staging form: Breast, AJCC 8th Edition - Pathologic stage from 07/13/2019:  Stage IIA (pT2, pN0, cM0, G3, ER+, PR-, HER2+, Oncotype DX score: 56) - Signed by FTruitt Merle MD on 08/20/2019   08/25/2019 Echocardiogram   Baseline Echo  IMPRESSIONS     1. Left ventricular ejection fraction, by estimation, is 65 to 70%. The  left ventricle has normal function. The left ventricle has no regional  wall motion abnormalities. Left ventricular diastolic parameters were  normal.   2. Right ventricular systolic function is normal. The right ventricular  size is normal. There is normal pulmonary artery systolic pressure.   3. Left atrial size was mildly dilated.   4. The mitral valve is normal in structure and function. Trivial mitral  valve regurgitation.   5. The aortic valve is tricuspid. Aortic valve regurgitation is not  visualized.   6. The inferior vena cava is normal in size with greater than 50%  respiratory variability, suggesting right atrial pressure of 3 mmHg.   7. The aortic valve is not well seen. It is trileaflet. There appears to  be some calcification associated with the non-coronary cusp but this is  not well seen.    08/25/2019 Imaging   Whole body Bone Scan  IMPRESSION: No significant abnormality identified.   08/25/2019 Imaging   CT CAP w Contrast  IMPRESSION: 1. No findings of metastatic disease to the chest, abdomen, or pelvis. 2. 0.7 by 0.3 cm sclerotic lesion in the right  seventh rib laterally is likely benign but merits surveillance. 3. Mild sclerosis along the sacroiliac joints bilaterally, query mild chronic sacroiliitis.     09/01/2019 Procedure   INSERTION PORT-A-CATH WITH ULTRASOUND GUIDANCE by Dr. Georgette Dover   09/02/2019 -  Chemotherapy   TCH (Taxol, Carboplatin, Herceptin) q3weeks for 6 cycles starting 09/02/19-12/16/19, followed by anti-HER2 maintenance therapy with Herceptin q3weeks starting 01/06/20 to complete 1 year of treatment (from 08/2019)     05/05/2020 Surgery   REMOVAL OF LEFT CHEST TISSUE EXPANDER AND PLACEMENT OF IMPLANT and  RIGHT BREAST MASTOPEXY and LIPOFILLING FROM ABDOMEN TO RIGHT CHEST by Dr Iran Planas   Invasive ductal carcinoma of breast, female, left ([place])  07/13/2019 Initial Diagnosis   Invasive ductal carcinoma of breast, female, left ([place])      CURRENT THERAPY:  -Tamoxifen $RemoveBef'20mg'cgHLCJSGvx$  daily starting 05/26/19. Stopped before surgery and chemo on 07/13/19. Restart in 12/2019. [facility] q[facility]weeks for 6 cycles starting 09/02/19-12/16/19, followed by maintenance Herceptinstarting 7/15/21to complete one year therapy(from 08/2019).  INTERVAL HISTORY:  Victoria Barker is here for trastuzumab infusion.  I saw her in the infusion room due to her new concerns.  She restarted tamoxifen a few weeks ago, and has noticed intermittent nausea, fatigue and insomnia, hot flashes intolerable.  She also noticed vaginal spotting for past two weeks, and lower abdominal cramps which is similar to she used to have with menstrual period.   All other systems were reviewed with the patient and are negative.  MEDICAL HISTORY:  Past Medical History:  Diagnosis Date  . Breast cancer ([place]) 2020   Left Mastectomy  . Cancer ([place])    Left breast Ca  . GERD (gastroesophageal reflux disease)   . Personal history of chemotherapy 08/2019-11/2019  . Scoliosis   . Sickle cell trait ([place])     SURGICAL HISTORY: Past Surgical History:  Procedure Laterality Date  . BREAST RECONSTRUCTION WITH PLACEMENT OF TISSUE EXPANDER AND ALLODERM Left 07/13/2019   Procedure: LEFT BREAST RECONSTRUCTION WITH PLACEMENT OF TISSUE EXPANDER AND ALLODERM;  Surgeon: Irene Limbo, MD;  Location: [place];  Service: Plastics;  Laterality: Left;  . DEBRIDEMENT AND CLOSURE WOUND Left 07/30/2019   Procedure: DEBRIDEMENT LEFT MASTECTOMY FLAP;  Surgeon: Irene Limbo, MD;  Location: [place];  Service: Plastics;  Laterality: Left;  . INDUCED ABORTION  2017  . LIPOSUCTION WITH LIPOFILLING Right 05/05/2020   Procedure: LIPOFILLING FROM ABDOMEN TO RIGHT  CHEST;  Surgeon: Irene Limbo, MD;  Location: [facility];  Service: Plastics;  Laterality: Right;  . MASTECTOMY Left 07/13/2019  . MASTOPEXY Right 05/05/2020   Procedure: RIGHT BREAST MASTOPEXY;  Surgeon: Irene Limbo, MD;  Location: [place];  Service: Plastics;  Laterality: Right;  . NIPPLE SPARING MASTECTOMY WITH SENTINEL LYMPH NODE BIOPSY Left 07/13/2019   Procedure: LEFT NIPPLE SPARING MASTECTOMY WITH SENTINEL LYMPH NODE BIOPSY;  Surgeon: Donnie Mesa, MD;  Location: [facility];  Service: General;  Laterality: Left;  . PORTACATH PLACEMENT Right 09/01/2019   Procedure: INSERTION PORT-A-CATH WITH ULTRASOUND GUIDANCE;  Surgeon: Donnie Mesa, MD;  Location: [place];  Service: General;  Laterality: Right;  . REMOVAL OF TISSUE EXPANDER AND PLACEMENT OF IMPLANT Left 05/05/2020   Procedure: REMOVAL OF LEFT CHEST TISSUE EXPANDER AND PLACEMENT OF IMPLANT;  Surgeon: Irene Limbo, MD;  Location: [place];  Service: Plastics;  Laterality: Left;  . TISSUE EXPANDER PLACEMENT Left 07/30/2019   Procedure: TISSUE EXPANSION LEFT CHEST;  Surgeon: Irene Limbo, MD;  Location: [facility];  Service: Clinical cytogeneticist;  Laterality: Left;    I have reviewed the social history and family history with the patient and they are unchanged from previous note.  ALLERGIES:  is allergic to bactrim [sulfamethoxazole-trimethoprim].  MEDICATIONS:  Current Outpatient Medications  Medication Sig Dispense Refill  . dexlansoprazole (DEXILANT) 60 MG capsule Take 60 mg by mouth daily.    . Lactobacillus-Inulin ([place] PO) Take 1 capsule by mouth daily as needed (digestive health.).     Marland Kitchen lidocaine-prilocaine (EMLA) cream Apply 1 application topically as needed. 30 g 0  . Multiple Vitamin (MULTIVITAMIN WITH MINERALS) TABS tablet Take 1 tablet by mouth 3 (three) times a week. WOMEN'S ONE-A-DAY    . ondansetron (ZOFRAN) 8 MG  tablet Take 1 tablet (8 mg total) by mouth 2 (two) times daily as needed for refractory nausea / vomiting. Start on day 3 after chemo. 30 tablet 1  . prochlorperazine (COMPAZINE) 10 MG tablet Take 1 tablet (10 mg total) by mouth every 6 (six) hours as needed (Nausea or vomiting). 30 tablet 1  . tamoxifen (NOLVADEX) 20 MG tablet TAKE 1 TABLET BY MOUTH EVERY DAY 90 tablet 1  . venlafaxine XR (EFFEXOR-XR) 37.5 MG 24 hr capsule Take 1 capsule (37.5 mg total) by mouth daily with breakfast. 30 capsule 3   No current facility-administered medications for this visit.    PHYSICAL EXAMINATION: ECOG PERFORMANCE STATUS: 1 - Symptomatic but completely ambulatory Blood pressure 122/90, heart rate 80, respirate 20, temperature 36.8, pulse ox 100% on room air GENERAL:alert, no distress and comfortable SKIN: skin color, texture, turgor are normal, no rashes or significant lesions EYES: normal, Conjunctiva are pink and non-injected, sclera clear Abdomen: soft and no tenderness Musculoskeletal:no cyanosis of digits and no clubbing  NEURO: alert & oriented x 3 with fluent speech, no focal motor/sensory deficits Breast exam deferred today  LABORATORY DATA:  I have reviewed the data as listed CBC Latest Ref Rng & Units 05/11/2020 03/09/2020 01/06/2020  WBC 4.0 - 10.5 K/uL 5.7 5.1 5.0  Hemoglobin 12.0 - 15.0 g/dL 10.8(L) 12.6 12.1  Hematocrit 36.0 - 46.0 % 30.9(L) 37.1 36.5  Platelets 150 - 400 K/uL 197 191 185     CMP Latest Ref Rng & Units 05/11/2020 03/09/2020 01/06/2020  Glucose 70 - 99 mg/dL 125(H) 86 91  BUN 6 - 20 mg/dL _0 Creatinine 0.44 - 1.00 mg/dL 0.77 0.79 0.79  Sodium 135 - 145 mmol/L 138 140 140  Potassium 3.5 - 5.1 mmol/L 3.9 4.0 3.8  Chloride 98 - 111 mmol/L 105 107 104  CO2 22 - 32 mmol/L _1 Calcium 8.9 - 10.3 mg/dL 8.8(L) 8.8(L) 8.9  Total Protein 6.5 - 8.1 g/dL 6.5 6.8 6.5  Total Bilirubin 0.3 - 1.2 mg/dL 0.5 0.4 <0.2(L)  Alkaline Phos 38 - 126 U/L 54 69 58  AST 15 - 41  U/L _2 ALT 0 - 44 U/L _3 RADIOGRAPHIC STUDIES: I have personally reviewed the radiological images as listed and agreed with the findings in the report. No results found.   ASSESSMENT & PLAN:  Victoria Barker is a 39 y.o. female with   1.Malignant neoplasm of upper-outer quadrant of left breast,invasive ductal carcinoma and DCIS, stageIIA,pT2N0M0, ER+/PR-/HER2+, GradeIII -She was diagnosed in 04/2019. She was found to have2 left breast masses with DCIS and 1 mass with Invasive ductal carcinoma, her abnormal lymph node was biopsied and found to be negative. -  On 07/13/19 she underwent left breast mastectomy with Dr. Prince Solian and reconstruction with Dr. Iran Planas.Tumorwas found to be HER2 positiveonresected sample and Oncotypetesting(ordered before HER2 came back positive)withrecurrence score of 56. -To reduce her high risk of recurrenceshe completed 6 cycles ofadjuvant chemotherapy with TCH (09/02/19-12/16/19). She will continue withmaintenance Herceptinq3weeks starting 7/15/21to complete one year therapyuntil 08/2020 -Given node negativedisease andmastectomy, Dr Isidore Moos did not recommend adjuvant radiation.  -She restarted Tamoxifen in 12/2019.  She was tolerating better after she started Effexor -She restarted tamoxifen a few weeks ago, has developed insomnia and intermittent nausea.  She takes along with Effexor, which I suggest to separate.  I also recommend her to take half of the tamoxifen tablet to see if she can tolerate better, then increase back to full tab daily in a week  -We again discussed ovarian suppression, she is open to it.  Plan to start in 2 weeks -I will see her back in 6 weeks, plan to change tamoxifen to anastrozole  2. Hot Flashes, chemo-induced -She has had worsened hot flashes day and night which are effecting her sleep.  -I started her on Effexor in 12/2019. Her hot flushes has much improved. -Will watch on Tamoxifen.   3.   Intermittent nausea, abdominal cramps and vaginal spotting -We will reduce tamoxifen for the next week, she will take antiemetics as needed -Plan to start ovarian suppression in 2 weeks  4. Genetic testing negative for pathogenetic mutations.    PLAN: -We will proceed trastuzumab infusion and continue every 3 weeks -She will reduce tamoxifen to 10 mg daily for the next week, and increased back to 20 mg a week later if she tolerates -Start Zoladex injection in 2 weeks, and continue every 4 weeks -Follow-up in 6 weeks, plan to change tamoxifen to anastrozole on next visit   No problem-specific Assessment & Plan notes found for this encounter.   No orders of the defined types were placed in this encounter.  All questions were answered. The patient knows to call the clinic with any problems, questions or concerns. No barriers to learning was detected. The total time spent in the appointment was 30 minutes.     Truitt Merle, MD 06/02/2020   I, Joslyn Devon, am acting as scribe for Truitt Merle, MD.   I have reviewed the above documentation for accuracy and completeness, and I agree with the above.

## 2020-06-15 ENCOUNTER — Other Ambulatory Visit: Payer: Self-pay

## 2020-06-15 ENCOUNTER — Inpatient Hospital Stay: Payer: BC Managed Care – PPO

## 2020-06-15 VITALS — BP 115/74 | HR 87 | Temp 96.6°F | Resp 17

## 2020-06-15 DIAGNOSIS — Z95828 Presence of other vascular implants and grafts: Secondary | ICD-10-CM

## 2020-06-15 DIAGNOSIS — Z5112 Encounter for antineoplastic immunotherapy: Secondary | ICD-10-CM | POA: Diagnosis not present

## 2020-06-15 MED ORDER — GOSERELIN ACETATE 3.6 MG ~~LOC~~ IMPL
DRUG_IMPLANT | SUBCUTANEOUS | Status: AC
  Filled 2020-06-15: qty 3.6

## 2020-06-15 MED ORDER — GOSERELIN ACETATE 3.6 MG ~~LOC~~ IMPL
3.6000 mg | DRUG_IMPLANT | Freq: Once | SUBCUTANEOUS | Status: AC
  Administered 2020-06-15: 10:00:00 3.6 mg via SUBCUTANEOUS

## 2020-06-15 NOTE — Patient Instructions (Signed)

## 2020-06-20 ENCOUNTER — Other Ambulatory Visit (HOSPITAL_COMMUNITY): Payer: BC Managed Care – PPO

## 2020-06-20 ENCOUNTER — Encounter (HOSPITAL_COMMUNITY): Payer: BC Managed Care – PPO | Admitting: Cardiology

## 2020-06-22 ENCOUNTER — Other Ambulatory Visit: Payer: Self-pay

## 2020-06-22 ENCOUNTER — Inpatient Hospital Stay: Payer: BC Managed Care – PPO

## 2020-06-22 VITALS — BP 116/84 | HR 86 | Temp 98.8°F | Resp 17 | Wt 131.0 lb

## 2020-06-22 DIAGNOSIS — C50912 Malignant neoplasm of unspecified site of left female breast: Secondary | ICD-10-CM

## 2020-06-22 DIAGNOSIS — Z5112 Encounter for antineoplastic immunotherapy: Secondary | ICD-10-CM | POA: Diagnosis not present

## 2020-06-22 MED ORDER — SODIUM CHLORIDE 0.9% FLUSH
10.0000 mL | INTRAVENOUS | Status: DC | PRN
  Administered 2020-06-22: 09:00:00 10 mL
  Filled 2020-06-22: qty 10

## 2020-06-22 MED ORDER — PROCHLORPERAZINE MALEATE 10 MG PO TABS
ORAL_TABLET | ORAL | Status: AC
  Filled 2020-06-22: qty 1

## 2020-06-22 MED ORDER — SODIUM CHLORIDE 0.9 % IV SOLN
Freq: Once | INTRAVENOUS | Status: AC
  Filled 2020-06-22: qty 250

## 2020-06-22 MED ORDER — DIPHENHYDRAMINE HCL 25 MG PO CAPS
ORAL_CAPSULE | ORAL | Status: AC
  Filled 2020-06-22: qty 1

## 2020-06-22 MED ORDER — TRASTUZUMAB-DKST CHEMO 150 MG IV SOLR
6.0000 mg/kg | Freq: Once | INTRAVENOUS | Status: AC
  Administered 2020-06-22: 09:00:00 357 mg via INTRAVENOUS
  Filled 2020-06-22: qty 17

## 2020-06-22 MED ORDER — HEPARIN SOD (PORK) LOCK FLUSH 100 UNIT/ML IV SOLN
500.0000 [IU] | Freq: Once | INTRAVENOUS | Status: AC | PRN
  Administered 2020-06-22: 09:00:00 500 [IU]
  Filled 2020-06-22: qty 5

## 2020-06-22 MED ORDER — ACETAMINOPHEN 325 MG PO TABS
ORAL_TABLET | ORAL | Status: AC
  Filled 2020-06-22: qty 2

## 2020-06-22 MED ORDER — PROCHLORPERAZINE MALEATE 10 MG PO TABS
10.0000 mg | ORAL_TABLET | Freq: Once | ORAL | Status: AC
  Administered 2020-06-22: 08:00:00 10 mg via ORAL

## 2020-06-22 MED ORDER — ACETAMINOPHEN 325 MG PO TABS
650.0000 mg | ORAL_TABLET | Freq: Once | ORAL | Status: AC
  Administered 2020-06-22: 650 mg via ORAL

## 2020-06-22 MED ORDER — DIPHENHYDRAMINE HCL 25 MG PO CAPS
25.0000 mg | ORAL_CAPSULE | Freq: Once | ORAL | Status: AC
  Administered 2020-06-22: 08:00:00 25 mg via ORAL

## 2020-06-22 NOTE — Patient Instructions (Signed)
[facility] Discharge Instructions for Patients Receiving Chemotherapy  Today you received the following immunotherapy treatment: Trastuzumab Carren Rang)  To help prevent nausea and vomiting after your treatment, we encourage you to take your nausea medication as directed by your MD.   If you develop nausea and vomiting that is not controlled by your nausea medication, call the clinic.   BELOW ARE SYMPTOMS THAT SHOULD BE REPORTED IMMEDIATELY:  *FEVER GREATER THAN 100.5 F  *CHILLS WITH OR WITHOUT FEVER  NAUSEA AND VOMITING THAT IS NOT CONTROLLED WITH YOUR NAUSEA MEDICATION  *UNUSUAL SHORTNESS OF BREATH  *UNUSUAL BRUISING OR BLEEDING  TENDERNESS IN MOUTH AND THROAT WITH OR WITHOUT PRESENCE OF ULCERS  *URINARY PROBLEMS  *BOWEL PROBLEMS  UNUSUAL RASH Items with * indicate a potential emergency and should be followed up as soon as possible.  Feel free to call the clinic should you have any questions or concerns. The clinic phone number is [phone].  Please show the CHEMO ALERT CARD at check-in to the Emergency Department and triage nurse.

## 2020-06-26 ENCOUNTER — Telehealth: Payer: Self-pay

## 2020-06-26 NOTE — Telephone Encounter (Signed)
[name] called stating she has lost her appetite while on tamoxifen and she has stopped taking it.  She also states that she continues to have her menses with increased flow.  Reviewed with Dr Mosetta Putt. OK to stop tamoxifen.  I relayed this to Ms Care and she verbalized understanding.

## 2020-07-12 NOTE — Progress Notes (Signed)
Victoria Barker   [phone] [phone]   Clinic Follow up Note   Patient Care Team: [name], MD as PCP - General (Obstetrics and Gynecology) Mauro Kaufmann, RN as Oncology Nurse Navigator Rockwell Germany, RN as Oncology Nurse Navigator Donnie Mesa, MD as Consulting Physician (General Surgery) Truitt Merle, MD as Consulting Physician (Hematology) Eppie Gibson, MD as Attending Physician (Radiation Oncology)  Date of Service:  07/13/2020  CHIEF COMPLAINT: F/u of left breast cancer  SUMMARY OF ONCOLOGIC HISTORY: Oncology History Overview Note  Cancer Staging Malignant neoplasm of upper-outer quadrant of left breast in female, estrogen receptor positive ([place]) Staging form: Breast, AJCC 8th Edition - Clinical stage from 04/28/2019: Stage IIA (cT2, cN0, cM0, G2, ER+, PR-, HER2: Equivocal) - Signed by Truitt Merle, MD on 05/04/2019 - Pathologic stage from 07/13/2019: Stage IIA (pT2, pN0, cM0, G3, ER+, PR-, HER2+, Oncotype DX score: 56) - Signed by Truitt Merle, MD on 08/20/2019    Malignant neoplasm of upper-outer quadrant of left breast in female, estrogen receptor positive ([place])  04/21/2019 Mammogram   Diagnostic mammogram 04/21/19  IMPRESSION 1. findings highly suspicious for left breast cancer. There is a palpable irregular mass 4cm from nipple measuring 2.4x2.4x1.8cm in the 1:00 position axis of the left breast and pleomorphic  calcifications in the upper-outer quadrant as described above.  2. single left 0.9x0.5cm axillary lymph node with a mild/borderline thickened cortex.    04/28/2019 Cancer Staging   Staging form: Breast, AJCC 8th Edition - Clinical stage from 04/28/2019: Stage IIA (cT2, cN0, cM0, G2, ER+, PR-, HER2: Equivocal) - Signed by Truitt Merle, MD on 05/04/2019   04/28/2019 Initial Biopsy   Diagnosis 04/28/19 1. Breast, left, needle core biopsy, 1 o'clock position - INVASIVE DUCTAL CARCINOMA. - DUCTAL CARCINOMA IN SITU. - SEE  COMMENT. 2. Lymph node, needle/core biopsy, left axilla - THERE IS NO EVIDENCE OF CARCINOMA IN 1 OF 1 LYMPH NODE (0/1). 3. Breast, left, needle core biopsy, upper, outer quadrant - DUCTAL CARCINOMA IN SITU WITH CALCIFICATIONS. - SEE COMMENT.   04/28/2019 Receptors her2   1. PROGNOSTIC INDICATORS Results: HER2 negative  Estrogen Receptor: 80%, POSITIVE, STRONG STAINING INTENSITY Progesterone Receptor: 0%, NEGATIVE Proliferation Marker Ki67: 35%    04/30/2019 Initial Diagnosis   Malignant neoplasm of upper-outer quadrant of left breast in female, estrogen receptor positive ([name])    Genetic Testing   No pathogenic variants identified. 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 The final report date is 05/28/2019.  The STAT Breast cancer panel offered by [facility] includes sequencing and rearrangement analysis for the following 9 genes:  ATM, BRCA1, BRCA2, CDH1, CHEK2, PALB2, PTEN, STK11 and TP53.    The Common Hereditary Cancers Panel offered by [facility] includes sequencing and/or deletion duplication testing of the following 48 genes: APC, ATM, AXIN2, BARD1, BMPR1A, BRCA1, BRCA2, BRIP1, CDH1, CDKN2A (p14ARF), CDKN2A (p16INK4a), CKD4, CHEK2, CTNNA1, DICER1, EPCAM (Deletion/duplication testing only), GREM1 (promoter region deletion/duplication testing only), KIT, MEN1, MLH1, MSH2, MSH3, MSH6, MUTYH, NBN, NF1, NHTL1, PALB2, PDGFRA, PMS2, POLD1, POLE, PTEN, RAD50, RAD51C, RAD51D, RNF43, SDHB, SDHC, SDHD, SMAD4, SMARCA4. STK11, TP53, TSC1, TSC2, and VHL.  The following genes were evaluated for sequence changes only: SDHA and HOXB13 c.251G>A variant only.   05/11/2019 Breast MRI   IMPRESSION: 1. Enhancing mass and architectural distortion spanning approximately 6 x 3 x 5.3 cm in the upper outer left breast consistent with the patient's biopsy-proven site of invasive  cancer. 2. Biopsy changes in the anterior left breast at the  site of the patient's biopsy-proven ductal carcinoma in situ. 3. Suspicious changes within the upper-outer quadrant of the left breast spanning a total of 8 cm in the AP dimension. This includes the site of biopsy-proven DCIS anteriorly and suspicious enhancement extending to the level of the pectoralis muscle posteriorly. 4. Suspicious 7 mm enhancing mass in the far posterior slightly lateral left breast (image 138/212). 5. No MRI evidence of malignancy on the right. 6. No suspicious lymphadenopathy.     05/26/2019 -  Anti-estrogen oral therapy   Tamoxifen 81m daily starting 05/26/19. Stopped before surgery and chemo on 07/13/19. Restart in 12/2019.  -----Due to insomnia and nausea, reduced dose to 10 in 05/2020.  -----Started her on Ovarian suppression Zoladex on 06/15/20.    07/13/2019 Surgery   LEFT NIPPLE SPARING MASTECTOMY WITH SENTINEL LYMPH NODE BIOPSY and LEFT BREAST RECONSTRUCTION WITH PLACEMENT OF TISSUE EXPANDER AND ALLODERM by D.r Tsuie and Dr TIran Planas    07/13/2019 Pathology Results   SURGICAL PATHOLOGY   FINAL MICROSCOPIC DIAGNOSIS:   A. LYMPH NODE, LEFT AXILLARY #1, SENTINEL, BIOPSY:  - One of one lymph nodes negative for carcinoma (0/1).   B. BREAST, LEFT, MASTECTOMY:  - Invasive ductal carcinoma, grade 3, spanning 3.4 cm.  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  - Invasive carcinoma is 0.2 cm from the posterior margin focally (not on  ink) and 0.4 cm from the  anterior margin focally (not on ink).  - In situ carcinoma is 0.4 cm from the posterior margin focally (not on  ink).  - Biopsy site x2.  - See oncology table.   C. NIPPLE, LEFT, BIOPSY:  - Benign nipple tissue.   D. LYMPH NODE, LEFT AXILLARY #2, SENTINEL, BIOPSY:  - One of one lymph nodes negative for carcinoma (0/1).     GROUP 1:  HER2 **POSITIVE**    07/13/2019 Oncotype testing   Ocotype score 56 with 39% risk of recurrence with Tmaoxifen or AI alone. There is a 15% benefit from  adjuvant chemo.     07/13/2019 Cancer Staging   Staging form: Breast, AJCC 8th Edition - Pathologic stage from 07/13/2019: Stage IIA (pT2, pN0, cM0, G3, ER+, PR-, HER2+, Oncotype DX score: 56) - Signed by FTruitt Merle MD on 08/20/2019   08/25/2019 Echocardiogram   Baseline Echo  IMPRESSIONS     1. Left ventricular ejection fraction, by estimation, is 65 to 70%. The  left ventricle has normal function. The left ventricle has no regional  wall motion abnormalities. Left ventricular diastolic parameters were  normal.   2. Right ventricular systolic function is normal. The right ventricular  size is normal. There is normal pulmonary artery systolic pressure.   3. Left atrial size was mildly dilated.   4. The mitral valve is normal in structure and function. Trivial mitral  valve regurgitation.   5. The aortic valve is tricuspid. Aortic valve regurgitation is not  visualized.   6. The inferior vena cava is normal in size with greater than 50%  respiratory variability, suggesting right atrial pressure of 3 mmHg.   7. The aortic valve is not well seen. It is trileaflet. There appears to  be some calcification associated with the non-coronary cusp but this is  not well seen.    08/25/2019 Imaging   Whole body Bone Scan  IMPRESSION: No significant abnormality identified.   08/25/2019 Imaging   CT CAP w Contrast  IMPRESSION: 1.  No findings of metastatic disease to the chest, abdomen, or pelvis. 2. 0.7 by 0.3 cm sclerotic lesion in the right seventh rib laterally is likely benign but merits surveillance. 3. Mild sclerosis along the sacroiliac joints bilaterally, query mild chronic sacroiliitis.     09/01/2019 Procedure   INSERTION PORT-A-CATH WITH ULTRASOUND GUIDANCE by Dr. Georgette Dover   09/02/2019 -  Chemotherapy   TCH (Taxol, Carboplatin, Herceptin) q3weeks for 6 cycles starting 09/02/19-12/16/19, followed by anti-HER2 maintenance therapy with Herceptin q3weeks starting 01/06/20 to complete 1 year  of treatment (from 08/2019)     05/05/2020 Surgery   REMOVAL OF LEFT CHEST TISSUE EXPANDER AND PLACEMENT OF IMPLANT and RIGHT BREAST MASTOPEXY and LIPOFILLING FROM ABDOMEN TO RIGHT CHEST by Dr Iran Planas   Invasive ductal carcinoma of breast, female, left ([place])  07/13/2019 Initial Diagnosis   Invasive ductal carcinoma of breast, female, left ([place])      CURRENT THERAPY:  -Tamoxifen 106m daily starting 05/26/19. Stopped before surgery and chemo on 07/13/19. Restart in 12/2019.Due to insomnia and nausea, reduced dose to 10 in 05/2020. Started her on Ovarian suppression Zoladex on 06/15/20.  -[place] for 6 cycles starting 09/02/19-12/16/19, followed by maintenance Herceptinstarting 7/15/21to complete one year therapy(from 08/2019).  INTERVAL HISTORY:  Victoria Barker here for a follow up. She presents to the clinic alone. She did not tolerated Tamoxifen, with nausea, low appetite and fatigue, even on half dose, so she stopped 2 weeks ago, she is back to normal now  She has noticed more hot flush since her zoladex injection, and she did have a menstrual period right after the injection   All other systems were reviewed with the patient and are negative.  MEDICAL HISTORY:  Past Medical History:  Diagnosis Date  . Breast cancer ([facility] 2020   Left Mastectomy  . Cancer ([facility]    Left breast Ca  . GERD (gastroesophageal reflux disease)   . Personal history of chemotherapy 08/2019-11/2019  . Scoliosis   . Sickle cell trait ([place]     SURGICAL HISTORY: Past Surgical History:  Procedure Laterality Date  . BREAST RECONSTRUCTION WITH PLACEMENT OF TISSUE EXPANDER AND ALLODERM Left 07/13/2019   Procedure: LEFT BREAST RECONSTRUCTION WITH PLACEMENT OF TISSUE EXPANDER AND ALLODERM;  Surgeon: TIrene Limbo MD;  [facility]  Service: Plastics;  Laterality: Left;  . DEBRIDEMENT AND CLOSURE WOUND Left 07/30/2019   Procedure: DEBRIDEMENT LEFT MASTECTOMY FLAP;  Surgeon: TIrene Limbo  MD;  [facility]  Service: Plastics;  Laterality: Left;  . INDUCED ABORTION  2017  . LIPOSUCTION WITH LIPOFILLING Right 05/05/2020   Procedure: LIPOFILLING FROM ABDOMEN TO RIGHT CHEST;  Surgeon: TIrene Limbo MD;  [facility]  Service: Plastics;  Laterality: Right;  . MASTECTOMY Left 07/13/2019  . MASTOPEXY Right 05/05/2020   Procedure: RIGHT BREAST MASTOPEXY;  Surgeon: TIrene Limbo MD;  [facility]  Service: Plastics;  Laterality: Right;  . NIPPLE SPARING MASTECTOMY WITH SENTINEL LYMPH NODE BIOPSY Left 07/13/2019   Procedure: LEFT NIPPLE SPARING MASTECTOMY WITH SENTINEL LYMPH NODE BIOPSY;  Surgeon: TDonnie Mesa MD;  [facility]  Service: General;  Laterality: Left;  . PORTACATH PLACEMENT Right 09/01/2019   Procedure: INSERTION PORT-A-CATH WITH ULTRASOUND GUIDANCE;  Surgeon: TDonnie Mesa MD;  Location: [facility]  Service: General;  Laterality: Right;  . REMOVAL OF TISSUE EXPANDER AND PLACEMENT OF IMPLANT Left 05/05/2020   Procedure: REMOVAL OF LEFT CHEST TISSUE EXPANDER AND PLACEMENT OF IMPLANT;  Surgeon:  Irene Limbo, MD;  Location: [facility];  Service: Plastics;  Laterality: Left;  . TISSUE EXPANDER PLACEMENT Left 07/30/2019   Procedure: TISSUE EXPANSION LEFT CHEST;  Surgeon: Irene Limbo, MD;  Location: [facility];  Service: Plastics;  Laterality: Left;    I have reviewed the social history and family history with the patient and they are unchanged from previous note.  ALLERGIES:  is allergic to bactrim [sulfamethoxazole-trimethoprim].  MEDICATIONS:  Current Outpatient Medications  Medication Sig Dispense Refill  . dexlansoprazole (DEXILANT) 60 MG capsule Take 60 mg by mouth daily.    . Lactobacillus-Inulin ([place] PO) Take 1 capsule by mouth daily as needed (digestive health.).     Marland Kitchen lidocaine-prilocaine (EMLA) cream  Apply 1 application topically as needed. 30 g 0  . Multiple Vitamin (MULTIVITAMIN WITH MINERALS) TABS tablet Take 1 tablet by mouth 3 (three) times a week. WOMEN'S ONE-A-DAY    . ondansetron (ZOFRAN) 8 MG tablet Take 1 tablet (8 mg total) by mouth 2 (two) times daily as needed for refractory nausea / vomiting. Start on day 3 after chemo. 30 tablet 1  . prochlorperazine (COMPAZINE) 10 MG tablet Take 1 tablet (10 mg total) by mouth every 6 (six) hours as needed (Nausea or vomiting). 30 tablet 1  . tamoxifen (NOLVADEX) 20 MG tablet TAKE 1 TABLET BY MOUTH EVERY DAY 90 tablet 1  . venlafaxine XR (EFFEXOR-XR) 37.5 MG 24 hr capsule Take 1 capsule (37.5 mg total) by mouth daily with breakfast. 30 capsule 3   No current facility-administered medications for this visit.    PHYSICAL EXAMINATION: ECOG PERFORMANCE STATUS: 1 - Symptomatic but completely ambulatory  Vitals:   07/13/20 0857  BP: (!) 121/93  Pulse: 100  Resp: 14  Temp: 97.6 F (36.4 C)  SpO2: 100%   Filed Weights   07/13/20 0857  Weight: 135 lb 6.4 oz (61.4 kg)    GENERAL:alert, no distress and comfortable SKIN: skin color, texture, turgor are normal, no rashes or significant lesions EYES: normal, Conjunctiva are pink and non-injected, sclera clear Musculoskeletal:no cyanosis of digits and no clubbing  NEURO: alert & oriented x 3 with fluent speech, no focal motor/sensory deficits Breast exam deferred today   LABORATORY DATA:  I have reviewed the data as listed CBC Latest Ref Rng & Units 05/11/2020 03/09/2020 01/06/2020  WBC 4.0 - 10.5 K/uL 5.7 5.1 5.0  Hemoglobin 12.0 - 15.0 g/dL 10.8(L) 12.6 12.1  Hematocrit 36.0 - 46.0 % 30.9(L) 37.1 36.5  Platelets 150 - 400 K/uL 197 191 185     CMP Latest Ref Rng & Units 05/11/2020 03/09/2020 01/06/2020  Glucose 70 - 99 mg/dL 125(H) 86 91  BUN 6 - 20 mg/dL 13 12 10   Creatinine 0.44 - 1.00 mg/dL 0.77 0.79 0.79  Sodium 135 - 145 mmol/L 138 140 140  Potassium 3.5 - 5.1 mmol/L 3.9 4.0  3.8  Chloride 98 - 111 mmol/L 105 107 104  CO2 22 - 32 mmol/L 24 28 26   Calcium 8.9 - 10.3 mg/dL 8.8(L) 8.8(L) 8.9  Total Protein 6.5 - 8.1 g/dL 6.5 6.8 6.5  Total Bilirubin 0.3 - 1.2 mg/dL 0.5 0.4 <0.2(L)  Alkaline Phos 38 - 126 U/L 54 69 58  AST 15 - 41 U/L 15 16 23   ALT 0 - 44 U/L 8 10 17       RADIOGRAPHIC STUDIES: I have personally reviewed the radiological images as listed and agreed with the findings in the report. No results found.  ASSESSMENT & PLAN:  Victoria Barker is a 40 y.o. female with   1.Malignant neoplasm of upper-outer quadrant of left breast,invasive ductal carcinoma and DCIS, stageIIA,pT2N0M0, ER+/PR-/HER2+, GradeIII -She was diagnosed in 04/2019. She was found to have2 left breast masses with DCIS and 1 mass with Invasive ductal carcinoma, her abnormal lymph node was biopsied and found to be negative. -On 07/13/19 she underwent left breast mastectomy with Dr. Prince Solian and reconstruction with Dr. Iran Planas.Tumorwas found to be HER2 positiveonresected sample and Oncotypetesting(ordered before HER2 came back positive)withrecurrence score of 56. -To reduce her high risk of recurrenceshe completed 6 cycles ofadjuvantchemotherapy with TCH (09/02/19-12/16/19). She will continue withmaintenance Herceptinq3weeks starting 7/15/21to complete one year therapyuntil 08/2020 -Given node negativedisease andmastectomy, Dr Isidore Moos did not recommend adjuvant radiation. -She restarted Tamoxifen in 12/2019.Due to insomnia and intermittent nausea she can reduce to 90m but could not tolerate and stopped in early Jan 2022.  -She proceeded with ovarian suppression Zoladex injections on 06/15/20, will continue every 4 weeks until her BSO   -I discussed the option of aromatase inhibitor, benefit and side effects, especially arthralgia, osteopenia and osteoporosis, it etc., she agrees to proceed.  I called in letrozole to her pharmacy today.  She will start in 2 weeks -She  stopped Effexor about 2 weeks ago, does have hot flashes, I encouraged her to restart Effexor, she agrees -We will continue trastuzumab infusion, she has 2 more treatments left.  Okay to remove her port after she completes -Follow-up in 6 weeks before her last infusion   2. Hot Flashes -She has had worsened hot flashes day and night which are effecting her sleep. -I started her on Effexor in 12/2019.Her hot flushes has much improved.  -she will restart Effexor   3.  Intermittent nausea, abdominal cramps and vaginal spotting -We will reduce tamoxifen to 183m I started her on ovarian suppression Zoladex on 06/15/20. May switch to Anastrozole soon.   4. Genetictesting negative for pathogenetic mutations.   PLAN: -she has stopped tamoxifen due to poor tolerance -I called in letrozole to her pharmacy today, she will start in a few weeks -She will restart Effexor -We will proceed trastuzumab infusion and continue every 3 weeks for 2 more treatment  -second Zoladex injection today and continue every 4 weeks  -Follow-up in 6 weeks before her last infusion    No problem-specific Assessment & Plan notes found for this encounter.   No orders of the defined types were placed in this encounter.  All questions were answered. The patient knows to call the clinic with any problems, questions or concerns. No barriers to learning was detected. The total time spent in the appointment was 30 minutes.     YaTruitt MerleMD 07/13/2020   I, AmJoslyn Devonam acting as scribe for YaTruitt MerleMD.   I have reviewed the above documentation for accuracy and completeness, and I agree with the above.

## 2020-07-13 ENCOUNTER — Encounter: Payer: Self-pay | Admitting: Hematology

## 2020-07-13 ENCOUNTER — Other Ambulatory Visit: Payer: Self-pay

## 2020-07-13 ENCOUNTER — Inpatient Hospital Stay (HOSPITAL_BASED_OUTPATIENT_CLINIC_OR_DEPARTMENT_OTHER): Payer: BC Managed Care – PPO | Admitting: Hematology

## 2020-07-13 ENCOUNTER — Encounter: Payer: Self-pay | Admitting: *Deleted

## 2020-07-13 ENCOUNTER — Inpatient Hospital Stay: Payer: BC Managed Care – PPO | Attending: Hematology

## 2020-07-13 VITALS — BP 121/93 | HR 100 | Temp 97.6°F | Resp 14 | Ht 62.0 in | Wt 135.4 lb

## 2020-07-13 DIAGNOSIS — C50412 Malignant neoplasm of upper-outer quadrant of left female breast: Secondary | ICD-10-CM | POA: Diagnosis present

## 2020-07-13 DIAGNOSIS — Z79899 Other long term (current) drug therapy: Secondary | ICD-10-CM | POA: Insufficient documentation

## 2020-07-13 DIAGNOSIS — C50912 Malignant neoplasm of unspecified site of left female breast: Secondary | ICD-10-CM

## 2020-07-13 DIAGNOSIS — Z5112 Encounter for antineoplastic immunotherapy: Secondary | ICD-10-CM | POA: Insufficient documentation

## 2020-07-13 DIAGNOSIS — Z5111 Encounter for antineoplastic chemotherapy: Secondary | ICD-10-CM | POA: Diagnosis present

## 2020-07-13 DIAGNOSIS — Z170 Estrogen receptor positive status [ER+]: Secondary | ICD-10-CM | POA: Diagnosis not present

## 2020-07-13 DIAGNOSIS — Z95828 Presence of other vascular implants and grafts: Secondary | ICD-10-CM

## 2020-07-13 MED ORDER — ACETAMINOPHEN 325 MG PO TABS
ORAL_TABLET | ORAL | Status: AC
  Filled 2020-07-13: qty 2

## 2020-07-13 MED ORDER — SODIUM CHLORIDE 0.9% FLUSH
10.0000 mL | INTRAVENOUS | Status: DC | PRN
  Administered 2020-07-13: 10 mL
  Filled 2020-07-13: qty 10

## 2020-07-13 MED ORDER — PROCHLORPERAZINE MALEATE 10 MG PO TABS
ORAL_TABLET | ORAL | Status: AC
  Filled 2020-07-13: qty 1

## 2020-07-13 MED ORDER — TRASTUZUMAB-DKST CHEMO 150 MG IV SOLR
6.0000 mg/kg | Freq: Once | INTRAVENOUS | Status: AC
  Administered 2020-07-13: 357 mg via INTRAVENOUS
  Filled 2020-07-13: qty 17

## 2020-07-13 MED ORDER — DIPHENHYDRAMINE HCL 25 MG PO CAPS
ORAL_CAPSULE | ORAL | Status: AC
  Filled 2020-07-13: qty 1

## 2020-07-13 MED ORDER — HEPARIN SOD (PORK) LOCK FLUSH 100 UNIT/ML IV SOLN
500.0000 [IU] | Freq: Once | INTRAVENOUS | Status: AC | PRN
  Administered 2020-07-13: 500 [IU]
  Filled 2020-07-13: qty 5

## 2020-07-13 MED ORDER — SODIUM CHLORIDE 0.9 % IV SOLN
Freq: Once | INTRAVENOUS | Status: AC
  Filled 2020-07-13: qty 250

## 2020-07-13 MED ORDER — LETROZOLE 2.5 MG PO TABS: 2.5000 mg | ORAL_TABLET | Freq: Every day | ORAL | 3 refills | Status: DC

## 2020-07-13 MED ORDER — PROCHLORPERAZINE MALEATE 10 MG PO TABS
10.0000 mg | ORAL_TABLET | Freq: Once | ORAL | Status: AC
  Administered 2020-07-13: 10 mg via ORAL

## 2020-07-13 MED ORDER — ACETAMINOPHEN 325 MG PO TABS
650.0000 mg | ORAL_TABLET | Freq: Once | ORAL | Status: AC
  Administered 2020-07-13: 650 mg via ORAL

## 2020-07-13 MED ORDER — GOSERELIN ACETATE 3.6 MG ~~LOC~~ IMPL
DRUG_IMPLANT | SUBCUTANEOUS | Status: AC
  Filled 2020-07-13: qty 3.6

## 2020-07-13 MED ORDER — GOSERELIN ACETATE 3.6 MG ~~LOC~~ IMPL
3.6000 mg | DRUG_IMPLANT | Freq: Once | SUBCUTANEOUS | Status: AC
  Administered 2020-07-13: 3.6 mg via SUBCUTANEOUS

## 2020-07-13 MED ORDER — DIPHENHYDRAMINE HCL 25 MG PO CAPS
25.0000 mg | ORAL_CAPSULE | Freq: Once | ORAL | Status: AC
  Administered 2020-07-13: 25 mg via ORAL

## 2020-07-13 NOTE — Patient Instructions (Signed)
[facility] Discharge Instructions for Patients Receiving Chemotherapy  Today you received the following immunotherapy treatment: Trastuzumab (Ogivri)  To help prevent nausea and vomiting after your treatment, we encourage you to take your nausea medication as directed by your MD.   If you develop nausea and vomiting that is not controlled by your nausea medication, call the clinic.   BELOW ARE SYMPTOMS THAT SHOULD BE REPORTED IMMEDIATELY:  *FEVER GREATER THAN 100.5 F  *CHILLS WITH OR WITHOUT FEVER  NAUSEA AND VOMITING THAT IS NOT CONTROLLED WITH YOUR NAUSEA MEDICATION  *UNUSUAL SHORTNESS OF BREATH  *UNUSUAL BRUISING OR BLEEDING  TENDERNESS IN MOUTH AND THROAT WITH OR WITHOUT PRESENCE OF ULCERS  *URINARY PROBLEMS  *BOWEL PROBLEMS  UNUSUAL RASH Items with * indicate a potential emergency and should be followed up as soon as possible.  Feel free to call the clinic should you have any questions or concerns. The clinic phone number is [phone].  Please show the CHEMO ALERT CARD at check-in to the Emergency Department and triage nurse.   

## 2020-07-17 ENCOUNTER — Telehealth: Payer: Self-pay | Admitting: Hematology

## 2020-07-17 NOTE — Telephone Encounter (Signed)
Scheduled appts per 1/20 los. Pt confirmed appt dates/times.

## 2020-07-20 ENCOUNTER — Encounter: Payer: Self-pay | Admitting: *Deleted

## 2020-07-24 ENCOUNTER — Other Ambulatory Visit: Payer: Self-pay | Admitting: *Deleted

## 2020-08-03 ENCOUNTER — Other Ambulatory Visit: Payer: Self-pay

## 2020-08-03 ENCOUNTER — Inpatient Hospital Stay: Payer: BC Managed Care – PPO | Attending: Hematology

## 2020-08-03 ENCOUNTER — Ambulatory Visit: Payer: BC Managed Care – PPO

## 2020-08-03 VITALS — BP 124/90 | HR 100 | Temp 98.8°F | Resp 18 | Wt 135.8 lb

## 2020-08-03 DIAGNOSIS — C50412 Malignant neoplasm of upper-outer quadrant of left female breast: Secondary | ICD-10-CM

## 2020-08-03 DIAGNOSIS — Z79899 Other long term (current) drug therapy: Secondary | ICD-10-CM | POA: Diagnosis not present

## 2020-08-03 DIAGNOSIS — Z5112 Encounter for antineoplastic immunotherapy: Secondary | ICD-10-CM | POA: Insufficient documentation

## 2020-08-03 DIAGNOSIS — C50912 Malignant neoplasm of unspecified site of left female breast: Secondary | ICD-10-CM

## 2020-08-03 DIAGNOSIS — Z5111 Encounter for antineoplastic chemotherapy: Secondary | ICD-10-CM | POA: Diagnosis present

## 2020-08-03 DIAGNOSIS — Z170 Estrogen receptor positive status [ER+]: Secondary | ICD-10-CM

## 2020-08-03 MED ORDER — PROCHLORPERAZINE MALEATE 10 MG PO TABS
10.0000 mg | ORAL_TABLET | Freq: Once | ORAL | Status: AC
  Administered 2020-08-03: 10 mg via ORAL

## 2020-08-03 MED ORDER — TRASTUZUMAB-DKST CHEMO 150 MG IV SOLR
6.0000 mg/kg | Freq: Once | INTRAVENOUS | Status: AC
  Administered 2020-08-03: 357 mg via INTRAVENOUS
  Filled 2020-08-03: qty 17

## 2020-08-03 MED ORDER — ACETAMINOPHEN 325 MG PO TABS
ORAL_TABLET | ORAL | Status: AC
  Filled 2020-08-03: qty 2

## 2020-08-03 MED ORDER — PROCHLORPERAZINE MALEATE 10 MG PO TABS
ORAL_TABLET | ORAL | Status: AC
  Filled 2020-08-03: qty 1

## 2020-08-03 MED ORDER — ACETAMINOPHEN 325 MG PO TABS
650.0000 mg | ORAL_TABLET | Freq: Once | ORAL | Status: AC
  Administered 2020-08-03: 650 mg via ORAL

## 2020-08-03 MED ORDER — DIPHENHYDRAMINE HCL 25 MG PO CAPS
ORAL_CAPSULE | ORAL | Status: AC
  Filled 2020-08-03: qty 1

## 2020-08-03 MED ORDER — SODIUM CHLORIDE 0.9 % IV SOLN
Freq: Once | INTRAVENOUS | Status: AC
  Filled 2020-08-03: qty 250

## 2020-08-03 MED ORDER — DIPHENHYDRAMINE HCL 25 MG PO CAPS
25.0000 mg | ORAL_CAPSULE | Freq: Once | ORAL | Status: AC
  Administered 2020-08-03: 25 mg via ORAL

## 2020-08-03 MED ORDER — SODIUM CHLORIDE 0.9% FLUSH
10.0000 mL | INTRAVENOUS | Status: DC | PRN
  Administered 2020-08-03: 10 mL
  Filled 2020-08-03: qty 10

## 2020-08-03 MED ORDER — HEPARIN SOD (PORK) LOCK FLUSH 100 UNIT/ML IV SOLN
500.0000 [IU] | Freq: Once | INTRAVENOUS | Status: AC | PRN
  Administered 2020-08-03: 500 [IU]
  Filled 2020-08-03: qty 5

## 2020-08-03 NOTE — Progress Notes (Signed)
Rn contacted MD and RN, States pt does not need pregnancy test if taking Zoledex(which she is taking)

## 2020-08-03 NOTE — Patient Instructions (Signed)
[facility] Discharge Instructions for Patients Receiving Chemotherapy  Today you received the following chemotherapy agents Traztuzumab  To help prevent nausea and vomiting after your treatment, we encourage you to take your nausea medication as directed.    If you develop nausea and vomiting that is not controlled by your nausea medication, call the clinic.   BELOW ARE SYMPTOMS THAT SHOULD BE REPORTED IMMEDIATELY:  *FEVER GREATER THAN 100.5 F  *CHILLS WITH OR WITHOUT FEVER  NAUSEA AND VOMITING THAT IS NOT CONTROLLED WITH YOUR NAUSEA MEDICATION  *UNUSUAL SHORTNESS OF BREATH  *UNUSUAL BRUISING OR BLEEDING  TENDERNESS IN MOUTH AND THROAT WITH OR WITHOUT PRESENCE OF ULCERS  *URINARY PROBLEMS  *BOWEL PROBLEMS  UNUSUAL RASH Items with * indicate a potential emergency and should be followed up as soon as possible.  Feel free to call the clinic should you have any questions or concerns. The clinic phone number is [phone].  Please show the CHEMO ALERT CARD at check-in to the Emergency Department and triage nurse.   

## 2020-08-04 ENCOUNTER — Other Ambulatory Visit: Payer: Self-pay

## 2020-08-04 NOTE — Progress Notes (Signed)
Error

## 2020-08-10 ENCOUNTER — Inpatient Hospital Stay: Payer: BC Managed Care – PPO

## 2020-08-10 ENCOUNTER — Other Ambulatory Visit: Payer: Self-pay

## 2020-08-10 VITALS — BP 116/86 | HR 103 | Temp 98.9°F | Resp 16

## 2020-08-10 DIAGNOSIS — Z95828 Presence of other vascular implants and grafts: Secondary | ICD-10-CM

## 2020-08-10 DIAGNOSIS — C50412 Malignant neoplasm of upper-outer quadrant of left female breast: Secondary | ICD-10-CM | POA: Diagnosis not present

## 2020-08-10 MED ORDER — GOSERELIN ACETATE 3.6 MG ~~LOC~~ IMPL
3.6000 mg | DRUG_IMPLANT | Freq: Once | SUBCUTANEOUS | Status: AC
Start: 2020-08-10 — End: 2020-08-10
  Administered 2020-08-10: 3.6 mg via SUBCUTANEOUS

## 2020-08-10 MED ORDER — GOSERELIN ACETATE 3.6 MG ~~LOC~~ IMPL
DRUG_IMPLANT | SUBCUTANEOUS | Status: AC
  Filled 2020-08-10: qty 3.6

## 2020-08-16 ENCOUNTER — Other Ambulatory Visit: Payer: Self-pay

## 2020-08-16 ENCOUNTER — Ambulatory Visit (HOSPITAL_COMMUNITY)
Admission: RE | Admit: 2020-08-16 | Discharge: 2020-08-16 | Disposition: A | Discharge status: Home / Self Care | Payer: BC Managed Care – PPO | Source: Ambulatory Visit | Attending: Cardiology | Admitting: Cardiology

## 2020-08-16 ENCOUNTER — Ambulatory Visit (HOSPITAL_BASED_OUTPATIENT_CLINIC_OR_DEPARTMENT_OTHER)
Admission: RE | Admit: 2020-08-16 | Discharge: 2020-08-16 | Disposition: A | Discharge status: Home / Self Care | Payer: BC Managed Care – PPO | Source: Ambulatory Visit | Attending: Cardiology | Admitting: Cardiology

## 2020-08-16 VITALS — BP 112/79 | HR 87 | Wt 136.0 lb

## 2020-08-16 DIAGNOSIS — Z170 Estrogen receptor positive status [ER+]: Secondary | ICD-10-CM | POA: Diagnosis not present

## 2020-08-16 DIAGNOSIS — Z01818 Encounter for other preprocedural examination: Secondary | ICD-10-CM | POA: Insufficient documentation

## 2020-08-16 DIAGNOSIS — K219 Gastro-esophageal reflux disease without esophagitis: Secondary | ICD-10-CM | POA: Insufficient documentation

## 2020-08-16 DIAGNOSIS — Z87891 Personal history of nicotine dependence: Secondary | ICD-10-CM | POA: Insufficient documentation

## 2020-08-16 DIAGNOSIS — Z8249 Family history of ischemic heart disease and other diseases of the circulatory system: Secondary | ICD-10-CM | POA: Diagnosis not present

## 2020-08-16 DIAGNOSIS — Z9012 Acquired absence of left breast and nipple: Secondary | ICD-10-CM | POA: Diagnosis not present

## 2020-08-16 DIAGNOSIS — Z79899 Other long term (current) drug therapy: Secondary | ICD-10-CM | POA: Insufficient documentation

## 2020-08-16 DIAGNOSIS — C50412 Malignant neoplasm of upper-outer quadrant of left female breast: Secondary | ICD-10-CM | POA: Insufficient documentation

## 2020-08-16 LAB — ECHOCARDIOGRAM COMPLETE
Area-P 1/2: 3.3 cm2
Calc EF: 59.4 %
S' Lateral: 3 cm
Single Plane A2C EF: 62.1 %
Single Plane A4C EF: 58.6 %

## 2020-08-16 NOTE — Patient Instructions (Addendum)
CONGRATULATIONS!!!  You have graduated the [facility], follow up as needed

## 2020-08-16 NOTE — Progress Notes (Signed)
Oncology: Dr. Burr Medico  40 y.o. with history of breast cancer was referred by Dr. Burr Medico for cardio-oncology evaluation. Cancer was diagnosed in 11/20, EF+/PR-/HER2+.  She had left mastectomy in 1/21.  Chemotherapy started in 3/21 with taxol, carboplatin, and Herceptin x 6 cycles then Herceptin alone to complete a year.   Echo was done today and reviewed, EF 67-12%, normal diastolic function, GLS -45.8%.   Patient has no cardiac history.  She is a nonsmoker.  No significant exertional dyspnea or chest pain.  She has only 1 Herceptin infusion left.    ECG (personally reviewed): NSR, normal.   PMH: 1. Breast cancer: Diagnosed in 11/20, EF+/PR-/HER2+.  She had left mastectomy in 1/21.  Chemotherapy started in 3/21 with taxol, carboplatin, and Herceptin x 6 cycles then Herceptin alone to complete a year.  - Echo (9/21): EF 60-65%, GLS -20.6%, normal RV.  - Echo (2/22): EF 09-98%, normal diastolic function, GLS -33.8%, normal RV.   Social History   Socioeconomic History  . Marital status: Single    Spouse name: Not on file  . Number of children: 2  . Years of education: Not on file  . Highest education level: Not on file  Occupational History  . Occupation: [employer]   Tobacco Use  . Smoking status: Former Smoker    Packs/day: 0.25    Years: 5.00    Pack years: 1.25  . Smokeless tobacco: Never Used  Vaping Use  . Vaping Use: Former  Substance and Sexual Activity  . Alcohol use: Not Currently  . Drug use: Not Currently    Types: Marijuana    Comment: last time yesterday (07-29-19)6  . Sexual activity: Not on file  Other Topics Concern  . Not on file  Social History Narrative  . Not on file   Social Determinants of Health   Financial Resource Strain: Not on file  Food Insecurity: Not on file  Transportation Needs: Not on file  Physical Activity: Not on file  Stress: Not on file  Social Connections: Not on file  Intimate Partner Violence: Not on file   Family History  Problem  Relation Age of Onset  . Hypertension Mother   . Hypertension Maternal Grandmother    ROS: All systems reviewed and negative except as per HPI.   Current Outpatient Medications  Medication Sig Dispense Refill  . dexlansoprazole (DEXILANT) 60 MG capsule Take 60 mg by mouth daily.    . Lactobacillus-Inulin ([place] PO) Take 1 capsule by mouth daily as needed (digestive health.).     Marland Kitchen letrozole (FEMARA) 2.5 MG tablet Take 1 tablet (2.5 mg total) by mouth daily. 30 tablet 3  . lidocaine-prilocaine (EMLA) cream Apply 1 application topically as needed. 30 g 0  . venlafaxine XR (EFFEXOR-XR) 37.5 MG 24 hr capsule Take 1 capsule (37.5 mg total) by mouth daily with breakfast. 30 capsule 3   No current facility-administered medications for this encounter.   BP 112/79   Pulse 87   Wt 61.7 kg (136 lb)   SpO2 100%   BMI 24.87 kg/m  General: NAD Neck: No JVD, no thyromegaly or thyroid nodule.  Lungs: Clear to auscultation bilaterally with normal respiratory effort. CV: Nondisplaced PMI.  Heart regular S1/S2, no S3/S4, no murmur.  No peripheral edema.  No carotid bruit.  Normal pedal pulses.  Abdomen: Soft, nontender, no hepatosplenomegaly, no distention.  Skin: Intact without lesions or rashes.  Neurologic: Alert and oriented x 3.  Psych: Normal affect. Extremities: No clubbing  or cyanosis.  HEENT: Normal.   Assessment/Plan: 1. Breast cancer: Patient has only 1 more Herceptin infusion.  Echo was reviewed today, showing normal EF and strain.  She will not need further screening echoes and can followup as needed.   Loralie Champagne 08/16/2020

## 2020-08-16 NOTE — Progress Notes (Signed)
  Echocardiogram 2D Echocardiogram has been performed.  Geoffery Lyons Swaim 08/16/2020, 10:06 AM

## 2020-08-21 NOTE — Progress Notes (Signed)
[name]   Telephone:([phone]) [phone] Fax:([phone]) [phone]   Clinic Follow up Note   Patient Care Team: [name], MD as PCP - General (Obstetrics and Gynecology) Mauro Kaufmann, RN as Oncology Nurse Navigator Rockwell Germany, RN as Oncology Nurse Navigator Donnie Mesa, MD as Consulting Physician (General Surgery) Truitt Merle, MD as Consulting Physician (Hematology) Eppie Gibson, MD as Attending Physician (Radiation Oncology)  Date of Service:  08/24/2020  CHIEF COMPLAINT: F/u of left breast cancer  SUMMARY OF ONCOLOGIC HISTORY: Oncology History Overview Note  Cancer Staging Malignant neoplasm of upper-outer quadrant of left breast in female, estrogen receptor positive ([place]) Staging form: Breast, AJCC 8th Edition - Clinical stage from 04/28/2019: Stage IIA (cT2, cN0, cM0, G2, ER+, PR-, HER2: Equivocal) - Signed by Truitt Merle, MD on 05/04/2019 - Pathologic stage from 07/13/2019: Stage IIA (pT2, pN0, cM0, G3, ER+, PR-, HER2+, Oncotype DX score: 38) - Signed by Truitt Merle, MD on 08/20/2019    Malignant neoplasm of upper-outer quadrant of left breast in female, estrogen receptor positive ([place])  04/21/2019 Mammogram   Diagnostic mammogram 04/21/19  IMPRESSION 1. findings highly suspicious for left breast cancer. There is a palpable irregular mass 4cm from nipple measuring 2.4x2.4x1.8cm in the 1:00 position axis of the left breast and pleomorphic  calcifications in the upper-outer quadrant as described above.  2. single left 0.9x0.5cm axillary lymph node with a mild/borderline thickened cortex.    04/28/2019 Cancer Staging   Staging form: Breast, AJCC 8th Edition - Clinical stage from 04/28/2019: Stage IIA (cT2, cN0, cM0, G2, ER+, PR-, HER2: Equivocal) - Signed by Truitt Merle, MD on 05/04/2019   04/28/2019 Initial Biopsy   Diagnosis 04/28/19 1. Breast, left, needle core biopsy, 1 o'clock position - INVASIVE DUCTAL CARCINOMA. - DUCTAL CARCINOMA IN SITU. - SEE  COMMENT. 2. Lymph node, needle/core biopsy, left axilla - THERE IS NO EVIDENCE OF CARCINOMA IN 1 OF 1 LYMPH NODE (0/1). 3. Breast, left, needle core biopsy, upper, outer quadrant - DUCTAL CARCINOMA IN SITU WITH CALCIFICATIONS. - SEE COMMENT.   04/28/2019 Receptors her2   1. PROGNOSTIC INDICATORS Results: HER2 negative  Estrogen Receptor: 80%, POSITIVE, STRONG STAINING INTENSITY Progesterone Receptor: 0%, NEGATIVE Proliferation Marker Ki67: 35%    04/30/2019 Initial Diagnosis   Malignant neoplasm of upper-outer quadrant of left breast in female, estrogen receptor positive ([name])    Genetic Testing   No pathogenic variants identified. 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 The final report date is 05/28/2019.  The STAT Breast cancer panel offered by [facility] includes sequencing and rearrangement analysis for the following 9 genes:  ATM, BRCA1, BRCA2, CDH1, CHEK2, PALB2, PTEN, STK11 and TP53.    The Common Hereditary Cancers Panel offered by [facility] includes sequencing and/or deletion duplication testing of the following 48 genes: APC, ATM, AXIN2, BARD1, BMPR1A, BRCA1, BRCA2, BRIP1, CDH1, CDKN2A (p14ARF), CDKN2A (p16INK4a), CKD4, CHEK2, CTNNA1, DICER1, EPCAM (Deletion/duplication testing only), GREM1 (promoter region deletion/duplication testing only), KIT, MEN1, MLH1, MSH2, MSH3, MSH6, MUTYH, NBN, NF1, NHTL1, PALB2, PDGFRA, PMS2, POLD1, POLE, PTEN, RAD50, RAD51C, RAD51D, RNF43, SDHB, SDHC, SDHD, SMAD4, SMARCA4. STK11, TP53, TSC1, TSC2, and VHL.  The following genes were evaluated for sequence changes only: SDHA and HOXB13 c.251G>A variant only.   05/11/2019 Breast MRI   IMPRESSION: 1. Enhancing mass and architectural distortion spanning approximately 6 x 3 x 5.3 cm in the upper outer left breast consistent with the patient's biopsy-proven site of invasive  cancer. 2. Biopsy changes in the anterior left breast at the  site of the patient's biopsy-proven ductal carcinoma in situ. 3. Suspicious changes within the upper-outer quadrant of the left breast spanning a total of 8 cm in the AP dimension. This includes the site of biopsy-proven DCIS anteriorly and suspicious enhancement extending to the level of the pectoralis muscle posteriorly. 4. Suspicious 7 mm enhancing mass in the far posterior slightly lateral left breast (image 138/212). 5. No MRI evidence of malignancy on the right. 6. No suspicious lymphadenopathy.     05/26/2019 -  Anti-estrogen oral therapy   Tamoxifen 13m daily starting 05/26/19. Stopped before surgery and chemo on 07/13/19. Restart in 12/2019. Due to insomnia and nausea, reduced dose to 10 in 05/2020.  ---------Started her on Ovarian suppression Zoladex on 06/15/20.  ----------Switched to Letrozole in 07/2020.    07/13/2019 Surgery   LEFT NIPPLE SPARING MASTECTOMY WITH SENTINEL LYMPH NODE BIOPSY and LEFT BREAST RECONSTRUCTION WITH PLACEMENT OF TISSUE EXPANDER AND ALLODERM by D.r Tsuie and Dr TIran Planas    07/13/2019 Pathology Results   SURGICAL PATHOLOGY   FINAL MICROSCOPIC DIAGNOSIS:   A. LYMPH NODE, LEFT AXILLARY #1, SENTINEL, BIOPSY:  - One of one lymph nodes negative for carcinoma (0/1).   B. BREAST, LEFT, MASTECTOMY:  - Invasive ductal carcinoma, grade 3, spanning 3.4 cm.  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  - Invasive carcinoma is 0.2 cm from the posterior margin focally (not on  ink) and 0.4 cm from the  anterior margin focally (not on ink).  - In situ carcinoma is 0.4 cm from the posterior margin focally (not on  ink).  - Biopsy site x2.  - See oncology table.   C. NIPPLE, LEFT, BIOPSY:  - Benign nipple tissue.   D. LYMPH NODE, LEFT AXILLARY #2, SENTINEL, BIOPSY:  - One of one lymph nodes negative for carcinoma (0/1).     GROUP 1:  HER2 **POSITIVE**    07/13/2019 Oncotype testing   Ocotype score 56 with 39% risk of recurrence with  Tmaoxifen or AI alone. There is a 15% benefit from adjuvant chemo.     07/13/2019 Cancer Staging   Staging form: Breast, AJCC 8th Edition - Pathologic stage from 07/13/2019: Stage IIA (pT2, pN0, cM0, G3, ER+, PR-, HER2+, Oncotype DX score: 56) - Signed by FTruitt Merle MD on 08/20/2019   08/25/2019 Echocardiogram   Baseline Echo  IMPRESSIONS     1. Left ventricular ejection fraction, by estimation, is 65 to 70%. The  left ventricle has normal function. The left ventricle has no regional  wall motion abnormalities. Left ventricular diastolic parameters were  normal.   2. Right ventricular systolic function is normal. The right ventricular  size is normal. There is normal pulmonary artery systolic pressure.   3. Left atrial size was mildly dilated.   4. The mitral valve is normal in structure and function. Trivial mitral  valve regurgitation.   5. The aortic valve is tricuspid. Aortic valve regurgitation is not  visualized.   6. The inferior vena cava is normal in size with greater than 50%  respiratory variability, suggesting right atrial pressure of 3 mmHg.   7. The aortic valve is not well seen. It is trileaflet. There appears to  be some calcification associated with the non-coronary cusp but this is  not well seen.    08/25/2019 Imaging   Whole body Bone Scan  IMPRESSION: No significant abnormality identified.   08/25/2019 Imaging   CT CAP  w Contrast  IMPRESSION: 1. No findings of metastatic disease to the chest, abdomen, or pelvis. 2. 0.7 by 0.3 cm sclerotic lesion in the right seventh rib laterally is likely benign but merits surveillance. 3. Mild sclerosis along the sacroiliac joints bilaterally, query mild chronic sacroiliitis.     09/01/2019 Procedure   INSERTION PORT-A-CATH WITH ULTRASOUND GUIDANCE by Dr. Georgette Dover   09/02/2019 -  Chemotherapy   TCH (Taxol, Carboplatin, Herceptin) q3weeks for 6 cycles starting 09/02/19-12/16/19, followed by anti-HER2 maintenance therapy with  Herceptin q3weeks starting 01/06/20 to complete 1 year of treatment (from 08/2019)     05/05/2020 Surgery   REMOVAL OF LEFT CHEST TISSUE EXPANDER AND PLACEMENT OF IMPLANT and RIGHT BREAST MASTOPEXY and LIPOFILLING FROM ABDOMEN TO RIGHT CHEST by Dr Iran Planas   Invasive ductal carcinoma of breast, female, left ([place])  07/13/2019 Initial Diagnosis   Invasive ductal carcinoma of breast, female, left ([place])      CURRENT THERAPY:  -Tamoxifen 5m daily starting 05/26/19. Stopped before surgery and chemo on 07/13/19. Restart in 12/2019.Due to insomnia and nausea, reduced dose to 10 in 05/2020. Started her on Ovarian suppression Zoladex on 06/15/20. Switched to Letrozole in 07/2020.  -TMescalq3weeks for 6 cycles starting 09/02/19-12/16/19, followed by maintenance Herceptinstarting 7/15/21to complete one year therapy(from 08/2019).  INTERVAL HISTORY:  Victoria Poseyis here for a follow up of left breast cancer. She presents to the clinic alone. She is tolerating letrozole better than tamoxifen, hot flush is moderate but manageable, no join pain or other complains.  She denies neuropathy, has good appetite and energy level.  Weight is stable.  All other systems were reviewed with the patient and are negative.  MEDICAL HISTORY:  Past Medical History:  Diagnosis Date  . Breast cancer ([facility] [place]   Left Mastectomy  . Cancer ([facility]    Left breast Ca  . GERD (gastroesophageal reflux disease)   . Personal history of chemotherapy 08/2019-11/2019  . Scoliosis   . Sickle cell trait ([facility]     SURGICAL HISTORY: Past Surgical History:  Procedure Laterality Date  . BREAST RECONSTRUCTION WITH PLACEMENT OF TISSUE EXPANDER AND ALLODERM Left 07/13/2019   Procedure: LEFT BREAST RECONSTRUCTION WITH PLACEMENT OF TISSUE EXPANDER AND ALLODERM;  Surgeon: TIrene Limbo MD;  [facility]  Service: Plastics;  Laterality: Left;  . DEBRIDEMENT AND CLOSURE WOUND Left 07/30/2019   Procedure: DEBRIDEMENT LEFT  MASTECTOMY FLAP;  Surgeon: TIrene Limbo MD;  [facility] [facility]  Service: Plastics;  Laterality: Left;  . INDUCED ABORTION  2017  . LIPOSUCTION WITH LIPOFILLING Right 05/05/2020   Procedure: LIPOFILLING FROM ABDOMEN TO RIGHT CHEST;  Surgeon: TIrene Limbo MD;  [facility]  Service: Plastics;  Laterality: Right;  . MASTECTOMY Left 07/13/2019  . MASTOPEXY Right 05/05/2020   Procedure: RIGHT BREAST MASTOPEXY;  Surgeon: TIrene Limbo MD;  [place] [facility]  Service: Plastics;  Laterality: Right;  . NIPPLE SPARING MASTECTOMY WITH SENTINEL LYMPH NODE BIOPSY Left 07/13/2019   Procedure: LEFT NIPPLE SPARING MASTECTOMY WITH SENTINEL LYMPH NODE BIOPSY;  Surgeon: TDonnie Mesa MD;  [facility]  Service: General;  Laterality: Left;  . PORTACATH PLACEMENT Right 09/01/2019   Procedure: INSERTION PORT-A-CATH WITH ULTRASOUND GUIDANCE;  Surgeon: TDonnie Mesa MD;  [facility]  Service: General;  Laterality: Right;  . REMOVAL OF TISSUE EXPANDER AND PLACEMENT OF IMPLANT Left 05/05/2020   Procedure: REMOVAL OF LEFT CHEST TISSUE EXPANDER AND PLACEMENT OF IMPLANT;  Surgeon: TIrene Limbo MD;  Location: [facility];  Service: Plastics;  Laterality: Left;  . TISSUE EXPANDER PLACEMENT Left 07/30/2019   Procedure: TISSUE EXPANSION LEFT CHEST;  Surgeon: Irene Limbo, MD;  Location: [facility];  Service: Plastics;  Laterality: Left;    I have reviewed the social history and family history with the patient and they are unchanged from previous note.  ALLERGIES:  is allergic to bactrim [sulfamethoxazole-trimethoprim].  MEDICATIONS:  Current Outpatient Medications  Medication Sig Dispense Refill  . dexlansoprazole (DEXILANT) 60 MG capsule Take 60 mg by mouth daily.    . Lactobacillus-Inulin ([employer] PO) Take 1 capsule by mouth daily as needed (digestive  health.).     Marland Kitchen letrozole (FEMARA) 2.5 MG tablet Take 1 tablet (2.5 mg total) by mouth daily. 30 tablet 3  . lidocaine-prilocaine (EMLA) cream Apply 1 application topically as needed. 30 g 0  . venlafaxine XR (EFFEXOR-XR) 37.5 MG 24 hr capsule Take 1 capsule (37.5 mg total) by mouth daily with breakfast. 30 capsule 3   No current facility-administered medications for this visit.    PHYSICAL EXAMINATION: ECOG PERFORMANCE STATUS: 0 - Asymptomatic  Vitals:   08/24/20 0928  BP: 125/75  Pulse: 77  Resp: 16  Temp: 99 F (37.2 C)  SpO2: 99%   Filed Weights   08/24/20 0928  Weight: 136 lb 6 oz (61.9 kg)   GENERAL:alert, no distress and comfortable SKIN: skin color, texture, turgor are normal, no rashes or significant lesions EYES: normal, Conjunctiva are pink and non-injected, sclera clear Musculoskeletal:no cyanosis of digits and no clubbing  NEURO: alert & oriented x 3 with fluent speech, no focal motor/sensory deficits Breast exam deferred today.  LABORATORY DATA:  I have reviewed the data as listed CBC Latest Ref Rng & Units 08/24/2020 05/11/2020 03/09/2020  WBC 4.0 - 10.5 K/uL 5.3 5.7 5.1  Hemoglobin 12.0 - 15.0 g/dL 13.0 10.8(L) 12.6  Hematocrit 36.0 - 46.0 % 38.2 30.9(L) 37.1  Platelets 150 - 400 K/uL 205 197 191     CMP Latest Ref Rng & Units 08/24/2020 05/11/2020 03/09/2020  Glucose 70 - 99 mg/dL 122(H) 125(H) 86  BUN 6 - 20 mg/dL _0 Creatinine 0.44 - 1.00 mg/dL 0.81 0.77 0.79  Sodium 135 - 145 mmol/L 139 138 140  Potassium 3.5 - 5.1 mmol/L 3.8 3.9 4.0  Chloride 98 - 111 mmol/L 106 105 107  CO2 22 - 32 mmol/L _1 Calcium 8.9 - 10.3 mg/dL 9.2 8.8(L) 8.8(L)  Total Protein 6.5 - 8.1 g/dL 6.9 6.5 6.8  Total Bilirubin 0.3 - 1.2 mg/dL 0.3 0.5 0.4  Alkaline Phos 38 - 126 U/L 69 54 69  AST 15 - 41 U/L _2 ALT 0 - 44 U/L _3 RADIOGRAPHIC STUDIES: I have personally reviewed the radiological images as listed and agreed with the findings in the  report. No results found.   ASSESSMENT & PLAN:  Victoria Barker is a 40 y.o. female with    1Malignant neoplasm of upper-outer quadrant of left breast,invasive ductal carcinoma and DCIS, stageIIA,pT2N0M0, ER+/PR-/HER2+, GradeIII -She was diagnosed in 04/2019. She was found to have2 left breast masses with DCIS and 1 mass with Invasive ductal carcinoma, her abnormal lymph node was biopsied and found to be negative. -On 07/13/19 she underwent left breast mastectomy with Dr. Prince Solian and reconstruction with Dr. Iran Planas.Tumorwas found to be HER2 positiveonresected sample and Oncotypetesting(ordered before HER2 came back positive)withrecurrence score  of 56. -To reduce her high risk of recurrenceshe completed 6 cycles ofadjuvantchemotherapy with TCH (09/02/19-12/16/19). She will continue withmaintenance Herceptinto complete one year therapywith last dose today  -Given node negativedisease andmastectomy, Dr Isidore Moos did not recommend adjuvant radiation. -She restarted Tamoxifen in 12/2019.Due to insomnia and intermittent nausea she can reduce to 76m but could not tolerate and stopped in early Jan 2022.  -She proceeded with ovarian suppression Zoladex injections on 06/15/20, will continue every 4 weeks until her BSO. I switched her to Letrozole in 07/2020, she is tolerating better, hot flashes manageable. -I discussed the role of adjuvant neratinib in HER-2 positive early stage breast cancer, which reduce recurrence by to 2-3% in 5 years, but the benefit is more in ER positive breast cancer.  I reviewed the potential side effects, especially diarrhea and the management.  She agrees to try. -She plans to have constrain surgery in the next month or two -I asked the patient to see if she wants to have BSO with breast reconstruction on same day, she will think about it. -f/u in 3 months to start neratinib  2. Hot Flashes -She has had worsened hot flashes day and night which are effecting  her sleep. -I started her on Effexor in 12/2019.Her hot flushes has much improved. she is tolerating low-dose 37.5 mg daily well, I recommend her to increase dose to 75 mg daily due to persistent moderate hot flashes.  3. Genetictesting negative for pathogenetic mutations.   PLAN: -She will continue letrozole -Increase Effexor to 75 mg daily -We will proceed trastuzumab infusion today, which is the last infusion  -continue Zoladex injection every 4 weeks, next in 2 weeks  -She plans to have breast reconstruction in the next few months, I will reach out to Dr. TIran Planasif she can have BSO on same day  -Lab and follow-up in 3 months, plan to start neratinib on next visit  No problem-specific Assessment & Plan notes found for this encounter.   No orders of the defined types were placed in this encounter.  All questions were answered. The patient knows to call the clinic with any problems, questions or concerns. No barriers to learning was detected. The total time spent in the appointment was 30 minutes.     YTruitt Merle MD 08/24/2020   I, AJoslyn Devon am acting as scribe for YTruitt Merle MD.   I have reviewed the above documentation for accuracy and completeness, and I agree with the above.

## 2020-08-24 ENCOUNTER — Inpatient Hospital Stay: Payer: BC Managed Care – PPO | Attending: Hematology

## 2020-08-24 ENCOUNTER — Telehealth: Payer: Self-pay | Admitting: Hematology

## 2020-08-24 ENCOUNTER — Inpatient Hospital Stay: Payer: BC Managed Care – PPO

## 2020-08-24 ENCOUNTER — Encounter: Payer: Self-pay | Admitting: Hematology

## 2020-08-24 ENCOUNTER — Inpatient Hospital Stay (HOSPITAL_BASED_OUTPATIENT_CLINIC_OR_DEPARTMENT_OTHER): Payer: BC Managed Care – PPO | Admitting: Hematology

## 2020-08-24 ENCOUNTER — Other Ambulatory Visit: Payer: Self-pay

## 2020-08-24 ENCOUNTER — Inpatient Hospital Stay: Payer: BC Managed Care – PPO | Admitting: Nutrition

## 2020-08-24 ENCOUNTER — Encounter: Payer: Self-pay | Admitting: *Deleted

## 2020-08-24 VITALS — BP 125/75 | HR 77 | Temp 99.0°F | Resp 16 | Wt 136.4 lb

## 2020-08-24 DIAGNOSIS — Z79899 Other long term (current) drug therapy: Secondary | ICD-10-CM | POA: Insufficient documentation

## 2020-08-24 DIAGNOSIS — C50912 Malignant neoplasm of unspecified site of left female breast: Secondary | ICD-10-CM

## 2020-08-24 DIAGNOSIS — Z5111 Encounter for antineoplastic chemotherapy: Secondary | ICD-10-CM | POA: Insufficient documentation

## 2020-08-24 DIAGNOSIS — Z170 Estrogen receptor positive status [ER+]: Secondary | ICD-10-CM

## 2020-08-24 DIAGNOSIS — Z5112 Encounter for antineoplastic immunotherapy: Secondary | ICD-10-CM | POA: Diagnosis present

## 2020-08-24 DIAGNOSIS — C50412 Malignant neoplasm of upper-outer quadrant of left female breast: Secondary | ICD-10-CM

## 2020-08-24 DIAGNOSIS — Z95828 Presence of other vascular implants and grafts: Secondary | ICD-10-CM

## 2020-08-24 LAB — CMP (CANCER CENTER ONLY)
ALT: 17 U/L (ref 0–44)
AST: 19 U/L (ref 15–41)
Albumin: 3.7 g/dL (ref 3.5–5.0)
Alkaline Phosphatase: 69 U/L (ref 38–126)
Anion gap: 8 (ref 5–15)
BUN: 10 mg/dL (ref 6–20)
CO2: 25 mmol/L (ref 22–32)
Calcium: 9.2 mg/dL (ref 8.9–10.3)
Chloride: 106 mmol/L (ref 98–111)
Creatinine: 0.81 mg/dL (ref 0.44–1.00)
GFR, Estimated: >60 mL/min (ref >60)
Glucose, Bld: 122 mg/dL — ABNORMAL HIGH (ref 70–99)
Potassium: 3.8 mmol/L (ref 3.5–5.1)
Sodium: 139 mmol/L (ref 135–145)
Total Bilirubin: 0.3 mg/dL (ref 0.3–1.2)
Total Protein: 6.9 g/dL (ref 6.5–8.1)

## 2020-08-24 LAB — CBC WITH DIFFERENTIAL (CANCER CENTER ONLY)
Abs Immature Granulocytes: 0.01 10*3/uL (ref 0.00–0.07)
Basophils Absolute: 0.1 10*3/uL (ref 0.0–0.1)
Basophils Relative: 1 %
Eosinophils Absolute: 0.1 10*3/uL (ref 0.0–0.5)
Eosinophils Relative: 2 %
HCT: 38.2 % (ref 36.0–46.0)
Hemoglobin: 13 g/dL (ref 12.0–15.0)
Immature Granulocytes: 0 %
Lymphocytes Relative: 43 %
Lymphs Abs: 2.3 10*3/uL (ref 0.7–4.0)
MCH: 29.6 pg (ref 26.0–34.0)
MCHC: 34 g/dL (ref 30.0–36.0)
MCV: 87 fL (ref 80.0–100.0)
Monocytes Absolute: 0.3 10*3/uL (ref 0.1–1.0)
Monocytes Relative: 5 %
Neutro Abs: 2.6 10*3/uL (ref 1.7–7.7)
Neutrophils Relative %: 49 %
Platelet Count: 205 10*3/uL (ref 150–400)
RBC: 4.39 MIL/uL (ref 3.87–5.11)
RDW: 12.2 % (ref 11.5–15.5)
WBC Count: 5.3 10*3/uL (ref 4.0–10.5)
nRBC: 0 % (ref 0.0–0.2)

## 2020-08-24 LAB — PREGNANCY, URINE: Preg Test, Ur: NEGATIVE

## 2020-08-24 MED ORDER — SODIUM CHLORIDE 0.9 % IV SOLN
Freq: Once | INTRAVENOUS | Status: AC
  Filled 2020-08-24: qty 250

## 2020-08-24 MED ORDER — SODIUM CHLORIDE 0.9% FLUSH
10.0000 mL | INTRAVENOUS | Status: DC | PRN
  Administered 2020-08-24: 10 mL
  Filled 2020-08-24: qty 10

## 2020-08-24 MED ORDER — SODIUM CHLORIDE 0.9% FLUSH
10.0000 mL | Freq: Once | INTRAVENOUS | Status: AC
  Administered 2020-08-24: 10 mL
  Filled 2020-08-24: qty 10

## 2020-08-24 MED ORDER — PROCHLORPERAZINE MALEATE 10 MG PO TABS
10.0000 mg | ORAL_TABLET | Freq: Once | ORAL | Status: AC
  Administered 2020-08-24: 10 mg via ORAL

## 2020-08-24 MED ORDER — HEPARIN SOD (PORK) LOCK FLUSH 100 UNIT/ML IV SOLN
500.0000 [IU] | Freq: Once | INTRAVENOUS | Status: AC | PRN
  Administered 2020-08-24: 500 [IU]
  Filled 2020-08-24: qty 5

## 2020-08-24 MED ORDER — ACETAMINOPHEN 325 MG PO TABS
ORAL_TABLET | ORAL | Status: AC
  Filled 2020-08-24: qty 2

## 2020-08-24 MED ORDER — TRASTUZUMAB-DKST CHEMO 150 MG IV SOLR
6.0000 mg/kg | Freq: Once | INTRAVENOUS | Status: AC
  Administered 2020-08-24: 357 mg via INTRAVENOUS
  Filled 2020-08-24: qty 17

## 2020-08-24 MED ORDER — PROCHLORPERAZINE MALEATE 10 MG PO TABS
ORAL_TABLET | ORAL | Status: AC
  Filled 2020-08-24: qty 1

## 2020-08-24 MED ORDER — DIPHENHYDRAMINE HCL 25 MG PO CAPS
25.0000 mg | ORAL_CAPSULE | Freq: Once | ORAL | Status: AC
  Administered 2020-08-24: 25 mg via ORAL

## 2020-08-24 MED ORDER — ACETAMINOPHEN 325 MG PO TABS
650.0000 mg | ORAL_TABLET | Freq: Once | ORAL | Status: AC
  Administered 2020-08-24: 650 mg via ORAL

## 2020-08-24 MED ORDER — DIPHENHYDRAMINE HCL 25 MG PO CAPS
ORAL_CAPSULE | ORAL | Status: AC
  Filled 2020-08-24: qty 1

## 2020-08-24 NOTE — Patient Instructions (Signed)
[facility] Discharge Instructions for Patients Receiving Chemotherapy  Today you received the following chemotherapy agents Traztuzumab  To help prevent nausea and vomiting after your treatment, we encourage you to take your nausea medication as directed.    If you develop nausea and vomiting that is not controlled by your nausea medication, call the clinic.   BELOW ARE SYMPTOMS THAT SHOULD BE REPORTED IMMEDIATELY:  *FEVER GREATER THAN 100.5 F  *CHILLS WITH OR WITHOUT FEVER  NAUSEA AND VOMITING THAT IS NOT CONTROLLED WITH YOUR NAUSEA MEDICATION  *UNUSUAL SHORTNESS OF BREATH  *UNUSUAL BRUISING OR BLEEDING  TENDERNESS IN MOUTH AND THROAT WITH OR WITHOUT PRESENCE OF ULCERS  *URINARY PROBLEMS  *BOWEL PROBLEMS  UNUSUAL RASH Items with * indicate a potential emergency and should be followed up as soon as possible.  Feel free to call the clinic should you have any questions or concerns. The clinic phone number is [phone].  Please show the CHEMO ALERT CARD at check-in to the Emergency Department and triage nurse.   

## 2020-08-24 NOTE — Progress Notes (Signed)
Consult received for patient diagnosed with Breast cancer. Patient reports she doesn't need a nutrition consult. She is requesting a letter from her MD stating recommendations for ensure. Patient is drinking several ensure daily. Currently her weight is stable and within her UBW. Notified RN who provided letter which I gave to patient. No further interventions needed.

## 2020-08-24 NOTE — Telephone Encounter (Signed)
Scheduled follow-up appointments per 3/3 los. Patient is aware. 

## 2020-09-07 ENCOUNTER — Inpatient Hospital Stay: Payer: BC Managed Care – PPO

## 2020-09-07 ENCOUNTER — Other Ambulatory Visit: Payer: Self-pay

## 2020-09-07 VITALS — BP 130/86 | HR 99 | Temp 98.4°F | Resp 18

## 2020-09-07 DIAGNOSIS — Z95828 Presence of other vascular implants and grafts: Secondary | ICD-10-CM

## 2020-09-07 DIAGNOSIS — C50412 Malignant neoplasm of upper-outer quadrant of left female breast: Secondary | ICD-10-CM | POA: Diagnosis not present

## 2020-09-07 MED ORDER — GOSERELIN ACETATE 3.6 MG ~~LOC~~ IMPL
3.6000 mg | DRUG_IMPLANT | Freq: Once | SUBCUTANEOUS | Status: AC
  Administered 2020-09-07: 3.6 mg via SUBCUTANEOUS

## 2020-09-07 MED ORDER — GOSERELIN ACETATE 3.6 MG ~~LOC~~ IMPL
DRUG_IMPLANT | SUBCUTANEOUS | Status: AC
  Filled 2020-09-07: qty 3.6

## 2020-09-07 NOTE — Patient Instructions (Signed)

## 2020-09-14 NOTE — H&P (Signed)
Subjective:     Patient ID: Victoria Barker is a 40 y.o. female.  HPI  4 months post op. Since last visit stopped her tamoxifen, now on letrozole. Plan f/u May 2022 for start neratinib. Discussed BSO with Dr. Burr Medico.  Presented with palpable left breast mass. Diagnositic MMG/US showed mass at 1 o clock, 4 cmfn, 2.4 x 2.4 x 1.8 cm and pleomorphic calcifications in the upper-outer quadrant. A single enlarged LN noted. 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 Biopsy LN benign.  MRI showed biopsy-proven site of invasive cancer 3.3 x 2.9 x 3.3 cm with surrounding NME overall area measures up to 6.0 x 3 x 5.3 cm. NME to the level of the pectoralis muscle. Biopsy clip in the UOQ at site of biopsy-proven DCIS is > 8 cm from clip to the posterior edge of the enhancing mass/NME. An additional 7 x 5 x 5 mm mass is identified in posterior, lateral left breast, abuting the pectoralis. No abnormal appearing lymph nodes. No additional biopsy completed as plan for mastectomy.   Final pathology 3.4 cm IDC with high grade DCIS, margins clear, 0/2 SLN. Post mastectomy course complicated by mastectomy flap necrosis and underwent debridement, closure.  Genetics noted VUS in [place]. Oncotype 56. Patient completed adjuvant chemotherapy 6.24.21. Completed Herceptin 08/2020.  MMG RIGHT 04/20/2020- underwent US for possible mass and was felt to be cyst  Prior 34 C. Left mastectomy 355 g. Right mastopexy 74 g resection.   Stopped vaping prior to surgery.  Works for [employer] in [place]. Lives with 2 kids.  Review of Systems     Objective:   Physical Exam  Cardiovascular: Normal rate, regular rhythm and normal heart sounds.  Pulmonary/Chest: Effort normal and breath sounds normal.  Abdominal: Soft.   Chest:   Right chest port Scars maturing soft bilateral Right> left volume over lateral breast especially, left lateral chest  depression contour lateral to implant SN to nipple R 23 L 25 cm BW R 19 cm L 17 cm Nipple to IMF R 8 L 10 cm  No lateral malposition implant in supine position With bending forward faint rippling present over lateral implant mostly     Assessment:     Left breast ca UOQ ER+ S/p left NSM, SLN, prepectoral TE/ADM (Alloderm) reconstruction Skin flap necrosis s/p debridement/closure Adjuvant chemotherapy S/p left silicone implant exchange, lipofilling, right breast mastopexy    Plan:     Herceptin completed and desires removal port.  Right breast hematoma resolved, still fuller right lateral breast. Discussed revision right reduction with excision soft tissue, possible liposuction over lateral breast. No drains anticipated for this.  Reviewed NAC position asymmetry would be difficult to change. Reviewed long term, right NAC position will descend with aging. We agree to defer any attempts to change left NAC position.  Plan revision left reconstruction with larger implant. She does have rippling in setting Soft Touch implant and prior fat grafting. Discussed options additional fat grafting. Patient declines. Discussed there is an implant choice that is firmer, highly cohesive gel, that may offer less rippling. Agrees to try this. Provided Natrelle Inspira Reconstruction handout including physician patient checklist.   Additional risks including bleeding seroma hematoma need for additional procedures blood clots in legs or lungs damage to adjacent structures reviewed.    LEFT Natrelle Soft Touch Smooth Round Full Profile 385 ml implant REF [id number]  70 ml fat infiltrated over left mastectomy flap  and chest

## 2020-09-19 ENCOUNTER — Other Ambulatory Visit: Payer: Self-pay

## 2020-09-19 ENCOUNTER — Encounter (HOSPITAL_BASED_OUTPATIENT_CLINIC_OR_DEPARTMENT_OTHER): Payer: Self-pay | Admitting: Plastic Surgery

## 2020-09-22 ENCOUNTER — Other Ambulatory Visit (HOSPITAL_COMMUNITY)
Admission: RE | Admit: 2020-09-22 | Discharge: 2020-09-22 | Disposition: A | Discharge status: Home / Self Care | Payer: BC Managed Care – PPO | Source: Ambulatory Visit | Attending: Plastic Surgery | Admitting: Plastic Surgery

## 2020-09-22 DIAGNOSIS — Z20822 Contact with and (suspected) exposure to covid-19: Secondary | ICD-10-CM | POA: Diagnosis not present

## 2020-09-22 DIAGNOSIS — Z01812 Encounter for preprocedural laboratory examination: Secondary | ICD-10-CM | POA: Diagnosis present

## 2020-09-22 NOTE — Progress Notes (Signed)

## 2020-09-23 LAB — SARS CORONAVIRUS 2 (TAT 6-24 HRS): SARS Coronavirus 2: NEGATIVE

## 2020-09-25 ENCOUNTER — Encounter (HOSPITAL_BASED_OUTPATIENT_CLINIC_OR_DEPARTMENT_OTHER): Payer: Self-pay | Admitting: Plastic Surgery

## 2020-09-25 NOTE — Anesthesia Preprocedure Evaluation (Addendum)
Anesthesia Evaluation  Patient identified by MRN, date of birth, ID band Patient awake    Reviewed: Allergy & Precautions, NPO status , Patient's Chart, lab work & pertinent test results  Airway Mallampati: I  TM Distance: >3 FB Neck ROM: Full    Dental no notable dental hx. (+) Teeth Intact   Pulmonary Patient abstained from smoking., former smoker,    Pulmonary exam normal breath sounds clear to auscultation       Cardiovascular negative cardio ROS Normal cardiovascular exam Rhythm:Regular Rate:Normal     Neuro/Psych negative neurological ROS  negative psych ROS   GI/Hepatic Neg liver ROS, GERD  ,  Endo/Other  Hx/o left breast Ca S/P mastectomy S/P ChemoRx Acquired absence both breasts  Renal/GU negative Renal ROS  negative genitourinary   Musculoskeletal negative musculoskeletal ROS (+)   Abdominal   Peds  Hematology  (+) Sickle cell trait ,   Anesthesia Other Findings   Reproductive/Obstetrics                            Anesthesia Physical Anesthesia Plan  ASA: II  Anesthesia Plan: General   Post-op Pain Management:    Induction: Intravenous  PONV Risk Score and Plan: 4 or greater and Scopolamine patch - Pre-op, Treatment may vary due to age or medical condition, Midazolam, Dexamethasone and Ondansetron  Airway Management Planned: Oral ETT  Additional Equipment:   Intra-op Plan:   Post-operative Plan: Extubation in OR  Informed Consent: I have reviewed the patients History and Physical, chart, labs and discussed the procedure including the risks, benefits and alternatives for the proposed anesthesia with the patient or authorized representative who has indicated his/her understanding and acceptance.     Dental advisory given  Plan Discussed with: Anesthesiologist and CRNA  Anesthesia Plan Comments:        Anesthesia Quick Evaluation

## 2020-09-26 ENCOUNTER — Encounter (HOSPITAL_BASED_OUTPATIENT_CLINIC_OR_DEPARTMENT_OTHER)
Admission: RE | Disposition: A | Discharge status: Home / Self Care | Payer: Self-pay | Source: Home / Self Care | Attending: Plastic Surgery

## 2020-09-26 ENCOUNTER — Encounter (HOSPITAL_BASED_OUTPATIENT_CLINIC_OR_DEPARTMENT_OTHER): Payer: Self-pay | Admitting: Plastic Surgery

## 2020-09-26 ENCOUNTER — Other Ambulatory Visit: Payer: Self-pay

## 2020-09-26 ENCOUNTER — Ambulatory Visit (HOSPITAL_BASED_OUTPATIENT_CLINIC_OR_DEPARTMENT_OTHER): Payer: BC Managed Care – PPO | Admitting: Anesthesiology

## 2020-09-26 ENCOUNTER — Ambulatory Visit (HOSPITAL_BASED_OUTPATIENT_CLINIC_OR_DEPARTMENT_OTHER)
Admission: RE | Admit: 2020-09-26 | Discharge: 2020-09-26 | Disposition: A | Discharge status: Home / Self Care | Payer: BC Managed Care – PPO | Attending: Plastic Surgery | Admitting: Plastic Surgery

## 2020-09-26 DIAGNOSIS — Z9012 Acquired absence of left breast and nipple: Secondary | ICD-10-CM | POA: Insufficient documentation

## 2020-09-26 DIAGNOSIS — Z452 Encounter for adjustment and management of vascular access device: Secondary | ICD-10-CM | POA: Insufficient documentation

## 2020-09-26 DIAGNOSIS — Z853 Personal history of malignant neoplasm of breast: Secondary | ICD-10-CM | POA: Diagnosis not present

## 2020-09-26 HISTORY — PX: BREAST REDUCTION SURGERY: SHX8

## 2020-09-26 HISTORY — PX: PORT-A-CATH REMOVAL: SHX5289

## 2020-09-26 HISTORY — PX: BREAST IMPLANT EXCHANGE: SHX6296

## 2020-09-26 LAB — POCT PREGNANCY, URINE: Preg Test, Ur: NEGATIVE

## 2020-09-26 SURGERY — REMOVAL PORT-A-CATH
Anesthesia: General | Site: Chest | Laterality: Right

## 2020-09-26 MED ORDER — SUGAMMADEX SODIUM 200 MG/2ML IV SOLN
INTRAVENOUS | Status: DC | PRN
  Administered 2020-09-26: 150 mg via INTRAVENOUS

## 2020-09-26 MED ORDER — HYDROMORPHONE HCL 1 MG/ML IJ SOLN
INTRAMUSCULAR | Status: AC
  Filled 2020-09-26: qty 0.5

## 2020-09-26 MED ORDER — FENTANYL CITRATE (PF) 100 MCG/2ML IJ SOLN
INTRAMUSCULAR | Status: AC
  Filled 2020-09-26: qty 2

## 2020-09-26 MED ORDER — CEFAZOLIN SODIUM-DEXTROSE 2-4 GM/100ML-% IV SOLN
INTRAVENOUS | Status: AC
  Filled 2020-09-26: qty 100

## 2020-09-26 MED ORDER — FENTANYL CITRATE (PF) 100 MCG/2ML IJ SOLN
INTRAMUSCULAR | Status: DC | PRN
  Administered 2020-09-26 (×2): 50 ug via INTRAVENOUS

## 2020-09-26 MED ORDER — OXYCODONE HCL 5 MG PO TABS: 5.0000 mg | ORAL_TABLET | Freq: Once | ORAL | Status: DC | PRN

## 2020-09-26 MED ORDER — PROPOFOL 500 MG/50ML IV EMUL
INTRAVENOUS | Status: DC | PRN
  Administered 2020-09-26: 25 ug/kg/min via INTRAVENOUS

## 2020-09-26 MED ORDER — SCOPOLAMINE 1 MG/3DAYS TD PT72
1.0000 | MEDICATED_PATCH | TRANSDERMAL | Status: DC
  Administered 2020-09-26: 1.5 mg via TRANSDERMAL

## 2020-09-26 MED ORDER — AMISULPRIDE (ANTIEMETIC) 5 MG/2ML IV SOLN: 10.0000 mg | Freq: Once | INTRAVENOUS | Status: DC | PRN

## 2020-09-26 MED ORDER — OXYCODONE HCL 5 MG/5ML PO SOLN: 5.0000 mg | Freq: Once | ORAL | Status: DC | PRN

## 2020-09-26 MED ORDER — PHENYLEPHRINE HCL (PRESSORS) 10 MG/ML IV SOLN
INTRAVENOUS | Status: DC | PRN
  Administered 2020-09-26 (×3): 80 ug via INTRAVENOUS

## 2020-09-26 MED ORDER — CEFAZOLIN SODIUM-DEXTROSE 2-4 GM/100ML-% IV SOLN
2.0000 g | INTRAVENOUS | Status: AC
  Administered 2020-09-26: 2 g via INTRAVENOUS

## 2020-09-26 MED ORDER — LACTATED RINGERS IV SOLN: INTRAVENOUS | Status: DC

## 2020-09-26 MED ORDER — DEXAMETHASONE SODIUM PHOSPHATE 4 MG/ML IJ SOLN
INTRAMUSCULAR | Status: DC | PRN
  Administered 2020-09-26: 5 mg via INTRAVENOUS

## 2020-09-26 MED ORDER — ROCURONIUM BROMIDE 10 MG/ML (PF) SYRINGE
PREFILLED_SYRINGE | INTRAVENOUS | Status: AC
  Filled 2020-09-26: qty 10

## 2020-09-26 MED ORDER — MIDAZOLAM HCL 2 MG/2ML IJ SOLN
INTRAMUSCULAR | Status: AC
  Filled 2020-09-26: qty 2

## 2020-09-26 MED ORDER — CHLORHEXIDINE GLUCONATE CLOTH 2 % EX PADS: 6.0000 | MEDICATED_PAD | Freq: Once | CUTANEOUS | Status: DC

## 2020-09-26 MED ORDER — OXYCODONE HCL 5 MG PO TABS: 5.0000 mg | ORAL_TABLET | ORAL | 0 refills | Status: DC | PRN

## 2020-09-26 MED ORDER — SODIUM CHLORIDE (PF) 0.9 % IJ SOLN
INTRAMUSCULAR | Status: AC
  Filled 2020-09-26: qty 20

## 2020-09-26 MED ORDER — PROPOFOL 10 MG/ML IV BOLUS
INTRAVENOUS | Status: DC | PRN
  Administered 2020-09-26: 150 mg via INTRAVENOUS

## 2020-09-26 MED ORDER — ONDANSETRON HCL 4 MG/2ML IJ SOLN
INTRAMUSCULAR | Status: DC | PRN
  Administered 2020-09-26: 4 mg via INTRAVENOUS

## 2020-09-26 MED ORDER — ONDANSETRON HCL 4 MG/2ML IJ SOLN: 4.0000 mg | Freq: Once | INTRAMUSCULAR | Status: DC | PRN

## 2020-09-26 MED ORDER — PROPOFOL 500 MG/50ML IV EMUL
INTRAVENOUS | Status: AC
  Filled 2020-09-26: qty 50

## 2020-09-26 MED ORDER — HYDROMORPHONE HCL 1 MG/ML IJ SOLN
0.2500 mg | INTRAMUSCULAR | Status: DC | PRN
  Administered 2020-09-26 (×4): 0.5 mg via INTRAVENOUS

## 2020-09-26 MED ORDER — GABAPENTIN 300 MG PO CAPS
300.0000 mg | ORAL_CAPSULE | ORAL | Status: AC
  Administered 2020-09-26: 300 mg via ORAL

## 2020-09-26 MED ORDER — LIDOCAINE-EPINEPHRINE 1 %-1:100000 IJ SOLN
INTRAMUSCULAR | Status: AC
  Filled 2020-09-26: qty 1

## 2020-09-26 MED ORDER — CELECOXIB 200 MG PO CAPS
200.0000 mg | ORAL_CAPSULE | ORAL | Status: AC
  Administered 2020-09-26: 200 mg via ORAL

## 2020-09-26 MED ORDER — SODIUM CHLORIDE 0.9 % IV SOLN
INTRAVENOUS | Status: DC | PRN
  Administered 2020-09-26: 500 mL

## 2020-09-26 MED ORDER — ACETAMINOPHEN 500 MG PO TABS
ORAL_TABLET | ORAL | Status: AC
  Filled 2020-09-26: qty 2

## 2020-09-26 MED ORDER — DEXAMETHASONE SODIUM PHOSPHATE 10 MG/ML IJ SOLN
INTRAMUSCULAR | Status: AC
  Filled 2020-09-26: qty 1

## 2020-09-26 MED ORDER — SCOPOLAMINE 1 MG/3DAYS TD PT72
MEDICATED_PATCH | TRANSDERMAL | Status: AC
  Filled 2020-09-26: qty 1

## 2020-09-26 MED ORDER — GABAPENTIN 300 MG PO CAPS
ORAL_CAPSULE | ORAL | Status: AC
  Filled 2020-09-26: qty 1

## 2020-09-26 MED ORDER — LIDOCAINE HCL (CARDIAC) PF 100 MG/5ML IV SOSY
PREFILLED_SYRINGE | INTRAVENOUS | Status: DC | PRN
  Administered 2020-09-26: 60 mg via INTRAVENOUS

## 2020-09-26 MED ORDER — BUPIVACAINE-EPINEPHRINE (PF) 0.25% -1:200000 IJ SOLN
INTRAMUSCULAR | Status: AC
  Filled 2020-09-26: qty 30

## 2020-09-26 MED ORDER — ACETAMINOPHEN 500 MG PO TABS
1000.0000 mg | ORAL_TABLET | ORAL | Status: AC
  Administered 2020-09-26: 1000 mg via ORAL

## 2020-09-26 MED ORDER — CELECOXIB 200 MG PO CAPS
ORAL_CAPSULE | ORAL | Status: AC
  Filled 2020-09-26: qty 1

## 2020-09-26 MED ORDER — MIDAZOLAM HCL 5 MG/5ML IJ SOLN
INTRAMUSCULAR | Status: DC | PRN
  Administered 2020-09-26: 2 mg via INTRAVENOUS

## 2020-09-26 MED ORDER — BUPIVACAINE-EPINEPHRINE 0.25% -1:200000 IJ SOLN
INTRAMUSCULAR | Status: DC | PRN
  Administered 2020-09-26: 15 mL

## 2020-09-26 MED ORDER — PHENYLEPHRINE 40 MCG/ML (10ML) SYRINGE FOR IV PUSH (FOR BLOOD PRESSURE SUPPORT)
PREFILLED_SYRINGE | INTRAVENOUS | Status: AC
  Filled 2020-09-26: qty 10

## 2020-09-26 MED ORDER — ROCURONIUM BROMIDE 100 MG/10ML IV SOLN
INTRAVENOUS | Status: DC | PRN
  Administered 2020-09-26: 70 mg via INTRAVENOUS

## 2020-09-26 MED ORDER — ONDANSETRON HCL 4 MG/2ML IJ SOLN
INTRAMUSCULAR | Status: AC
  Filled 2020-09-26: qty 2

## 2020-09-26 MED ORDER — DOXYCYCLINE HYCLATE 100 MG PO CAPS: 100.0000 mg | ORAL_CAPSULE | Freq: Two times a day (BID) | ORAL | 0 refills | Status: DC

## 2020-09-26 MED ORDER — LIDOCAINE 2% (20 MG/ML) 5 ML SYRINGE
INTRAMUSCULAR | Status: AC
  Filled 2020-09-26: qty 5

## 2020-09-26 SURGICAL SUPPLY — 93 items
ADH SKN CLS APL DERMABOND .7 (GAUZE/BANDAGES/DRESSINGS) ×3
APL PRP STRL LF DISP 70% ISPRP (MISCELLANEOUS) ×6
APL SKNCLS STERI-STRIP NONHPOA (GAUZE/BANDAGES/DRESSINGS)
APPLIER CLIP 9.375 MED OPEN (MISCELLANEOUS)
APR CLP MED 9.3 20 MLT OPN (MISCELLANEOUS)
BAG DECANTER FOR FLEXI CONT (MISCELLANEOUS) ×4 IMPLANT
BENZOIN TINCTURE PRP APPL 2/3 (GAUZE/BANDAGES/DRESSINGS) IMPLANT
BINDER BREAST 3XL (GAUZE/BANDAGES/DRESSINGS) IMPLANT
BINDER BREAST LRG (GAUZE/BANDAGES/DRESSINGS) ×4 IMPLANT
BINDER BREAST MEDIUM (GAUZE/BANDAGES/DRESSINGS) IMPLANT
BINDER BREAST XLRG (GAUZE/BANDAGES/DRESSINGS) IMPLANT
BINDER BREAST XXLRG (GAUZE/BANDAGES/DRESSINGS) IMPLANT
BLADE CLIPPER SURG (BLADE) IMPLANT
BLADE SURG 10 STRL SS (BLADE) ×16 IMPLANT
BLADE SURG 15 STRL LF DISP TIS (BLADE) ×3 IMPLANT
BLADE SURG 15 STRL SS (BLADE) ×4
BNDG GAUZE ELAST 4 BULKY (GAUZE/BANDAGES/DRESSINGS) ×8 IMPLANT
CANISTER SUCT 1200ML W/VALVE (MISCELLANEOUS) ×4 IMPLANT
CHLORAPREP W/TINT 26 (MISCELLANEOUS) ×8 IMPLANT
CLIP APPLIE 9.375 MED OPEN (MISCELLANEOUS) IMPLANT
CLIP VESOCCLUDE MED 6/CT (CLIP) IMPLANT
COVER BACK TABLE 60X90IN (DRAPES) ×4 IMPLANT
COVER MAYO STAND STRL (DRAPES) ×4 IMPLANT
COVER WAND RF STERILE (DRAPES) IMPLANT
DECANTER SPIKE VIAL GLASS SM (MISCELLANEOUS) IMPLANT
DERMABOND ADVANCED (GAUZE/BANDAGES/DRESSINGS) ×1
DERMABOND ADVANCED .7 DNX12 (GAUZE/BANDAGES/DRESSINGS) ×3 IMPLANT
DRAIN CHANNEL 15F RND FF W/TCR (WOUND CARE) IMPLANT
DRAPE INCISE IOBAN 66X45 STRL (DRAPES) IMPLANT
DRAPE LAPAROTOMY 100X72 PEDS (DRAPES) IMPLANT
DRAPE TOP ARMCOVERS (MISCELLANEOUS) ×4 IMPLANT
DRAPE U-SHAPE 76X120 STRL (DRAPES) ×4 IMPLANT
DRAPE UTILITY XL STRL (DRAPES) ×4 IMPLANT
DRSG PAD ABDOMINAL 8X10 ST (GAUZE/BANDAGES/DRESSINGS) ×8 IMPLANT
ELECT BLADE 4.0 EZ CLEAN MEGAD (MISCELLANEOUS)
ELECT COATED BLADE 2.86 ST (ELECTRODE) ×4 IMPLANT
ELECT REM PT RETURN 9FT ADLT (ELECTROSURGICAL) ×4
ELECTRODE BLDE 4.0 EZ CLN MEGD (MISCELLANEOUS) IMPLANT
ELECTRODE REM PT RTRN 9FT ADLT (ELECTROSURGICAL) ×3 IMPLANT
EVACUATOR SILICONE 100CC (DRAIN) IMPLANT
GAUZE SPONGE 4X4 12PLY STRL LF (GAUZE/BANDAGES/DRESSINGS) IMPLANT
GLOVE SURG ENC MOIS LTX SZ6.5 (GLOVE) IMPLANT
GLOVE SURG HYDRASOFT LTX SZ5.5 (GLOVE) ×8 IMPLANT
GLOVE SURG UNDER POLY LF SZ6.5 (GLOVE) IMPLANT
GOWN STRL REUS W/ TWL LRG LVL3 (GOWN DISPOSABLE) ×6 IMPLANT
GOWN STRL REUS W/TWL LRG LVL3 (GOWN DISPOSABLE) ×8
IMPL BREAST FULL SHL 450 (Breast) ×3 IMPLANT
IMPL BRST FULL SHL 450CC (Breast) ×3 IMPLANT
IMPLANT BREAST GEL 450CC (Breast) ×4 IMPLANT
IV NS 500ML (IV SOLUTION)
IV NS 500ML BAXH (IV SOLUTION) IMPLANT
KIT FILL SYSTEM UNIVERSAL (SET/KITS/TRAYS/PACK) IMPLANT
MARKER SKIN DUAL TIP RULER LAB (MISCELLANEOUS) IMPLANT
NEEDLE FILTER BLUNT 18X 1/2SAF (NEEDLE)
NEEDLE FILTER BLUNT 18X1 1/2 (NEEDLE) IMPLANT
NEEDLE HYPO 25X1 1.5 SAFETY (NEEDLE) IMPLANT
NEEDLE HYPO 30GX1 BEV (NEEDLE) IMPLANT
NEEDLE PRECISIONGLIDE 27X1.5 (NEEDLE) ×4 IMPLANT
NS IRRIG 1000ML POUR BTL (IV SOLUTION) ×4 IMPLANT
PACK BASIN DAY SURGERY FS (CUSTOM PROCEDURE TRAY) ×4 IMPLANT
PENCIL SMOKE EVACUATOR (MISCELLANEOUS) ×4 IMPLANT
PIN SAFETY STERILE (MISCELLANEOUS) IMPLANT
SHEET MEDIUM DRAPE 40X70 STRL (DRAPES) ×4 IMPLANT
SIZER BREAST REUSE SRF 450CC (SIZER) ×4
SIZER BRST REUSE SRF 450CC (SIZER) ×3 IMPLANT
SLEEVE SCD COMPRESS KNEE MED (STOCKING) ×4 IMPLANT
SPONGE GAUZE 2X2 8PLY STRL LF (GAUZE/BANDAGES/DRESSINGS) IMPLANT
SPONGE LAP 18X18 RF (DISPOSABLE) ×16 IMPLANT
STAPLER INSORB 30 2030 C-SECTI (MISCELLANEOUS) IMPLANT
STAPLER VISISTAT 35W (STAPLE) ×4 IMPLANT
STRIP CLOSURE SKIN 1/2X4 (GAUZE/BANDAGES/DRESSINGS) IMPLANT
SUT ETHILON 2 0 FS 18 (SUTURE) IMPLANT
SUT MNCRL AB 4-0 PS2 18 (SUTURE) ×8 IMPLANT
SUT PDS 3-0 CT2 (SUTURE)
SUT PDS AB 2-0 CT2 27 (SUTURE) ×4 IMPLANT
SUT PDS II 3-0 CT2 27 ABS (SUTURE) IMPLANT
SUT VIC AB 3-0 PS1 18 (SUTURE) ×4
SUT VIC AB 3-0 PS1 18XBRD (SUTURE) ×3 IMPLANT
SUT VIC AB 3-0 SH 27 (SUTURE) ×4
SUT VIC AB 3-0 SH 27X BRD (SUTURE) ×3 IMPLANT
SUT VICRYL 4-0 PS2 18IN ABS (SUTURE) ×4 IMPLANT
SYR 20ML LL LF (SYRINGE) IMPLANT
SYR 50ML LL SCALE MARK (SYRINGE) IMPLANT
SYR BULB EAR ULCER 3OZ GRN STR (SYRINGE) IMPLANT
SYR BULB IRRIG 60ML STRL (SYRINGE) ×8 IMPLANT
SYR CONTROL 10ML LL (SYRINGE) ×4 IMPLANT
TAPE MEASURE VINYL STERILE (MISCELLANEOUS) IMPLANT
TOWEL GREEN STERILE FF (TOWEL DISPOSABLE) ×8 IMPLANT
TRAY DSU PREP LF (CUSTOM PROCEDURE TRAY) IMPLANT
TRAY FOLEY W/BAG SLVR 14FR LF (SET/KITS/TRAYS/PACK) IMPLANT
TUBE CONNECTING 20X1/4 (TUBING) ×8 IMPLANT
UNDERPAD 30X36 HEAVY ABSORB (UNDERPADS AND DIAPERS) ×8 IMPLANT
YANKAUER SUCT BULB TIP NO VENT (SUCTIONS) ×4 IMPLANT

## 2020-09-26 NOTE — Transfer of Care (Signed)
Immediate Anesthesia Transfer of Care Note  Patient: Victoria Barker  Procedure(s) Performed: REMOVAL RIGHT CHEST PORT (Right Chest) REVISION RIGHT BREAST REDUCTION (Right Breast) REVISION LEFT BREAST RECONSTRUCTION WITH SILICONE IMPLANT EXCHANGE (Left Breast)  Patient Location: PACU  Anesthesia Type:General  Level of Consciousness: drowsy, patient cooperative and responds to stimulation  Airway & Oxygen Therapy: Patient Spontanous Breathing and Patient connected to face mask oxygen  Post-op Assessment: Report given to RN and Post -op Vital signs reviewed and stable  Post vital signs: Reviewed and stable  Last Vitals:  Vitals Value Taken Time  BP 149/92 09/26/20 0932  Temp    Pulse 96 09/26/20 0933  Resp 16 09/26/20 0933  SpO2 100 % 09/26/20 0933  Vitals shown include unvalidated device data.  Last Pain:  Vitals:   09/26/20 0658  TempSrc: Oral  PainSc: 0-No pain      Patients Stated Pain Goal: 5 (09/64/38 3818)  Complications: No complications documented.

## 2020-09-26 NOTE — Op Note (Signed)
Operative Note   DATE OF OPERATION: 4.5.22  LOCATION: [facility]-outpatient  SURGICAL DIVISION: Plastic Surgery  PREOPERATIVE DIAGNOSES:  1. History breast cancer 2. Acquired absence breast   POSTOPERATIVE DIAGNOSES:  same  PROCEDURE:  1. Revision left breast reconstruction with silicone implant exchange 2. Revision right breast reduction 3. Removal right chest port  SURGEON: Irene Limbo MD MBA  ASSISTANT: none  ANESTHESIA:  General.   EBL: 25 ml  COMPLICATIONS: None immediate.   INDICATIONS FOR PROCEDURE:  The patient, Victoria Barker, is a 40 y.o. female born on 09-17-1980, is here for revision breast reconstruction and removal port. She has undergone left nipple sparing mastectomy with prepectoral implant based reconstruction.   FINDINGS: Right breast resection 26 g. Removed intact smooth silicone implant from left chest. Natrelle Cohesive gel smooth round Full Profile 450 ml implant placed in left chest. REF SCF-450 SN [id number]  DESCRIPTION OF PROCEDURE:  The patient's operative site was marked with the patient in the preoperative area. The patient was taken to the operating room. SCDs were placed and IV antibiotics were given. The patient's operative site was prepped and draped in a sterile fashion. A time out was performed and all information was confirmed to be correct.  Incision made in right chest scar over port. Incision carried through superficial fascia and port capsule incised. Sutures removed. Port removed intact. Closure completed with 3-0 vicryl in superficial fascia and dermis followed by 4-0 monocryl subcuticular. Incision made in left inframammary fold scar and carried through superficial fascia and capsule. Implant removed. Thermal capsulorraphy performed at medial and lateral capsule. Sizer place. Over right breast, elliptical skin excision designed at inframammary fold scar and skin and lateral breast tissue excised. Local anesthetic infiltrated. Patient  brought to upright sitting position. A 450 ml full profile implant selected for left chest. Patient returned to supine position. Over right breast, resection cavity plicated with 2-0 PDS suture. Skin closure completed with 3-0 vicryl in dermis, 4-0 monocryl subcuticular skin closure. Over left breast, cavity irrigated with saline solution containing Ancef gentamicin and betadine. Implant placed in cavity and orientation ensured. Closure completed with 3-0 vicryl in superficial fascia and capsule, 4-0 vicryl in dermis, 4-0 monocryl subcuticular skin closure. Dermabond applied to incisions. Dry dressing and breast binder applied.   The patient was allowed to wake from anesthesia, extubated and taken to the recovery room in satisfactory condition.   SPECIMENS: right breast reduction  DRAINS: none

## 2020-09-26 NOTE — Anesthesia Postprocedure Evaluation (Signed)
Anesthesia Post Note  Patient: Radio producer  Procedure(s) Performed: REMOVAL RIGHT CHEST PORT (Right Chest) REVISION RIGHT BREAST REDUCTION (Right Breast) REVISION LEFT BREAST RECONSTRUCTION WITH SILICONE IMPLANT EXCHANGE (Left Breast)     Patient location during evaluation: PACU Anesthesia Type: General Level of consciousness: awake and alert Pain management: pain level controlled Vital Signs Assessment: post-procedure vital signs reviewed and stable Respiratory status: spontaneous breathing, nonlabored ventilation and respiratory function stable Cardiovascular status: blood pressure returned to baseline and stable Postop Assessment: no apparent nausea or vomiting Anesthetic complications: no   No complications documented.  Last Vitals:  Vitals:   09/26/20 1000 09/26/20 1015  BP: (!) 150/84 126/85  Pulse: 90 87  Resp: 19 16  Temp:    SpO2: 100% 100%    Last Pain:  Vitals:   09/26/20 1000  TempSrc:   PainSc: 5                  Naidelin Gugliotta A.

## 2020-09-26 NOTE — Anesthesia Procedure Notes (Signed)
Procedure Name: Intubation Date/Time: 09/26/2020 7:38 AM Performed by: Glory Buff, CRNA Pre-anesthesia Checklist: Patient identified, Emergency Drugs available, Suction available and Patient being monitored Patient Re-evaluated:Patient Re-evaluated prior to induction Oxygen Delivery Method: Circle system utilized Preoxygenation: Pre-oxygenation with 100% oxygen Induction Type: IV induction Ventilation: Mask ventilation without difficulty Laryngoscope Size: Miller and 3 Grade View: Grade I Tube type: Oral Tube size: 7.0 mm Number of attempts: 1 Airway Equipment and Method: Stylet and Oral airway Placement Confirmation: ETT inserted through vocal cords under direct vision,  positive ETCO2 and breath sounds checked- equal and bilateral Secured at: 20 cm Tube secured with: Tape Dental Injury: Teeth and Oropharynx as per pre-operative assessment

## 2020-09-26 NOTE — Interval H&P Note (Signed)
History and Physical Interval Note:  09/26/2020 7:04 AM  Victoria Barker  has presented today for surgery, with the diagnosis of history breast cancer, acquired absence breasts.  The various methods of treatment have been discussed with the patient and family. After consideration of risks, benefits and other options for treatment, the patient has consented to  Procedure(s): REMOVAL RIGHT CHEST PORT (Right) REVISION RIGHT BREAST REDUCTION (Right) REVISION LEFT BREAST RECONSTRUCTION WITH SILICONE IMPLANT EXCHANGE (Left) as a surgical intervention.  The patient's history has been reviewed, patient examined, no change in status, stable for surgery.  I have reviewed the patient's chart and labs.  Questions were answered to the patient's satisfaction.     Arnoldo Hooker Rickell Wiehe

## 2020-09-26 NOTE — Discharge Instructions (Signed)
  Post Anesthesia Home Care Instructions  Activity: Get plenty of rest for the remainder of the day. A responsible individual must stay with you for 24 hours following the procedure.  For the next 24 hours, DO NOT: -Drive a car -Paediatric nurse -Drink alcoholic beverages -Take any medication unless instructed by your physician -Make any legal decisions or sign important papers.  Meals: Start with liquid foods such as gelatin or soup. Progress to regular foods as tolerated. Avoid greasy, spicy, heavy foods. If nausea and/or vomiting occur, drink only clear liquids until the nausea and/or vomiting subsides. Call your physician if vomiting continues.  Special Instructions/Symptoms: Your throat may feel dry or sore from the anesthesia or the breathing tube placed in your throat during surgery. If this causes discomfort, gargle with warm salt water. The discomfort should disappear within 24 hours.  If you had a scopolamine patch placed behind your ear for the management of post- operative nausea and/or vomiting:  1. The medication in the patch is effective for 72 hours, after which it should be removed.  Wrap patch in a tissue and discard in the trash. Wash hands thoroughly with soap and water. 2. You may remove the patch earlier than 72 hours if you experience unpleasant side effects which may include dry mouth, dizziness or visual disturbances. 3. Avoid touching the patch. Wash your hands with soap and water after contact with the patch.    *May have Tylenol at 1pm today 09/26/20 and Ibuprofen at 3pm today 09/26/20.

## 2020-09-27 ENCOUNTER — Encounter (HOSPITAL_BASED_OUTPATIENT_CLINIC_OR_DEPARTMENT_OTHER): Payer: Self-pay | Admitting: Plastic Surgery

## 2020-09-27 LAB — SURGICAL PATHOLOGY

## 2020-10-05 ENCOUNTER — Other Ambulatory Visit: Payer: Self-pay | Admitting: Hematology

## 2020-10-05 ENCOUNTER — Inpatient Hospital Stay: Payer: BC Managed Care – PPO | Attending: Hematology

## 2020-10-05 ENCOUNTER — Other Ambulatory Visit: Payer: Self-pay

## 2020-10-05 VITALS — BP 122/76 | HR 102

## 2020-10-05 DIAGNOSIS — Z5112 Encounter for antineoplastic immunotherapy: Secondary | ICD-10-CM | POA: Diagnosis present

## 2020-10-05 DIAGNOSIS — Z5111 Encounter for antineoplastic chemotherapy: Secondary | ICD-10-CM | POA: Insufficient documentation

## 2020-10-05 DIAGNOSIS — Z95828 Presence of other vascular implants and grafts: Secondary | ICD-10-CM

## 2020-10-05 DIAGNOSIS — C50412 Malignant neoplasm of upper-outer quadrant of left female breast: Secondary | ICD-10-CM | POA: Insufficient documentation

## 2020-10-05 DIAGNOSIS — Z79899 Other long term (current) drug therapy: Secondary | ICD-10-CM | POA: Diagnosis not present

## 2020-10-05 MED ORDER — GOSERELIN ACETATE 3.6 MG ~~LOC~~ IMPL
3.6000 mg | DRUG_IMPLANT | Freq: Once | SUBCUTANEOUS | Status: AC
  Administered 2020-10-05: 3.6 mg via SUBCUTANEOUS

## 2020-10-05 MED ORDER — GOSERELIN ACETATE 3.6 MG ~~LOC~~ IMPL
DRUG_IMPLANT | SUBCUTANEOUS | Status: AC
  Filled 2020-10-05: qty 3.6

## 2020-11-02 ENCOUNTER — Encounter: Payer: Self-pay | Admitting: Hematology

## 2020-11-02 ENCOUNTER — Inpatient Hospital Stay: Payer: BC Managed Care – PPO

## 2020-11-02 ENCOUNTER — Telehealth: Payer: Self-pay | Admitting: Pharmacist

## 2020-11-02 ENCOUNTER — Other Ambulatory Visit (HOSPITAL_COMMUNITY): Payer: Self-pay

## 2020-11-02 ENCOUNTER — Inpatient Hospital Stay: Payer: BC Managed Care – PPO | Attending: Hematology

## 2020-11-02 ENCOUNTER — Inpatient Hospital Stay (HOSPITAL_BASED_OUTPATIENT_CLINIC_OR_DEPARTMENT_OTHER): Payer: BC Managed Care – PPO | Admitting: Hematology

## 2020-11-02 ENCOUNTER — Other Ambulatory Visit: Payer: Self-pay

## 2020-11-02 VITALS — BP 115/86 | HR 89 | Temp 98.1°F | Resp 20 | Ht 62.0 in | Wt 135.1 lb

## 2020-11-02 DIAGNOSIS — Z5111 Encounter for antineoplastic chemotherapy: Secondary | ICD-10-CM | POA: Diagnosis present

## 2020-11-02 DIAGNOSIS — Z95828 Presence of other vascular implants and grafts: Secondary | ICD-10-CM

## 2020-11-02 DIAGNOSIS — C50412 Malignant neoplasm of upper-outer quadrant of left female breast: Secondary | ICD-10-CM | POA: Diagnosis present

## 2020-11-02 DIAGNOSIS — Z170 Estrogen receptor positive status [ER+]: Secondary | ICD-10-CM

## 2020-11-02 DIAGNOSIS — Z79899 Other long term (current) drug therapy: Secondary | ICD-10-CM | POA: Insufficient documentation

## 2020-11-02 LAB — CBC WITH DIFFERENTIAL (CANCER CENTER ONLY)
Abs Immature Granulocytes: 0.02 10*3/uL (ref 0.00–0.07)
Basophils Absolute: 0.1 10*3/uL (ref 0.0–0.1)
Basophils Relative: 1 %
Eosinophils Absolute: 0.1 10*3/uL (ref 0.0–0.5)
Eosinophils Relative: 2 %
HCT: 38.8 % (ref 36.0–46.0)
Hemoglobin: 12.9 g/dL (ref 12.0–15.0)
Immature Granulocytes: 0 %
Lymphocytes Relative: 43 %
Lymphs Abs: 2.8 10*3/uL (ref 0.7–4.0)
MCH: 29.6 pg (ref 26.0–34.0)
MCHC: 33.2 g/dL (ref 30.0–36.0)
MCV: 89 fL (ref 80.0–100.0)
Monocytes Absolute: 0.3 10*3/uL (ref 0.1–1.0)
Monocytes Relative: 5 %
Neutro Abs: 3.2 10*3/uL (ref 1.7–7.7)
Neutrophils Relative %: 49 %
Platelet Count: 237 10*3/uL (ref 150–400)
RBC: 4.36 MIL/uL (ref 3.87–5.11)
RDW: 12.8 % (ref 11.5–15.5)
WBC Count: 6.5 10*3/uL (ref 4.0–10.5)
nRBC: 0 % (ref 0.0–0.2)

## 2020-11-02 LAB — CMP (CANCER CENTER ONLY)
ALT: 10 U/L (ref 0–44)
AST: 17 U/L (ref 15–41)
Albumin: 3.9 g/dL (ref 3.5–5.0)
Alkaline Phosphatase: 85 U/L (ref 38–126)
Anion gap: 10 (ref 5–15)
BUN: 15 mg/dL (ref 6–20)
CO2: 28 mmol/L (ref 22–32)
Calcium: 9.5 mg/dL (ref 8.9–10.3)
Chloride: 103 mmol/L (ref 98–111)
Creatinine: 0.8 mg/dL (ref 0.44–1.00)
GFR, Estimated: >60 mL/min (ref >60)
Glucose, Bld: 112 mg/dL — ABNORMAL HIGH (ref 70–99)
Potassium: 3.9 mmol/L (ref 3.5–5.1)
Sodium: 141 mmol/L (ref 135–145)
Total Bilirubin: 0.3 mg/dL (ref 0.3–1.2)
Total Protein: 7.1 g/dL (ref 6.5–8.1)

## 2020-11-02 LAB — PREGNANCY, URINE: Preg Test, Ur: NEGATIVE

## 2020-11-02 MED ORDER — GOSERELIN ACETATE 3.6 MG ~~LOC~~ IMPL
3.6000 mg | DRUG_IMPLANT | Freq: Once | SUBCUTANEOUS | Status: AC
  Administered 2020-11-02: 3.6 mg via SUBCUTANEOUS

## 2020-11-02 MED ORDER — NERATINIB MALEATE 40 MG PO TABS
240.0000 mg | ORAL_TABLET | Freq: Every day | ORAL | 0 refills | Status: DC
  Filled 2020-11-02: qty 150, 25d supply, fill #0

## 2020-11-02 MED ORDER — VENLAFAXINE HCL ER 75 MG PO CP24: 75.0000 mg | ORAL_CAPSULE | Freq: Every day | ORAL | 3 refills | Status: DC

## 2020-11-02 MED ORDER — GOSERELIN ACETATE 3.6 MG ~~LOC~~ IMPL
DRUG_IMPLANT | SUBCUTANEOUS | Status: AC
  Filled 2020-11-02: qty 3.6

## 2020-11-02 MED ORDER — BUDESONIDE ER 9 MG PO CP24
9.0000 mg | ORAL_CAPSULE | Freq: Every day | ORAL | 0 refills | Status: DC
  Filled 2020-11-02 – 2020-11-06 (×2): qty 30, 30d supply, fill #0

## 2020-11-02 NOTE — Telephone Encounter (Addendum)
Oral Oncology Pharmacist Encounter  Received new prescription for Nerlynx (neratinib) for the adjuvant treatment of early stage, HER-2 positive breast cancer, planned duration 1 year.  Prescription dose and frequency assessed for appropriateness. Patient will be dose escalated to help improve tolerance of medication. Prescription quantity updated from #150 to #147 to match taper instructions for first 28 day fill of Nerlynx.  CBC w/ Diff and CMP from 11/02/20 assessed, labs stable for treatment initiation - no dose adjustments necessary. Pregnancy test from 11/02/20 negative.   Current medication list in Epic reviewed, DDIs with Nerlynx identified:  Concomitant use of Nerlynx and Dexlansoprazole or other PPIs is not recommended due to risk of the decrease in efficacy/serum concentrations of Nerlynx. Will ensure patient has discontinued PPI prior to starting Nerlynx.   Evaluated chart and no patient barriers to medication adherence noted.   Patient agreement for treatment documented in MD note on 11/02/20.  Prescription has been e-scribed to the [facility] for benefits analysis and approval.  Oral Oncology Clinic will continue to follow for insurance authorization, copayment issues, initial counseling and start date.  Leron Croak, PharmD, BCPS Hematology/Oncology Clinical Pharmacist [facility] [phone] 11/02/2020 12:20 PM

## 2020-11-02 NOTE — Progress Notes (Signed)
[facility]   Telephone:([phone]) [phone] Fax:([phone]) [phone]   Clinic Follow up Note   Patient Care Team: [name], MD as PCP - General (Obstetrics and Gynecology) Mauro Kaufmann, RN as Oncology Nurse Navigator Rockwell Germany, RN as Oncology Nurse Navigator Donnie Mesa, MD as Consulting Physician (General Surgery) Truitt Merle, MD as Consulting Physician (Hematology) Eppie Gibson, MD as Attending Physician (Radiation Oncology)  Date of Service:  11/02/2020  CHIEF COMPLAINT: f/u of left breast cancer  SUMMARY OF ONCOLOGIC HISTORY: Oncology History Overview Note  Cancer Staging Malignant neoplasm of upper-outer quadrant of left breast in female, estrogen receptor positive ([place]) Staging form: Breast, AJCC 8th Edition - Clinical stage from 04/28/2019: Stage IIA (cT2, cN0, cM0, G2, ER+, PR-, HER2: Equivocal) - Signed by Truitt Merle, MD on 05/04/2019 - Pathologic stage from 07/13/2019: Stage IIA (pT2, pN0, cM0, G3, ER+, PR-, HER2+, Oncotype DX score: 11) - Signed by Truitt Merle, MD on 08/20/2019    Malignant neoplasm of upper-outer quadrant of left breast in female, estrogen receptor positive ([place])  04/21/2019 Mammogram   Diagnostic mammogram 04/21/19  IMPRESSION 1. findings highly suspicious for left breast cancer. There is a palpable irregular mass 4cm from nipple measuring 2.4x2.4x1.8cm in the 1:00 position axis of the left breast and pleomorphic  calcifications in the upper-outer quadrant as described above.  2. single left 0.9x0.5cm axillary lymph node with a mild/borderline thickened cortex.    04/28/2019 Cancer Staging   Staging form: Breast, AJCC 8th Edition - Clinical stage from 04/28/2019: Stage IIA (cT2, cN0, cM0, G2, ER+, PR-, HER2: Equivocal) - Signed by Truitt Merle, MD on 05/04/2019   04/28/2019 Initial Biopsy   Diagnosis 04/28/19 1. Breast, left, needle core biopsy, 1 o'clock position - INVASIVE DUCTAL CARCINOMA. - DUCTAL CARCINOMA IN SITU. - SEE  COMMENT. 2. Lymph node, needle/core biopsy, left axilla - THERE IS NO EVIDENCE OF CARCINOMA IN 1 OF 1 LYMPH NODE (0/1). 3. Breast, left, needle core biopsy, upper, outer quadrant - DUCTAL CARCINOMA IN SITU WITH CALCIFICATIONS. - SEE COMMENT.   04/28/2019 Receptors her2   1. PROGNOSTIC INDICATORS Results: HER2 negative  Estrogen Receptor: 80%, POSITIVE, STRONG STAINING INTENSITY Progesterone Receptor: 0%, NEGATIVE Proliferation Marker Ki67: 35%    04/30/2019 Initial Diagnosis   Malignant neoplasm of upper-outer quadrant of left breast in female, estrogen receptor positive ([name])    Genetic Testing   No pathogenic variants identified. 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 The final report date is 05/28/2019.  The STAT Breast cancer panel offered by [facility] includes sequencing and rearrangement analysis for the following 9 genes:  ATM, BRCA1, BRCA2, CDH1, CHEK2, PALB2, PTEN, STK11 and TP53.    The Common Hereditary Cancers Panel offered by [facility] includes sequencing and/or deletion duplication testing of the following 48 genes: APC, ATM, AXIN2, BARD1, BMPR1A, BRCA1, BRCA2, BRIP1, CDH1, CDKN2A (p14ARF), CDKN2A (p16INK4a), CKD4, CHEK2, CTNNA1, DICER1, EPCAM (Deletion/duplication testing only), GREM1 (promoter region deletion/duplication testing only), KIT, MEN1, MLH1, MSH2, MSH3, MSH6, MUTYH, NBN, NF1, NHTL1, PALB2, PDGFRA, PMS2, POLD1, POLE, PTEN, RAD50, RAD51C, RAD51D, RNF43, SDHB, SDHC, SDHD, SMAD4, SMARCA4. STK11, TP53, TSC1, TSC2, and VHL.  The following genes were evaluated for sequence changes only: SDHA and HOXB13 c.251G>A variant only.   05/11/2019 Breast MRI   IMPRESSION: 1. Enhancing mass and architectural distortion spanning approximately 6 x 3 x 5.3 cm in the upper outer left breast consistent with the patient's biopsy-proven site of invasive  cancer. 2. Biopsy changes in the anterior left breast at the  site of the patient's biopsy-proven ductal carcinoma in situ. 3. Suspicious changes within the upper-outer quadrant of the left breast spanning a total of 8 cm in the AP dimension. This includes the site of biopsy-proven DCIS anteriorly and suspicious enhancement extending to the level of the pectoralis muscle posteriorly. 4. Suspicious 7 mm enhancing mass in the far posterior slightly lateral left breast (image 138/212). 5. No MRI evidence of malignancy on the right. 6. No suspicious lymphadenopathy.     05/26/2019 -  Anti-estrogen oral therapy   Tamoxifen 13m daily starting 05/26/19. Stopped before surgery and chemo on 07/13/19. Restart in 12/2019. Due to insomnia and nausea, reduced dose to 10 in 05/2020.  ---------Started her on Ovarian suppression Zoladex on 06/15/20.  ----------Switched to Letrozole in 07/2020.    07/13/2019 Surgery   LEFT NIPPLE SPARING MASTECTOMY WITH SENTINEL LYMPH NODE BIOPSY and LEFT BREAST RECONSTRUCTION WITH PLACEMENT OF TISSUE EXPANDER AND ALLODERM by D.r Tsuie and Dr TIran Planas    07/13/2019 Pathology Results   SURGICAL PATHOLOGY   FINAL MICROSCOPIC DIAGNOSIS:   A. LYMPH NODE, LEFT AXILLARY #1, SENTINEL, BIOPSY:  - One of one lymph nodes negative for carcinoma (0/1).   B. BREAST, LEFT, MASTECTOMY:  - Invasive ductal carcinoma, grade 3, spanning 3.4 cm.  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  - Invasive carcinoma is 0.2 cm from the posterior margin focally (not on  ink) and 0.4 cm from the  anterior margin focally (not on ink).  - In situ carcinoma is 0.4 cm from the posterior margin focally (not on  ink).  - Biopsy site x2.  - See oncology table.   C. NIPPLE, LEFT, BIOPSY:  - Benign nipple tissue.   D. LYMPH NODE, LEFT AXILLARY #2, SENTINEL, BIOPSY:  - One of one lymph nodes negative for carcinoma (0/1).     GROUP 1:  HER2 **POSITIVE**    07/13/2019 Oncotype testing   Ocotype score 56 with 39% risk of recurrence with  Tmaoxifen or AI alone. There is a 15% benefit from adjuvant chemo.     07/13/2019 Cancer Staging   Staging form: Breast, AJCC 8th Edition - Pathologic stage from 07/13/2019: Stage IIA (pT2, pN0, cM0, G3, ER+, PR-, HER2+, Oncotype DX score: 56) - Signed by FTruitt Merle MD on 08/20/2019   08/25/2019 Echocardiogram   Baseline Echo  IMPRESSIONS     1. Left ventricular ejection fraction, by estimation, is 65 to 70%. The  left ventricle has normal function. The left ventricle has no regional  wall motion abnormalities. Left ventricular diastolic parameters were  normal.   2. Right ventricular systolic function is normal. The right ventricular  size is normal. There is normal pulmonary artery systolic pressure.   3. Left atrial size was mildly dilated.   4. The mitral valve is normal in structure and function. Trivial mitral  valve regurgitation.   5. The aortic valve is tricuspid. Aortic valve regurgitation is not  visualized.   6. The inferior vena cava is normal in size with greater than 50%  respiratory variability, suggesting right atrial pressure of 3 mmHg.   7. The aortic valve is not well seen. It is trileaflet. There appears to  be some calcification associated with the non-coronary cusp but this is  not well seen.    08/25/2019 Imaging   Whole body Bone Scan  IMPRESSION: No significant abnormality identified.   08/25/2019 Imaging   CT CAP  w Contrast  IMPRESSION: 1. No findings of metastatic disease to the chest, abdomen, or pelvis. 2. 0.7 by 0.3 cm sclerotic lesion in the right seventh rib laterally is likely benign but merits surveillance. 3. Mild sclerosis along the sacroiliac joints bilaterally, query mild chronic sacroiliitis.     09/01/2019 Procedure   INSERTION PORT-A-CATH WITH ULTRASOUND GUIDANCE by Dr. Georgette Dover   09/02/2019 -  Chemotherapy   TCH (Taxol, Carboplatin, Herceptin) q3weeks for 6 cycles starting 09/02/19-12/16/19, followed by anti-HER2 maintenance therapy with  Herceptin q3weeks starting 01/06/20 to complete 1 year of treatment (from 08/2019)     05/05/2020 Surgery   REMOVAL OF LEFT CHEST TISSUE EXPANDER AND PLACEMENT OF IMPLANT and RIGHT BREAST MASTOPEXY and LIPOFILLING FROM ABDOMEN TO RIGHT CHEST by Dr Iran Planas   Invasive ductal carcinoma of breast, female, left ([place])  07/13/2019 Initial Diagnosis   Invasive ductal carcinoma of breast, female, left ([place])      CURRENT THERAPY:  -Tamoxifen $RemoveBef'20mg'FdDCIpxgUz$  daily starting 05/26/19. Stopped before surgery and chemo on 07/13/19. Restart in 12/2019.Due to insomnia and nausea, reduced dose to 10 in 05/2020. Started her on Ovarian suppression Zoladex on 06/15/20. Switched to Letrozole in 07/2020.  -[place] q3weeks for 6 cycles starting 09/02/19-12/16/19, followed by maintenance Herceptinstarting 7/15/21to complete one year therapy(from 08/2019).  INTERVAL HISTORY:  Victoria Barker is here for a follow up of breast cancer. She was last seen by me on 08/24/2020. She presents to the clinic alone. She notes her hair is growing back, but it's still short. She denies any residual effects from chemo.  She continues on Letrozole with good tolerance. She continues to have hot flashes but notes she has not increased her Effexor. She notes she discussed having BSO done at the same time as reconstruction, but this was felt to be too much per Dr. Iran Planas.  All other systems were reviewed with the patient and are negative.  MEDICAL HISTORY:  Past Medical History:  Diagnosis Date  . Breast cancer ([place]) 2020   Left Mastectomy  . Cancer ([place])    Left breast Ca  . GERD (gastroesophageal reflux disease)   . Personal history of chemotherapy 08/2019-11/2019  . Scoliosis   . Sickle cell trait ([place])     SURGICAL HISTORY: Past Surgical History:  Procedure Laterality Date  . BREAST IMPLANT EXCHANGE Left 09/26/2020   Procedure: REVISION LEFT BREAST RECONSTRUCTION WITH SILICONE IMPLANT EXCHANGE;  Surgeon: Irene Limbo, MD;   Location: [facility];  Service: Plastics;  Laterality: Left;  . BREAST RECONSTRUCTION WITH PLACEMENT OF TISSUE EXPANDER AND ALLODERM Left 07/13/2019   Procedure: LEFT BREAST RECONSTRUCTION WITH PLACEMENT OF TISSUE EXPANDER AND ALLODERM;  Surgeon: Irene Limbo, MD;  Location: [facility];  Service: Plastics;  Laterality: Left;  . BREAST REDUCTION SURGERY Right 09/26/2020   Procedure: REVISION RIGHT BREAST REDUCTION;  Surgeon: Irene Limbo, MD;  Location: [facility];  Service: Plastics;  Laterality: Right;  . DEBRIDEMENT AND CLOSURE WOUND Left 07/30/2019   Procedure: DEBRIDEMENT LEFT MASTECTOMY FLAP;  Surgeon: Irene Limbo, MD;  Location: [facility];  Service: Plastics;  Laterality: Left;  . INDUCED ABORTION  2017  . LIPOSUCTION WITH LIPOFILLING Right 05/05/2020   Procedure: LIPOFILLING FROM ABDOMEN TO RIGHT CHEST;  Surgeon: Irene Limbo, MD;  Location: [facility];  Service: Plastics;  Laterality: Right;  . MASTECTOMY Left 07/13/2019  . MASTOPEXY Right 05/05/2020   Procedure: RIGHT BREAST MASTOPEXY;  Surgeon: Irene Limbo, MD;  Location: [facility];  Service:  Plastics;  Laterality: Right;  . NIPPLE SPARING MASTECTOMY WITH SENTINEL LYMPH NODE BIOPSY Left 07/13/2019   Procedure: LEFT NIPPLE SPARING MASTECTOMY WITH SENTINEL LYMPH NODE BIOPSY;  Surgeon: Donnie Mesa, MD;  Location: [facility];  Service: General;  Laterality: Left;  . PORT-A-CATH REMOVAL Right 09/26/2020   Procedure: REMOVAL RIGHT CHEST PORT;  Surgeon: Irene Limbo, MD;  Location: [facility];  Service: Plastics;  Laterality: Right;  . PORTACATH PLACEMENT Right 09/01/2019   Procedure: INSERTION PORT-A-CATH WITH ULTRASOUND GUIDANCE;  Surgeon: Donnie Mesa, MD;  Location: [facility];  Service: General;  Laterality: Right;  . REMOVAL OF TISSUE EXPANDER AND PLACEMENT OF IMPLANT Left 05/05/2020   Procedure: REMOVAL OF LEFT  CHEST TISSUE EXPANDER AND PLACEMENT OF IMPLANT;  Surgeon: Irene Limbo, MD;  Location: [facility];  Service: Plastics;  Laterality: Left;  . TISSUE EXPANDER PLACEMENT Left 07/30/2019   Procedure: TISSUE EXPANSION LEFT CHEST;  Surgeon: Irene Limbo, MD;  Location: [facility];  Service: Plastics;  Laterality: Left;    I have reviewed the social history and family history with the patient and they are unchanged from previous note.  ALLERGIES:  is allergic to bactrim [sulfamethoxazole-trimethoprim].  MEDICATIONS:  Current Outpatient Medications  Medication Sig Dispense Refill  . Budesonide ER 9 MG CP24 Take 9 mg by mouth daily. 30 capsule 0  . Neratinib Maleate (NERLYNX) 40 MG tablet Take 6 tablets (240 mg total) by mouth daily. Start at 4 tabs daily for first week, if tolerates well, then increase to 5 tabs daily for second week and 6 tabs daily on third week then continue. Take with food. 150 tablet 0  . venlafaxine XR (EFFEXOR-XR) 75 MG 24 hr capsule Take 1 capsule (75 mg total) by mouth daily with breakfast. 30 capsule 3  . dexlansoprazole (DEXILANT) 60 MG capsule Take 60 mg by mouth daily.    . Lactobacillus-Inulin (Cora PO) Take 1 capsule by mouth daily as needed (digestive health.).     Marland Kitchen letrozole (FEMARA) 2.5 MG tablet Take 1 tablet (2.5 mg total) by mouth daily. 30 tablet 3   No current facility-administered medications for this visit.    PHYSICAL EXAMINATION: ECOG PERFORMANCE STATUS: 0 - Asymptomatic  Vitals:   11/02/20 0910  BP: 115/86  Pulse: 89  Resp: 20  Temp: 98.1 F (36.7 C)  SpO2: 98%   Filed Weights   11/02/20 0910  Weight: 135 lb 1.6 oz (61.3 kg)    GENERAL:alert, no distress and comfortable SKIN: skin color, texture, turgor are normal, no rashes or significant lesions EYES: normal, Conjunctiva are pink and non-injected, sclera clear LYMPH:  no palpable lymphadenopathy in the cervical, axillary   LUNGS: normal breathing effort Musculoskeletal:no cyanosis of digits and no clubbing  NEURO: alert & oriented x 3 with fluent speech, no focal motor/sensory deficits BREASTS: reconstruction incision healing well  LABORATORY DATA:  I have reviewed the data as listed CBC Latest Ref Rng & Units 11/02/2020 08/24/2020 05/11/2020  WBC 4.0 - 10.5 K/uL 6.5 5.3 5.7  Hemoglobin 12.0 - 15.0 g/dL 12.9 13.0 10.8(L)  Hematocrit 36.0 - 46.0 % 38.8 38.2 30.9(L)  Platelets 150 - 400 K/uL 237 205 197     CMP Latest Ref Rng & Units 11/02/2020 08/24/2020 05/11/2020  Glucose 70 - 99 mg/dL 112(H) 122(H) 125(H)  BUN 6 - 20 mg/dL $Remove'15 10 13  'YbSvQno$ Creatinine 0.44 - 1.00 mg/dL 0.80 0.81 0.77  Sodium 135 - 145 mmol/L 141 139 138  Potassium 3.5 - 5.1 mmol/L 3.9 3.8 3.9  Chloride 98 - 111 mmol/L 103 106 105  CO2 22 - 32 mmol/L $RemoveB'28 25 24  'xEHzcoMa$ Calcium 8.9 - 10.3 mg/dL 9.5 9.2 8.8(L)  Total Protein 6.5 - 8.1 g/dL 7.1 6.9 6.5  Total Bilirubin 0.3 - 1.2 mg/dL 0.3 0.3 0.5  Alkaline Phos 38 - 126 U/L 85 69 54  AST 15 - 41 U/L $Remo'17 19 15  'RvfMW$ ALT 0 - 44 U/L $Remo'10 17 8      'hleGd$ RADIOGRAPHIC STUDIES: I have personally reviewed the radiological images as listed and agreed with the findings in the report. No results found.   ASSESSMENT & PLAN:  Victoria Barker is a 40 y.o. female with   1.Malignant neoplasm of upper-outer quadrant of left breast,invasive ductal carcinoma and DCIS, stageIIA,pT2N0M0, ER+/PR-/HER2+, GradeIII -She was diagnosed in 04/2019. She was found to have2 left breast masses with DCIS and 1 mass with Invasive ductal carcinoma, her abnormal lymph node was biopsied and found to be negative. -On 07/13/19 she underwent left breast mastectomy with Dr. Prince Solian and reconstruction with Dr. Iran Planas.Tumorwas found to be HER2 positiveonresected sample and Oncotypetesting(ordered before HER2 came back positive)withrecurrence score of 56. -To reduce her high risk of recurrenceshe completed 6 cycles  ofadjuvantchemotherapy with TCH (09/02/19-12/16/19). She completed one year therapyin 08/2020 -Given node negativedisease andmastectomy, Dr Isidore Moos did not recommend adjuvant radiation. -She restarted Tamoxifen in 12/2019.Due to insomnia and intermittent nauseashe can reduce to $RemoveB'10mg'zgwggXoh$ but could not tolerate and stopped in early Jan 2022.I switched her to Letrozole in 07/2020, she is tolerating better, hot flashes manageable. -She proceeded with ovarian suppression Zoladex injections on 06/15/20, will continue every 4 weeks until her BSO.  -I discussed the role of adjuvant neratinib in HER-2 positive early stage breast cancer, which reduce recurrence by to 2-3% in 5 years, and the benefit is more in ER positive breast cancer.  I reviewed the potential side effects, especially diarrhea and the management. I gave her a handout for diarrhea managmeent. She agrees to try. She will take this for a year. -She is agreeable to come of the dexilant and instead use Tums and Pepcid as needed due to its interaction with neratinib.  2. Hot Flashes -She has had worsened hot flashes day and night which are effecting her sleep. -I started her on Effexor in 12/2019.Her hot flashes have improved but are still present. She continues on the 37.5 mg dose. I previous recommended she increase her dose. I will prescribe the 75 mg dose for her today.  3. Genetictesting negative for pathogenetic mutations.   PLAN: -She will continue letrozole -I refilled Effexor 75 mg daily -She will start neratinib when she receives it.  I called into [facility], she will start at 4 tablets daily, and increase by 1 tablets every week until full dose of 6 tabs ($Remov'240mg'BOGATR$ ) daily.  I also called in budesonide 9 mg daily for first months.  She will have Imodium at home, and start as soon as she has loose bowel movement. -proceed with Zoladex injection today and every 4 weeks -Lab and follow-up in 4 weeks     No problem-specific  Assessment & Plan notes found for this encounter.   No orders of the defined types were placed in this encounter.  All questions were answered. The patient knows to call the clinic with any problems, questions or concerns. No barriers to learning was detected. The total time spent in the appointment was 30 minutes.  Truitt Merle, MD 11/02/2020   I, Wilburn Mylar, am acting as scribe for Truitt Merle, MD.   I have reviewed the above documentation for accuracy and completeness, and I agree with the above.

## 2020-11-03 ENCOUNTER — Telehealth: Payer: Self-pay

## 2020-11-03 ENCOUNTER — Telehealth: Payer: Self-pay | Admitting: Hematology

## 2020-11-03 ENCOUNTER — Other Ambulatory Visit (HOSPITAL_COMMUNITY): Payer: Self-pay

## 2020-11-03 NOTE — Telephone Encounter (Signed)
Oral Oncology Patient Advocate Encounter  Received notification from [employer] that prior authorization for Nerlynx is required.  PA submitted on CoverMyMeds Key [id number] Status is pending  Oral Oncology Clinic will continue to follow.  [place] [place] Patient [place] Phone [phone] Fax [phone] 11/03/2020 8:46 AM

## 2020-11-03 NOTE — Telephone Encounter (Signed)
Oral Oncology Patient Advocate Encounter  Prior Authorization for Nerlynx has been approved.    PA# [id number] Effective dates: 11/02/20 through 11/02/21  Patients co-pay is $0  Oral Oncology Clinic will continue to follow.   [facility] Patient [place] Phone [phone] Fax [phone] 11/03/2020 8:47 AM

## 2020-11-03 NOTE — Telephone Encounter (Signed)
Scheduled follow-up appointment per 5/12 los. Patient is aware. 

## 2020-11-06 ENCOUNTER — Other Ambulatory Visit: Payer: Self-pay | Admitting: Hematology

## 2020-11-06 ENCOUNTER — Other Ambulatory Visit (HOSPITAL_COMMUNITY): Payer: Self-pay

## 2020-11-06 MED ORDER — NERATINIB MALEATE 40 MG PO TABS
240.0000 mg | ORAL_TABLET | Freq: Every day | ORAL | 0 refills | Status: DC
  Filled 2020-11-06: qty 147, 24d supply, fill #0
  Filled 2020-11-06: qty 147, 28d supply, fill #0

## 2020-11-06 MED ORDER — BUDESONIDE ER 9 MG PO TB24
ORAL_TABLET | ORAL | 0 refills | Status: DC
  Filled 2020-11-06: qty 30, 30d supply, fill #0

## 2020-11-06 NOTE — Telephone Encounter (Signed)
Oral Chemotherapy Pharmacist Encounter   Attempted to reach patient to provide update and offer for initial counseling on oral medication: Nerlynx (neratinib).   No answer. Left voicemail for patient to call back to discuss details of medication acquisition and initial counseling session.  Leron Croak, PharmD, BCPS Hematology/Oncology Clinical Pharmacist [facility] [phone] 11/06/2020 11:49 AM

## 2020-11-06 NOTE — Telephone Encounter (Addendum)
Oral Chemotherapy Pharmacist Encounter  I spoke with patient for overview of: Nerlynx (neratinib) for the adjuvant treatment of early stage, HER-2 positive breast cancer, planned duration 1 year.  Counseled patient on administration, dosing, side effects, monitoring, drug-food interactions, safe handling, storage, and disposal.  Prescribing information for Nerlynx recommends dose initiation at full dose, 6 tablets (240 mg) once daily, with the use of scheduled antidiarrheal prophylaxis.  Nerlynx will be initiated on a dose titration schedule. Antidiarrheal prophylaxis with budesonide will be used.   Patient will use loperamide as needed if symptoms of diarrhea occur.  Week 1: Patient will take Nerlynx 40m tablets, 4 tablets (1651m by mouth once daily with food Week 2: Patient will take Nerlynx 4085mablets, 5 tablets (200m84my mouth once daily with food Week 3: Patient will take Nerlynx 40mg69mlets, 6 tablets (240mg)64mmouth once daily with food. 240 mg once daily is the target dose of Nerlynx.  Patient knows to avoid grapefruit and grapefruit juice while on treatment with Nerlynx.  Patient instructed to avoid use of PPIs or H2RAs while on treatment with Nerlynx, can separate antacids from Nerlynx by 3 hours if acid suppression is needed.  Nerlynx start date: 11/09/20  Adverse effects include but are not limited to: diarrhea, nausea, vomiting, mouth sores, fatigue, rash, and abdominal pain.    Patient instructed to increase or decrease loperamide dose to maintain 1-2 bowel movements per day.  Reviewed with patient importance of keeping a medication schedule and plan for any missed doses. No barriers to medication adherence identified.  Medication reconciliation performed and medication/allergy list updated.  Insurance authorization for Nerlynx has been obtained. Test claim at the pharmacy revealed copayment $0 for 1st fill of Nerlynx. [place] from the [facility] to deliver to patient's home on 11/08/20.  Patient informed the pharmacy will reach out 5-7 days prior to needing next fill of Nerlynx to coordinate continued medication acquisition to prevent break in therapy.  All questions answered.  Ms. HairstGensd understanding and appreciation.   Medication education handout placed in mail for patient. Patient knows to call the office with questions or concerns. Oral Chemotherapy Clinic phone number provided to patient.   RebeccLeron CroakmD, BCPS Hematology/Oncology Clinical Pharmacist [phone] 12:44 PM

## 2020-11-07 ENCOUNTER — Other Ambulatory Visit (HOSPITAL_COMMUNITY): Payer: Self-pay

## 2020-11-08 ENCOUNTER — Other Ambulatory Visit (HOSPITAL_COMMUNITY): Payer: Self-pay

## 2020-11-08 ENCOUNTER — Other Ambulatory Visit: Payer: Self-pay

## 2020-11-08 DIAGNOSIS — Z170 Estrogen receptor positive status [ER+]: Secondary | ICD-10-CM

## 2020-11-08 MED ORDER — BUDESONIDE ER 9 MG PO TB24
1.0000 | ORAL_TABLET | Freq: Every day | ORAL | 1 refills | Status: DC
  Filled 2020-11-08 (×2): qty 30, 30d supply, fill #0

## 2020-11-13 ENCOUNTER — Other Ambulatory Visit (HOSPITAL_COMMUNITY): Payer: Self-pay

## 2020-11-24 ENCOUNTER — Telehealth: Payer: Self-pay

## 2020-11-24 NOTE — Telephone Encounter (Signed)
Per Dr Burr Medico, Victoria Barker is to take 4 nerlynx daily and not increase dosage.  She can take imodium for diarrhea.  I reviewed with Victoria Shorb and she verbalized understanding.  She will call if symptoms worsen.  She has appt on 6/9.

## 2020-11-29 ENCOUNTER — Other Ambulatory Visit (HOSPITAL_COMMUNITY): Payer: Self-pay

## 2020-11-29 NOTE — Progress Notes (Signed)
[place]   [phone] Fax:([phone]   Clinic Follow up Note   Patient Care Team: [name], MD as PCP - General (Obstetrics and Gynecology) Mauro Kaufmann, RN as Oncology Nurse Navigator Rockwell Germany, RN as Oncology Nurse Navigator Donnie Mesa, MD as Consulting Physician (General Surgery) Truitt Merle, MD as Consulting Physician (Hematology) Eppie Gibson, MD as Attending Physician (Radiation Oncology)  Date of Service:  11/30/2020  CHIEF COMPLAINT: f/u of left breast cancer  SUMMARY OF ONCOLOGIC HISTORY: Oncology History Overview Note  Cancer Staging Malignant neoplasm of upper-outer quadrant of left breast in female, estrogen receptor positive ([place]) Staging form: Breast, AJCC 8th Edition - Clinical stage from 04/28/2019: Stage IIA (cT2, cN0, cM0, G2, ER+, PR-, HER2: Equivocal) - Signed by Truitt Merle, MD on 05/04/2019 - Pathologic stage from 07/13/2019: Stage IIA (pT2, pN0, cM0, G3, ER+, PR-, HER2+, Oncotype DX score: 10) - Signed by Truitt Merle, MD on 08/20/2019    Malignant neoplasm of upper-outer quadrant of left breast in female, estrogen receptor positive ([place])  04/21/2019 Mammogram   Diagnostic mammogram 04/21/19  IMPRESSION 1. findings highly suspicious for left breast cancer. There is a palpable irregular mass 4cm from nipple measuring 2.4x2.4x1.8cm in the 1:00 position axis of the left breast and pleomorphic  calcifications in the upper-outer quadrant as described above.  2. single left 0.9x0.5cm axillary lymph node with a mild/borderline thickened cortex.    04/28/2019 Cancer Staging   Staging form: Breast, AJCC 8th Edition - Clinical stage from 04/28/2019: Stage IIA (cT2, cN0, cM0, G2, ER+, PR-, HER2: Equivocal) - Signed by Truitt Merle, MD on 05/04/2019    04/28/2019 Initial Biopsy   Diagnosis 04/28/19 1. Breast, left, needle core biopsy, 1 o'clock position - INVASIVE DUCTAL CARCINOMA. - DUCTAL CARCINOMA IN SITU. - SEE  COMMENT. 2. Lymph node, needle/core biopsy, left axilla - THERE IS NO EVIDENCE OF CARCINOMA IN 1 OF 1 LYMPH NODE (0/1). 3. Breast, left, needle core biopsy, upper, outer quadrant - DUCTAL CARCINOMA IN SITU WITH CALCIFICATIONS. - SEE COMMENT.   04/28/2019 Receptors her2   1. PROGNOSTIC INDICATORS Results: HER2 negative  Estrogen Receptor: 80%, POSITIVE, STRONG STAINING INTENSITY Progesterone Receptor: 0%, NEGATIVE Proliferation Marker Ki67: 35%    04/30/2019 Initial Diagnosis   Malignant neoplasm of upper-outer quadrant of left breast in female, estrogen receptor positive ([name])     Genetic Testing   No pathogenic variants identified. 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 The final report date is 05/28/2019.  The STAT Breast cancer panel offered by [facility] includes sequencing and rearrangement analysis for the following 9 genes:  ATM, BRCA1, BRCA2, CDH1, CHEK2, PALB2, PTEN, STK11 and TP53.    The Common Hereditary Cancers Panel offered by [facility] includes sequencing and/or deletion duplication testing of the following 48 genes: APC, ATM, AXIN2, BARD1, BMPR1A, BRCA1, BRCA2, BRIP1, CDH1, CDKN2A (p14ARF), CDKN2A (p16INK4a), CKD4, CHEK2, CTNNA1, DICER1, EPCAM (Deletion/duplication testing only), GREM1 (promoter region deletion/duplication testing only), KIT, MEN1, MLH1, MSH2, MSH3, MSH6, MUTYH, NBN, NF1, NHTL1, PALB2, PDGFRA, PMS2, POLD1, POLE, PTEN, RAD50, RAD51C, RAD51D, RNF43, SDHB, SDHC, SDHD, SMAD4, SMARCA4. STK11, TP53, TSC1, TSC2, and VHL.  The following genes were evaluated for sequence changes only: SDHA and HOXB13 c.251G>A variant only.   05/11/2019 Breast MRI   IMPRESSION: 1. Enhancing mass and architectural distortion spanning approximately 6 x 3 x 5.3 cm in the upper outer left breast consistent with the patient's biopsy-proven site  of invasive cancer. 2. Biopsy changes in the anterior left breast at the  site of the patient's biopsy-proven ductal carcinoma in situ. 3. Suspicious changes within the upper-outer quadrant of the left breast spanning a total of 8 cm in the AP dimension. This includes the site of biopsy-proven DCIS anteriorly and suspicious enhancement extending to the level of the pectoralis muscle posteriorly. 4. Suspicious 7 mm enhancing mass in the far posterior slightly lateral left breast (image 138/212). 5. No MRI evidence of malignancy on the right. 6. No suspicious lymphadenopathy.     05/26/2019 -  Anti-estrogen oral therapy   Tamoxifen 25m daily starting 05/26/19. Stopped before surgery and chemo on 07/13/19. Restart in 12/2019. Due to insomnia and nausea, reduced dose to 10 in 05/2020.  ---------Started her on Ovarian suppression Zoladex on 06/15/20.  ----------Switched to Letrozole in 07/2020.    07/13/2019 Surgery   LEFT NIPPLE SPARING MASTECTOMY WITH SENTINEL LYMPH NODE BIOPSY and LEFT BREAST RECONSTRUCTION WITH PLACEMENT OF TISSUE EXPANDER AND ALLODERM by D.r Tsuie and Dr TIran Planas    07/13/2019 Pathology Results   SURGICAL PATHOLOGY   FINAL MICROSCOPIC DIAGNOSIS:   A. LYMPH NODE, LEFT AXILLARY #1, SENTINEL, BIOPSY:  - One of one lymph nodes negative for carcinoma (0/1).   B. BREAST, LEFT, MASTECTOMY:  - Invasive ductal carcinoma, grade 3, spanning 3.4 cm.  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  - Invasive carcinoma is 0.2 cm from the posterior margin focally (not on  ink) and 0.4 cm from the  anterior margin focally (not on ink).  - In situ carcinoma is 0.4 cm from the posterior margin focally (not on  ink).  - Biopsy site x2.  - See oncology table.   C. NIPPLE, LEFT, BIOPSY:  - Benign nipple tissue.   D. LYMPH NODE, LEFT AXILLARY #2, SENTINEL, BIOPSY:  - One of one lymph nodes negative for carcinoma (0/1).     GROUP 1:  HER2 **POSITIVE**    07/13/2019 Oncotype testing   Ocotype score 56 with 39% risk of recurrence with  Tmaoxifen or AI alone. There is a 15% benefit from adjuvant chemo.     07/13/2019 Cancer Staging   Staging form: Breast, AJCC 8th Edition - Pathologic stage from 07/13/2019: Stage IIA (pT2, pN0, cM0, G3, ER+, PR-, HER2+, Oncotype DX score: 56) - Signed by FTruitt Merle MD on 08/20/2019    08/25/2019 Echocardiogram   Baseline Echo  IMPRESSIONS     1. Left ventricular ejection fraction, by estimation, is 65 to 70%. The  left ventricle has normal function. The left ventricle has no regional  wall motion abnormalities. Left ventricular diastolic parameters were  normal.   2. Right ventricular systolic function is normal. The right ventricular  size is normal. There is normal pulmonary artery systolic pressure.   3. Left atrial size was mildly dilated.   4. The mitral valve is normal in structure and function. Trivial mitral  valve regurgitation.   5. The aortic valve is tricuspid. Aortic valve regurgitation is not  visualized.   6. The inferior vena cava is normal in size with greater than 50%  respiratory variability, suggesting right atrial pressure of 3 mmHg.   7. The aortic valve is not well seen. It is trileaflet. There appears to  be some calcification associated with the non-coronary cusp but this is  not well seen.    08/25/2019 Imaging   Whole body Bone Scan  IMPRESSION: No significant abnormality identified.   08/25/2019 Imaging  CT CAP w Contrast  IMPRESSION: 1. No findings of metastatic disease to the chest, abdomen, or pelvis. 2. 0.7 by 0.3 cm sclerotic lesion in the right seventh rib laterally is likely benign but merits surveillance. 3. Mild sclerosis along the sacroiliac joints bilaterally, query mild chronic sacroiliitis.     09/01/2019 Procedure   INSERTION PORT-A-CATH WITH ULTRASOUND GUIDANCE by Dr. Georgette Dover   09/02/2019 -  Chemotherapy   TCH (Taxol, Carboplatin, Herceptin) q3weeks for 6 cycles starting 09/02/19-12/16/19, followed by anti-HER2 maintenance therapy with  Herceptin q3weeks starting 01/06/20 to complete 1 year of treatment (from 08/2019)     05/05/2020 Surgery   REMOVAL OF LEFT CHEST TISSUE EXPANDER AND PLACEMENT OF IMPLANT and RIGHT BREAST MASTOPEXY and LIPOFILLING FROM ABDOMEN TO RIGHT CHEST by Dr Iran Planas   11/09/2020 -  Chemotherapy   Neratinib    Invasive ductal carcinoma of breast, female, left ([place])  07/13/2019 Initial Diagnosis   Invasive ductal carcinoma of breast, female, left ([place])    11/09/2020 -  Chemotherapy   Neratinib       CURRENT THERAPY:  -Tamoxifen 57m daily starting 05/26/19. Stopped before surgery and chemo on 07/13/19. Restart in 12/2019. Due to insomnia and nausea, reduced dose to 10 in 05/2020. Started her on Ovarian suppression Zoladex on 06/15/20. Switched to Letrozole in 07/2020.  -Neratinib started 11/09/20  INTERVAL HISTORY:  KLatanga Nedrowis here for a follow up of breast cancer. She was last seen by me on 11/02/20. She presents to the clinic alone. She reports nausea from the medication, "like butterflies." She reports diarrhea the first few days. She later took imodium, which provided relief, but she continued to have stomach discomfort. She notes this worsened as she increased the dose. She notes she missed a few doses in order to see if it was in fact the medication making her nauseous. She endorses using Ensure to supplement. She notes her 121yo son broke his knee this past Monday, after he had just recovered from a broken wrist.  All other systems were reviewed with the patient and are negative.  MEDICAL HISTORY:  Past Medical History:  Diagnosis Date   [place] [place] [place]   Left Mastectomy   [place]    Left breast Ca   GERD (gastroesophageal reflux disease)    Personal history of chemotherapy 08/2019-11/2019   Scoliosis    Sickle cell trait ([facility]     SURGICAL HISTORY: Past Surgical History:  Procedure Laterality Date   BREAST IMPLANT EXCHANGE Left 09/26/2020   Procedure:  REVISION LEFT BREAST RECONSTRUCTION WITH SILICONE IMPLANT EXCHANGE;  Surgeon: TIrene Limbo MD;  [place] [facility]  Service: Plastics;  Laterality: Left;   BREAST RECONSTRUCTION WITH PLACEMENT OF TISSUE EXPANDER AND ALLODERM Left 07/13/2019   Procedure: LEFT BREAST RECONSTRUCTION WITH PLACEMENT OF TISSUE EXPANDER AND ALLODERM;  Surgeon: TIrene Limbo MD;  [place] [facility]  Service: Plastics;  Laterality: Left;   BREAST REDUCTION SURGERY Right 09/26/2020   Procedure: REVISION RIGHT BREAST REDUCTION;  Surgeon: TIrene Limbo MD;  [place] [facility]  Service: Plastics;  Laterality: Right;   DEBRIDEMENT AND CLOSURE WOUND Left 07/30/2019   Procedure: DEBRIDEMENT LEFT MASTECTOMY FLAP;  Surgeon: TIrene Limbo MD;  [place] [facility]  Service: Plastics;  Laterality: Left;   INDUCED ABORTION  2017   LIPOSUCTION WITH LIPOFILLING Right 05/05/2020   Procedure: LIPOFILLING FROM ABDOMEN TO RIGHT CHEST;  Surgeon: TIrene Limbo MD;  [place] [facility]  Service: Plastics;  Laterality: Right;   MASTECTOMY Left 07/13/2019   MASTOPEXY Right 05/05/2020   Procedure: RIGHT BREAST MASTOPEXY;  Surgeon: Irene Limbo, MD;  Location: [facility];  Service: Plastics;  Laterality: Right;   NIPPLE SPARING MASTECTOMY WITH SENTINEL LYMPH NODE BIOPSY Left 07/13/2019   Procedure: LEFT NIPPLE SPARING MASTECTOMY WITH SENTINEL LYMPH NODE BIOPSY;  Surgeon: Donnie Mesa, MD;  Location: [facility];  Service: General;  Laterality: Left;   PORT-A-CATH REMOVAL Right 09/26/2020   Procedure: REMOVAL RIGHT CHEST PORT;  Surgeon: Irene Limbo, MD;  Location: [facility];  Service: Plastics;  Laterality: Right;   PORTACATH PLACEMENT Right 09/01/2019   Procedure: INSERTION PORT-A-CATH WITH ULTRASOUND GUIDANCE;  Surgeon: Donnie Mesa, MD;  Location: [facility];  Service: General;  Laterality: Right;   REMOVAL  OF TISSUE EXPANDER AND PLACEMENT OF IMPLANT Left 05/05/2020   Procedure: REMOVAL OF LEFT CHEST TISSUE EXPANDER AND PLACEMENT OF IMPLANT;  Surgeon: Irene Limbo, MD;  Location: [facility];  Service: Plastics;  Laterality: Left;   TISSUE EXPANDER PLACEMENT Left 07/30/2019   Procedure: TISSUE EXPANSION LEFT CHEST;  Surgeon: Irene Limbo, MD;  Location: [facility];  Service: Plastics;  Laterality: Left;    I have reviewed the social history and family history with the patient and they are unchanged from previous note.  ALLERGIES:  is allergic to bactrim [sulfamethoxazole-trimethoprim].  MEDICATIONS:  Current Outpatient Medications  Medication Sig Dispense Refill   lidocaine-prilocaine (EMLA) cream Apply 1 application topically as needed. 30 g 0   Budesonide ER 9 MG TB24 Take 1 tablet by mouth daily. 30 tablet 1   Lactobacillus-Inulin (CULTURELLE DIGESTIVE HEALTH PO) Take 1 capsule by mouth daily as needed (digestive health.).      letrozole (FEMARA) 2.5 MG tablet Take 1 tablet (2.5 mg total) by mouth daily. 30 tablet 3   Neratinib Maleate (NERLYNX) 40 MG tablet Take 6 tablets (240 mg total) by mouth daily. Start at 4 tabs daily for first week, if tolerates well, then increase to 5 tabs daily for second week and 6 tabs daily on third week then continue. Take with food. 147 tablet 0   venlafaxine XR (EFFEXOR-XR) 75 MG 24 hr capsule Take 1 capsule (75 mg total) by mouth daily with breakfast. 30 capsule 3   No current facility-administered medications for this visit.    PHYSICAL EXAMINATION: ECOG PERFORMANCE STATUS: 1 - Symptomatic but completely ambulatory  Vitals:   11/30/20 0914  BP: 117/82  Pulse: 97  Resp: 18  Temp: 98 F (36.7 C)  SpO2: 99%   Filed Weights   11/30/20 0914  Weight: 130 lb 11.2 oz (59.3 kg)    Due to COVID19 we will limit examination to appearance. Patient had no complaints.  GENERAL:alert, no distress and comfortable SKIN:  skin color normal, no rashes or significant lesions EYES: normal, Conjunctiva are pink and non-injected, sclera clear  NEURO: alert & oriented x 3 with fluent speech  LABORATORY DATA:  I have reviewed the data as listed CBC Latest Ref Rng & Units 11/30/2020 11/02/2020 08/24/2020  WBC 4.0 - 10.5 K/uL 6.6 6.5 5.3  Hemoglobin 12.0 - 15.0 g/dL 13.2 12.9 13.0  Hematocrit 36.0 - 46.0 % 39.6 38.8 38.2  Platelets 150 - 400 K/uL 227 237 205     CMP Latest Ref Rng & Units 11/30/2020 11/02/2020 08/24/2020  Glucose 70 - 99 mg/dL 100(H) 112(H) 122(H)  BUN 6 - 20 mg/dL 16 15 10   Creatinine 0.44 -  1.00 mg/dL 0.81 0.80 0.81  Sodium 135 - 145 mmol/L 143 141 139  Potassium 3.5 - 5.1 mmol/L 4.0 3.9 3.8  Chloride 98 - 111 mmol/L 106 103 106  CO2 22 - 32 mmol/L 27 28 25   Calcium 8.9 - 10.3 mg/dL 9.3 9.5 9.2  Total Protein 6.5 - 8.1 g/dL 6.7 7.1 6.9  Total Bilirubin 0.3 - 1.2 mg/dL 0.3 0.3 0.3  Alkaline Phos 38 - 126 U/L 74 85 69  AST 15 - 41 U/L 17 17 19   ALT 0 - 44 U/L 12 10 17       RADIOGRAPHIC STUDIES: I have personally reviewed the radiological images as listed and agreed with the findings in the report. No results found.   ASSESSMENT & PLAN:  Victoria Barker is a 40 y.o. female with   1. Malignant neoplasm of upper-outer quadrant of left breast, invasive ductal carcinoma and DCIS, stage IIA, pT2N0M0, ER+/PR-/HER2+, Grade III -She was diagnosed in 04/2019. She was found to have 2 left breast masses with DCIS and 1 mass with Invasive ductal carcinoma, her abnormal lymph node was biopsied and found to be negative. -On 07/13/19 she underwent left breast mastectomy with Dr. Prince Solian and reconstruction with Dr. Iran Planas. Tumor was found to be HER2 positive on resected sample and Oncotype testing (ordered before HER2 came back positive) with recurrence score of 56.  -To reduce her high risk of recurrence she completed 6 cycles of adjuvant chemotherapy with TCH (09/02/19-12/16/19). She completed one year therapy  in 08/2020 -Given node negative disease and mastectomy, Dr Isidore Moos did not recommend adjuvant radiation.  -She restarted Tamoxifen in 12/2019. Due to insomnia and intermittent nausea she can reduce to 27m but could not tolerate and stopped in early Jan 2022. I switched her to Letrozole in 07/2020, she is tolerating better, hot flashes manageable. -She started ovarian suppression Zoladex injections on 06/15/20, will continue every 4 weeks until her BSO.  -She began neratinib on 11/09/20.  She has had mild intermittent nausea, and epigastric discomfort on low-dose 1656mdaily -She cannot take a PPI when she is on neratinib, I recommend her to use Pepcid (2 hours after or 10 hours before neratinib) twice daily, and Zofran 2-3 times a day.  If symptom improves, she is willing to try increased dose to 200 mg and 25080mn the next 2 weeks. -She is scheduled for follow up with Dr. ThiIran Planaster this month. -We will check in with her in 1 month.  2. Nausea, diarrhea -secondary to the neratinib -She is using imodium to control the diarrhea. -for nausea, I recommended she take Pepcid, then try compazine or zofran.   3. Hot Flashes -She has had worsened hot flashes day and night which are effecting her sleep.  -I started her on Effexor in 12/2019. Her hot flashes have improved but are still present. Dose increased to 75 mg on 11/02/20   4. Genetic testing negative for pathogenetic mutations.      PLAN:  -Continue letrozole -Continue neratinib at 160m11mily,add pepcid and antiemetics, if her symptom improves, she will try 200mg67mly next week, and 240mg 63my in 2 weeks -proceed with Zoladex injection today and every 4 weeks -Lab and follow-up in 4 weeks     No problem-specific Assessment & Plan notes found for this encounter.   No orders of the defined types were placed in this encounter.  All questions were answered. The patient knows to call the clinic with any problems, questions or concerns.  No  barriers to learning was detected. The total time spent in the appointment was 30 minutes.     Truitt Merle, MD 11/30/2020   I, Wilburn Mylar, am acting as scribe for Truitt Merle, MD.   I have reviewed the above documentation for accuracy and completeness, and I agree with the above.

## 2020-11-30 ENCOUNTER — Other Ambulatory Visit: Payer: BC Managed Care – PPO

## 2020-11-30 ENCOUNTER — Inpatient Hospital Stay: Payer: BC Managed Care – PPO | Attending: Hematology

## 2020-11-30 ENCOUNTER — Inpatient Hospital Stay (HOSPITAL_BASED_OUTPATIENT_CLINIC_OR_DEPARTMENT_OTHER): Payer: BC Managed Care – PPO | Admitting: Hematology

## 2020-11-30 ENCOUNTER — Inpatient Hospital Stay: Payer: BC Managed Care – PPO

## 2020-11-30 ENCOUNTER — Ambulatory Visit: Payer: BC Managed Care – PPO | Admitting: Hematology

## 2020-11-30 ENCOUNTER — Other Ambulatory Visit: Payer: Self-pay

## 2020-11-30 ENCOUNTER — Encounter: Payer: Self-pay | Admitting: Hematology

## 2020-11-30 ENCOUNTER — Ambulatory Visit: Payer: BC Managed Care – PPO

## 2020-11-30 ENCOUNTER — Other Ambulatory Visit (HOSPITAL_COMMUNITY): Payer: Self-pay

## 2020-11-30 ENCOUNTER — Telehealth: Payer: Self-pay | Admitting: Hematology

## 2020-11-30 VITALS — BP 117/82 | HR 97 | Temp 98.0°F | Resp 18 | Ht 62.0 in | Wt 130.7 lb

## 2020-11-30 DIAGNOSIS — C50412 Malignant neoplasm of upper-outer quadrant of left female breast: Secondary | ICD-10-CM | POA: Diagnosis present

## 2020-11-30 DIAGNOSIS — Z170 Estrogen receptor positive status [ER+]: Secondary | ICD-10-CM

## 2020-11-30 DIAGNOSIS — Z5111 Encounter for antineoplastic chemotherapy: Secondary | ICD-10-CM | POA: Diagnosis present

## 2020-11-30 DIAGNOSIS — Z95828 Presence of other vascular implants and grafts: Secondary | ICD-10-CM

## 2020-11-30 LAB — CBC WITH DIFFERENTIAL (CANCER CENTER ONLY)
Abs Immature Granulocytes: 0 10*3/uL (ref 0.00–0.07)
Basophils Absolute: 0.1 10*3/uL (ref 0.0–0.1)
Basophils Relative: 1 %
Eosinophils Absolute: 0.3 10*3/uL (ref 0.0–0.5)
Eosinophils Relative: 5 %
HCT: 39.6 % (ref 36.0–46.0)
Hemoglobin: 13.2 g/dL (ref 12.0–15.0)
Immature Granulocytes: 0 %
Lymphocytes Relative: 45 %
Lymphs Abs: 3 10*3/uL (ref 0.7–4.0)
MCH: 29.5 pg (ref 26.0–34.0)
MCHC: 33.3 g/dL (ref 30.0–36.0)
MCV: 88.4 fL (ref 80.0–100.0)
Monocytes Absolute: 0.4 10*3/uL (ref 0.1–1.0)
Monocytes Relative: 5 %
Neutro Abs: 2.9 10*3/uL (ref 1.7–7.7)
Neutrophils Relative %: 44 %
Platelet Count: 227 10*3/uL (ref 150–400)
RBC: 4.48 MIL/uL (ref 3.87–5.11)
RDW: 12.9 % (ref 11.5–15.5)
WBC Count: 6.6 10*3/uL (ref 4.0–10.5)
nRBC: 0 % (ref 0.0–0.2)

## 2020-11-30 LAB — CMP (CANCER CENTER ONLY)
ALT: 12 U/L (ref 0–44)
AST: 17 U/L (ref 15–41)
Albumin: 3.7 g/dL (ref 3.5–5.0)
Alkaline Phosphatase: 74 U/L (ref 38–126)
Anion gap: 10 (ref 5–15)
BUN: 16 mg/dL (ref 6–20)
CO2: 27 mmol/L (ref 22–32)
Calcium: 9.3 mg/dL (ref 8.9–10.3)
Chloride: 106 mmol/L (ref 98–111)
Creatinine: 0.81 mg/dL (ref 0.44–1.00)
GFR, Estimated: >60 mL/min (ref >60)
Glucose, Bld: 100 mg/dL — ABNORMAL HIGH (ref 70–99)
Potassium: 4 mmol/L (ref 3.5–5.1)
Sodium: 143 mmol/L (ref 135–145)
Total Bilirubin: 0.3 mg/dL (ref 0.3–1.2)
Total Protein: 6.7 g/dL (ref 6.5–8.1)

## 2020-11-30 MED ORDER — GOSERELIN ACETATE 3.6 MG ~~LOC~~ IMPL
DRUG_IMPLANT | SUBCUTANEOUS | Status: AC
  Filled 2020-11-30: qty 3.6

## 2020-11-30 MED ORDER — GOSERELIN ACETATE 3.6 MG ~~LOC~~ IMPL
3.6000 mg | DRUG_IMPLANT | Freq: Once | SUBCUTANEOUS | Status: AC
  Administered 2020-11-30: 3.6 mg via SUBCUTANEOUS

## 2020-11-30 MED ORDER — LIDOCAINE-PRILOCAINE 2.5-2.5 % EX CREA: 1.0000 "application " | TOPICAL_CREAM | CUTANEOUS | 0 refills | Status: DC | PRN

## 2020-11-30 NOTE — Telephone Encounter (Signed)
Scheduled per los. Declined printout  

## 2020-12-01 ENCOUNTER — Other Ambulatory Visit: Payer: Self-pay | Admitting: Hematology

## 2020-12-06 ENCOUNTER — Other Ambulatory Visit (HOSPITAL_COMMUNITY): Payer: Self-pay

## 2020-12-06 ENCOUNTER — Other Ambulatory Visit: Payer: Self-pay | Admitting: Hematology

## 2020-12-06 DIAGNOSIS — C50412 Malignant neoplasm of upper-outer quadrant of left female breast: Secondary | ICD-10-CM

## 2020-12-07 ENCOUNTER — Other Ambulatory Visit (HOSPITAL_COMMUNITY): Payer: Self-pay

## 2020-12-08 ENCOUNTER — Other Ambulatory Visit (HOSPITAL_COMMUNITY): Payer: Self-pay

## 2020-12-08 ENCOUNTER — Other Ambulatory Visit: Payer: Self-pay | Admitting: Hematology

## 2020-12-08 DIAGNOSIS — Z170 Estrogen receptor positive status [ER+]: Secondary | ICD-10-CM

## 2020-12-11 ENCOUNTER — Other Ambulatory Visit: Payer: Self-pay | Admitting: Hematology

## 2020-12-11 ENCOUNTER — Telehealth: Payer: Self-pay

## 2020-12-11 ENCOUNTER — Other Ambulatory Visit (HOSPITAL_COMMUNITY): Payer: Self-pay

## 2020-12-11 DIAGNOSIS — C50412 Malignant neoplasm of upper-outer quadrant of left female breast: Secondary | ICD-10-CM

## 2020-12-11 DIAGNOSIS — Z170 Estrogen receptor positive status [ER+]: Secondary | ICD-10-CM

## 2020-12-11 NOTE — Telephone Encounter (Signed)
I have called Victoria Barker multiple times to find out how many Nerlynx tablets she is taking daily.  She has not answered and I am unable to leave a message as her mailbox is full.

## 2020-12-13 ENCOUNTER — Other Ambulatory Visit (HOSPITAL_COMMUNITY): Payer: Self-pay

## 2020-12-13 ENCOUNTER — Telehealth: Payer: Self-pay

## 2020-12-13 NOTE — Telephone Encounter (Signed)
I have confirmed with Maddison she is taking 4 Nerlynx tablets per day.

## 2020-12-14 ENCOUNTER — Other Ambulatory Visit (HOSPITAL_COMMUNITY): Payer: Self-pay

## 2020-12-15 ENCOUNTER — Other Ambulatory Visit (HOSPITAL_COMMUNITY): Payer: Self-pay

## 2020-12-15 ENCOUNTER — Other Ambulatory Visit: Payer: Self-pay

## 2020-12-15 DIAGNOSIS — Z170 Estrogen receptor positive status [ER+]: Secondary | ICD-10-CM

## 2020-12-15 DIAGNOSIS — C50412 Malignant neoplasm of upper-outer quadrant of left female breast: Secondary | ICD-10-CM

## 2020-12-15 MED ORDER — NERATINIB MALEATE 40 MG PO TABS
160.0000 mg | ORAL_TABLET | Freq: Every day | ORAL | 3 refills | Status: DC
  Filled 2020-12-15: qty 120, 30d supply, fill #0

## 2020-12-18 ENCOUNTER — Other Ambulatory Visit (HOSPITAL_COMMUNITY): Payer: Self-pay

## 2020-12-19 ENCOUNTER — Telehealth: Payer: Self-pay

## 2020-12-19 NOTE — Telephone Encounter (Signed)
This nurse received message from this patient stating that she is unable to take her Nerlynx.  She states she has had very bad nause for the last week and she has been vomiting all last night.  Patient is requesting to know if there is something else that she can take.  This information will be forwarded to the MD.  Will follow up with patient.

## 2020-12-20 ENCOUNTER — Telehealth: Payer: Self-pay

## 2020-12-20 NOTE — Telephone Encounter (Signed)
Called and spoke with patient regarding her concerns about taking Nerlynx. Patient stated that it made her extremely nauseous and that she had been vomiting often. Informed her that, per Dr Burr Medico, it is ok to stop Nerlynx. Will follow up with labs and MD on 7/7. Patient verbalized understanding.

## 2020-12-28 ENCOUNTER — Inpatient Hospital Stay: Payer: BC Managed Care – PPO

## 2020-12-28 ENCOUNTER — Inpatient Hospital Stay: Payer: BC Managed Care – PPO | Attending: Hematology | Admitting: Hematology

## 2020-12-28 ENCOUNTER — Other Ambulatory Visit: Payer: Self-pay

## 2020-12-28 VITALS — BP 115/77 | HR 84 | Temp 97.5°F | Resp 18 | Ht 62.0 in | Wt 131.2 lb

## 2020-12-28 DIAGNOSIS — Z79899 Other long term (current) drug therapy: Secondary | ICD-10-CM | POA: Insufficient documentation

## 2020-12-28 DIAGNOSIS — Z1231 Encounter for screening mammogram for malignant neoplasm of breast: Secondary | ICD-10-CM

## 2020-12-28 DIAGNOSIS — Z170 Estrogen receptor positive status [ER+]: Secondary | ICD-10-CM

## 2020-12-28 DIAGNOSIS — C50412 Malignant neoplasm of upper-outer quadrant of left female breast: Secondary | ICD-10-CM

## 2020-12-28 DIAGNOSIS — Z5111 Encounter for antineoplastic chemotherapy: Secondary | ICD-10-CM | POA: Diagnosis present

## 2020-12-28 DIAGNOSIS — Z95828 Presence of other vascular implants and grafts: Secondary | ICD-10-CM

## 2020-12-28 LAB — CBC WITH DIFFERENTIAL (CANCER CENTER ONLY)
Abs Immature Granulocytes: 0.01 10*3/uL (ref 0.00–0.07)
Basophils Absolute: 0.1 10*3/uL (ref 0.0–0.1)
Basophils Relative: 1 %
Eosinophils Absolute: 0.1 10*3/uL (ref 0.0–0.5)
Eosinophils Relative: 2 %
HCT: 37.6 % (ref 36.0–46.0)
Hemoglobin: 12.8 g/dL (ref 12.0–15.0)
Immature Granulocytes: 0 %
Lymphocytes Relative: 47 %
Lymphs Abs: 2.5 10*3/uL (ref 0.7–4.0)
MCH: 29.8 pg (ref 26.0–34.0)
MCHC: 34 g/dL (ref 30.0–36.0)
MCV: 87.4 fL (ref 80.0–100.0)
Monocytes Absolute: 0.3 10*3/uL (ref 0.1–1.0)
Monocytes Relative: 5 %
Neutro Abs: 2.5 10*3/uL (ref 1.7–7.7)
Neutrophils Relative %: 45 %
Platelet Count: 220 10*3/uL (ref 150–400)
RBC: 4.3 MIL/uL (ref 3.87–5.11)
RDW: 12.8 % (ref 11.5–15.5)
WBC Count: 5.4 10*3/uL (ref 4.0–10.5)
nRBC: 0 % (ref 0.0–0.2)

## 2020-12-28 LAB — CMP (CANCER CENTER ONLY)
ALT: 8 U/L (ref 0–44)
AST: 14 U/L — ABNORMAL LOW (ref 15–41)
Albumin: 3.6 g/dL (ref 3.5–5.0)
Alkaline Phosphatase: 78 U/L (ref 38–126)
Anion gap: 8 (ref 5–15)
BUN: 17 mg/dL (ref 6–20)
CO2: 28 mmol/L (ref 22–32)
Calcium: 9.1 mg/dL (ref 8.9–10.3)
Chloride: 106 mmol/L (ref 98–111)
Creatinine: 0.81 mg/dL (ref 0.44–1.00)
GFR, Estimated: >60 mL/min (ref >60)
Glucose, Bld: 102 mg/dL — ABNORMAL HIGH (ref 70–99)
Potassium: 3.9 mmol/L (ref 3.5–5.1)
Sodium: 142 mmol/L (ref 135–145)
Total Bilirubin: 0.2 mg/dL — ABNORMAL LOW (ref 0.3–1.2)
Total Protein: 6.7 g/dL (ref 6.5–8.1)

## 2020-12-28 MED ORDER — GOSERELIN ACETATE 3.6 MG ~~LOC~~ IMPL
3.6000 mg | DRUG_IMPLANT | Freq: Once | SUBCUTANEOUS | Status: AC
Start: 2020-12-28 — End: 2020-12-28
  Administered 2020-12-28: 3.6 mg via SUBCUTANEOUS

## 2020-12-28 NOTE — Progress Notes (Signed)
Victoria Barker   [phone] [phone]   Clinic Follow up Note    Patient Care Team: [name], MD as PCP - General (Obstetrics and Gynecology) Mauro Kaufmann, RN as Oncology Nurse Navigator Rockwell Germany, RN as Oncology Nurse Navigator Donnie Mesa, MD as Consulting Physician (General Surgery) Truitt Merle, MD as Consulting Physician (Hematology) Eppie Gibson, MD as Attending Physician (Radiation Oncology)  Date of Service:  12/28/2020  CHIEF COMPLAINT: f/u of left breast cancer  SUMMARY OF ONCOLOGIC HISTORY: Oncology History Overview Note  Cancer Staging Malignant neoplasm of upper-outer quadrant of left breast in female, estrogen receptor positive ([place]) Staging form: Breast, AJCC 8th Edition - Clinical stage from 04/28/2019: Stage IIA (cT2, cN0, cM0, G2, ER+, PR-, HER2: Equivocal) - Signed by Truitt Merle, MD on 05/04/2019 - Pathologic stage from 07/13/2019: Stage IIA (pT2, pN0, cM0, G3, ER+, PR-, HER2+, Oncotype DX score: 23) - Signed by Truitt Merle, MD on 08/20/2019    Malignant neoplasm of upper-outer quadrant of left breast in female, estrogen receptor positive ([place])  04/21/2019 Mammogram   Diagnostic mammogram 04/21/19  IMPRESSION 1. findings highly suspicious for left breast cancer. There is a palpable irregular mass 4cm from nipple measuring 2.4x2.4x1.8cm in the 1:00 position axis of the left breast and pleomorphic  calcifications in the upper-outer quadrant as described above.  2. single left 0.9x0.5cm axillary lymph node with a mild/borderline thickened cortex.    04/28/2019 Cancer Staging   Staging form: Breast, AJCC 8th Edition - Clinical stage from 04/28/2019: Stage IIA (cT2, cN0, cM0, G2, ER+, PR-, HER2: Equivocal) - Signed by Truitt Merle, MD on 05/04/2019    04/28/2019 Initial Biopsy   Diagnosis 04/28/19 1. Breast, left, needle core biopsy, 1 o'clock position - INVASIVE DUCTAL CARCINOMA. - DUCTAL CARCINOMA IN SITU. - SEE  COMMENT. 2. Lymph node, needle/core biopsy, left axilla - THERE IS NO EVIDENCE OF CARCINOMA IN 1 OF 1 LYMPH NODE (0/1). 3. Breast, left, needle core biopsy, upper, outer quadrant - DUCTAL CARCINOMA IN SITU WITH CALCIFICATIONS. - SEE COMMENT.   04/28/2019 Receptors her2   1. PROGNOSTIC INDICATORS Results: HER2 negative  Estrogen Receptor: 80%, POSITIVE, STRONG STAINING INTENSITY Progesterone Receptor: 0%, NEGATIVE Proliferation Marker Ki67: 35%    04/30/2019 Initial Diagnosis   Malignant neoplasm of upper-outer quadrant of left breast in female, estrogen receptor positive ([name])     Genetic Testing   No pathogenic variants identified. 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 The final report date is 05/28/2019.  The STAT Breast cancer panel offered by [facility] includes sequencing and rearrangement analysis for the following 9 genes:  ATM, BRCA1, BRCA2, CDH1, CHEK2, PALB2, PTEN, STK11 and TP53.    The Common Hereditary Cancers Panel offered by [facility] includes sequencing and/or deletion duplication testing of the following 48 genes: APC, ATM, AXIN2, BARD1, BMPR1A, BRCA1, BRCA2, BRIP1, CDH1, CDKN2A (p14ARF), CDKN2A (p16INK4a), CKD4, CHEK2, CTNNA1, DICER1, EPCAM (Deletion/duplication testing only), GREM1 (promoter region deletion/duplication testing only), KIT, MEN1, MLH1, MSH2, MSH3, MSH6, MUTYH, NBN, NF1, NHTL1, PALB2, PDGFRA, PMS2, POLD1, POLE, PTEN, RAD50, RAD51C, RAD51D, RNF43, SDHB, SDHC, SDHD, SMAD4, SMARCA4. STK11, TP53, TSC1, TSC2, and VHL.  The following genes were evaluated for sequence changes only: SDHA and HOXB13 c.251G>A variant only.   05/11/2019 Breast MRI   IMPRESSION: 1. Enhancing mass and architectural distortion spanning approximately 6 x 3 x 5.3 cm in the upper outer left breast consistent with the patient's biopsy-proven  site of invasive cancer. 2. Biopsy changes in the anterior left breast at the  site of the patient's biopsy-proven ductal carcinoma in situ. 3. Suspicious changes within the upper-outer quadrant of the left breast spanning a total of 8 cm in the AP dimension. This includes the site of biopsy-proven DCIS anteriorly and suspicious enhancement extending to the level of the pectoralis muscle posteriorly. 4. Suspicious 7 mm enhancing mass in the far posterior slightly lateral left breast (image 138/212). 5. No MRI evidence of malignancy on the right. 6. No suspicious lymphadenopathy.     05/26/2019 -  Anti-estrogen oral therapy   Tamoxifen 52m daily starting 05/26/19. Stopped before surgery and chemo on 07/13/19. Restart in 12/2019. Due to insomnia and nausea, reduced dose to 10 in 05/2020.  ---------Started her on Ovarian suppression Zoladex on 06/15/20.  ----------Switched to Letrozole in 07/2020.    07/13/2019 Surgery   LEFT NIPPLE SPARING MASTECTOMY WITH SENTINEL LYMPH NODE BIOPSY and LEFT BREAST RECONSTRUCTION WITH PLACEMENT OF TISSUE EXPANDER AND ALLODERM by D.r Tsuie and Dr TIran Planas    07/13/2019 Pathology Results   SURGICAL PATHOLOGY   FINAL MICROSCOPIC DIAGNOSIS:   A. LYMPH NODE, LEFT AXILLARY #1, SENTINEL, BIOPSY:  - One of one lymph nodes negative for carcinoma (0/1).   B. BREAST, LEFT, MASTECTOMY:  - Invasive ductal carcinoma, grade 3, spanning 3.4 cm.  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  - Invasive carcinoma is 0.2 cm from the posterior margin focally (not on  ink) and 0.4 cm from the  anterior margin focally (not on ink).  - In situ carcinoma is 0.4 cm from the posterior margin focally (not on  ink).  - Biopsy site x2.  - See oncology table.   C. NIPPLE, LEFT, BIOPSY:  - Benign nipple tissue.   D. LYMPH NODE, LEFT AXILLARY #2, SENTINEL, BIOPSY:  - One of one lymph nodes negative for carcinoma (0/1).     GROUP 1:  HER2 **POSITIVE**    07/13/2019 Oncotype testing   Ocotype score 56 with 39% risk of recurrence with  Tmaoxifen or AI alone. There is a 15% benefit from adjuvant chemo.     07/13/2019 Cancer Staging   Staging form: Breast, AJCC 8th Edition - Pathologic stage from 07/13/2019: Stage IIA (pT2, pN0, cM0, G3, ER+, PR-, HER2+, Oncotype DX score: 56) - Signed by FTruitt Merle MD on 08/20/2019    08/25/2019 Echocardiogram   Baseline Echo  IMPRESSIONS     1. Left ventricular ejection fraction, by estimation, is 65 to 70%. The  left ventricle has normal function. The left ventricle has no regional  wall motion abnormalities. Left ventricular diastolic parameters were  normal.   2. Right ventricular systolic function is normal. The right ventricular  size is normal. There is normal pulmonary artery systolic pressure.   3. Left atrial size was mildly dilated.   4. The mitral valve is normal in structure and function. Trivial mitral  valve regurgitation.   5. The aortic valve is tricuspid. Aortic valve regurgitation is not  visualized.   6. The inferior vena cava is normal in size with greater than 50%  respiratory variability, suggesting right atrial pressure of 3 mmHg.   7. The aortic valve is not well seen. It is trileaflet. There appears to  be some calcification associated with the non-coronary cusp but this is  not well seen.    08/25/2019 Imaging   Whole body Bone Scan  IMPRESSION: No significant abnormality identified.   08/25/2019 Imaging  CT CAP w Contrast  IMPRESSION: 1. No findings of metastatic disease to the chest, abdomen, or pelvis. 2. 0.7 by 0.3 cm sclerotic lesion in the right seventh rib laterally is likely benign but merits surveillance. 3. Mild sclerosis along the sacroiliac joints bilaterally, query mild chronic sacroiliitis.     09/01/2019 Procedure   INSERTION PORT-A-CATH WITH ULTRASOUND GUIDANCE by Dr. Georgette Dover   09/02/2019 -  Chemotherapy   TCH (Taxol, Carboplatin, Herceptin) q3weeks for 6 cycles starting 09/02/19-12/16/19, followed by anti-HER2 maintenance therapy with  Herceptin q3weeks starting 01/06/20 to complete 1 year of treatment (from 08/2019)     05/05/2020 Surgery   REMOVAL OF LEFT CHEST TISSUE EXPANDER AND PLACEMENT OF IMPLANT and RIGHT BREAST MASTOPEXY and LIPOFILLING FROM ABDOMEN TO RIGHT CHEST by Dr Iran Planas   11/09/2020 - 12/19/2020 Chemotherapy   Neratinib, stopped due to severe nausea/vomiting    Invasive ductal carcinoma of breast, female, left ([place])  07/13/2019 Initial Diagnosis   Invasive ductal carcinoma of breast, female, left ([place])    11/09/2020 - 12/19/2020 Chemotherapy   Neratinib, stopped due to severe nausea/vomiting       CURRENT THERAPY:  -Tamoxifen 11m daily starting 05/26/19. Stopped before surgery and chemo on 07/13/19. Restart in 12/2019. Due to insomnia and nausea, reduced dose to 10 in 05/2020. Switched to Letrozole in 07/2020. -Started her on Ovarian suppression Zoladex on 06/15/20.    INTERVAL HISTORY:  KRyleah Miramontesis here for a follow up of breast cancer. She was last seen by me on 11/30/20. She presents to the clinic alone. She reports the neratinib gave her diarrhea after cycle 1 and nausea with vomiting after cycle 2. Her nausea has now resolved, approximately 3 days after stopping the medicine. She had also stopped her letrozole, as advised when she called about the nausea. She notes the last Zoladex she received left a bad bruise on her stomach.   All other systems were reviewed with the patient and are negative.  MEDICAL HISTORY:  Past Medical History:  Diagnosis Date   Breast cancer ([facility] 2020   Left Mastectomy   Cancer ([facility] [place]    Left breast Ca   GERD (gastroesophageal reflux disease)    Personal history of chemotherapy 08/2019-11/2019   Scoliosis    [place] cell [place]     SURGICAL HISTORY: Past Surgical History:  Procedure Laterality Date   BREAST IMPLANT EXCHANGE Left 09/26/2020   Procedure: REVISION LEFT BREAST RECONSTRUCTION WITH SILICONE IMPLANT EXCHANGE;  Surgeon: TIrene Limbo  MD;  [facility]  Service: Plastics;  Laterality: Left;   BREAST RECONSTRUCTION WITH PLACEMENT OF TISSUE EXPANDER AND ALLODERM Left 07/13/2019   Procedure: LEFT BREAST RECONSTRUCTION WITH PLACEMENT OF TISSUE EXPANDER AND ALLODERM;  Surgeon: TIrene Limbo MD;  [place] [facility]  Service: Plastics;  Laterality: Left;   BREAST REDUCTION SURGERY Right 09/26/2020   Procedure: REVISION RIGHT BREAST REDUCTION;  Surgeon: TIrene Limbo MD;  [place] [facility]  Service: Plastics;  Laterality: Right;   DEBRIDEMENT AND CLOSURE WOUND Left 07/30/2019   Procedure: DEBRIDEMENT LEFT MASTECTOMY FLAP;  Surgeon: TIrene Limbo MD;  [place] [facility]  Service: Plastics;  Laterality: Left;   INDUCED ABORTION  2017   LIPOSUCTION WITH LIPOFILLING Right 05/05/2020   Procedure: LIPOFILLING FROM ABDOMEN TO RIGHT CHEST;  Surgeon: TIrene Limbo MD;  [facility]  Service: Plastics;  Laterality: Right;   MASTECTOMY Left 07/13/2019   MASTOPEXY Right 05/05/2020   Procedure: RIGHT BREAST MASTOPEXY;  Surgeon: Irene Limbo, MD;  Location: [place];  Service: Plastics;  Laterality: Right;   NIPPLE SPARING MASTECTOMY WITH SENTINEL LYMPH NODE BIOPSY Left 07/13/2019   Procedure: LEFT NIPPLE SPARING MASTECTOMY WITH SENTINEL LYMPH NODE BIOPSY;  Surgeon: Donnie Mesa, MD;  Location: [place];  Service: General;  Laterality: Left;   PORT-A-CATH REMOVAL Right 09/26/2020   Procedure: REMOVAL RIGHT CHEST PORT;  Surgeon: Irene Limbo, MD;  Location: [place] [place];  Service: Plastics;  Laterality: Right;   PORTACATH PLACEMENT Right 09/01/2019   Procedure: INSERTION PORT-A-CATH WITH ULTRASOUND GUIDANCE;  Surgeon: Donnie Mesa, MD;  Location: [place];  Service: General;  Laterality: Right;   REMOVAL OF TISSUE EXPANDER AND PLACEMENT OF IMPLANT Left 05/05/2020   Procedure: REMOVAL OF LEFT CHEST  TISSUE EXPANDER AND PLACEMENT OF IMPLANT;  Surgeon: Irene Limbo, MD;  Location: [place];  Service: Plastics;  Laterality: Left;   TISSUE EXPANDER PLACEMENT Left 07/30/2019   Procedure: TISSUE EXPANSION LEFT CHEST;  Surgeon: Irene Limbo, MD;  Location: [place];  Service: Plastics;  Laterality: Left;    I have reviewed the social history and family history with the patient and they are unchanged from previous note.  ALLERGIES:  is allergic to bactrim [sulfamethoxazole-trimethoprim].  MEDICATIONS:  Current Outpatient Medications  Medication Sig Dispense Refill   Lactobacillus-Inulin (CULTURELLE DIGESTIVE HEALTH PO) Take 1 capsule by mouth daily as needed (digestive health.).      letrozole (FEMARA) 2.5 MG tablet Take 1 tablet (2.5 mg total) by mouth daily. 30 tablet 3   lidocaine-prilocaine (EMLA) cream Apply 1 application topically as needed. 30 g 0   venlafaxine XR (EFFEXOR-XR) 75 MG 24 hr capsule TAKE 1 CAPSULE BY MOUTH DAILY WITH BREAKFAST. 90 capsule 2   No current facility-administered medications for this visit.    PHYSICAL EXAMINATION: ECOG PERFORMANCE STATUS: 0 - Asymptomatic  Vitals:   12/28/20 0922  BP: 115/77  Pulse: 84  Resp: 18  Temp: (!) 97.5 F (36.4 C)  SpO2: 100%   Filed Weights   12/28/20 0922  Weight: 131 lb 3.2 oz (59.5 kg)    Due to COVID19 we will limit examination to appearance. Patient had no complaints.  GENERAL:alert, no distress and comfortable SKIN: skin color normal, no rashes or significant lesions EYES: normal, Conjunctiva are pink and non-injected, sclera clear  NEURO: alert & oriented x 3 with fluent speech  LABORATORY DATA:  I have reviewed the data as listed CBC Latest Ref Rng & Units 12/28/2020 11/30/2020 11/02/2020  WBC 4.0 - 10.5 K/uL 5.4 6.6 6.5  Hemoglobin 12.0 - 15.0 g/dL 12.8 13.2 12.9  Hematocrit 36.0 - 46.0 % 37.6 39.6 38.8  Platelets 150 - 400 K/uL 220 227 237     CMP Latest Ref  Rng & Units 12/28/2020 11/30/2020 11/02/2020  Glucose 70 - 99 mg/dL 102(H) 100(H) 112(H)  BUN 6 - 20 mg/dL _0 Creatinine 0.44 - 1.00 mg/dL 0.81 0.81 0.80  Sodium 135 - 145 mmol/L 142 143 141  Potassium 3.5 - 5.1 mmol/L 3.9 4.0 3.9  Chloride 98 - 111 mmol/L 106 106 103  CO2 22 - 32 mmol/L _1 Calcium 8.9 - 10.3 mg/dL 9.1 9.3 9.5  Total Protein 6.5 - 8.1 g/dL 6.7 6.7 7.1  Total Bilirubin 0.3 - 1.2 mg/dL 0.2(L) 0.3 0.3  Alkaline Phos 38 - 126 U/L 78 74 85  AST 15 - 41 U/L 14(L) 17 17  ALT 0 -  44 U/L _0 RADIOGRAPHIC STUDIES: I have personally reviewed the radiological images as listed and agreed with the findings in the report. No results found.   ASSESSMENT & PLAN:  Victoria Barker is a 40 y.o. female with   1. Malignant neoplasm of upper-outer quadrant of left breast, invasive ductal carcinoma and DCIS, stage IIA, pT2N0M0, ER+/PR-/HER2+, Grade III -She was diagnosed in 04/2019. She was found to have 2 left breast masses with DCIS and 1 mass with Invasive ductal carcinoma, her abnormal lymph node was biopsied and found to be negative. -On 07/13/19 she underwent left breast mastectomy with Dr. Prince Solian and reconstruction with Dr. Iran Planas. Tumor was found to be HER2 positive on resected sample and Oncotype testing (ordered before HER2 came back positive) with recurrence score of 56.  -To reduce her high risk of recurrence she completed 6 cycles of adjuvant chemotherapy with TCH (09/02/19-12/16/19). She completed one year trastuzumab therapy in 08/2020 -Given node negative disease and mastectomy, Dr Isidore Moos did not recommend adjuvant radiation.  -She restarted Tamoxifen in 12/2019. Due to insomnia and intermittent nausea she can reduce to 34m but could not tolerate and stopped in early Jan 2022. I switched her to Letrozole in 07/2020, she is tolerating better, hot flashes manageable. -She started ovarian suppression Zoladex injections on 06/15/20, will continue every 4 weeks until  her BSO, which she has not decided when to do yet.  -She began neratinib on 11/09/20.  She has had mild intermittent nausea, and epigastric discomfort on low-dose 1681mdaily. This worsened and caused vomiting following cycle 2. Accordingly, we stopped the medication 12/19/20.  She has recovered well -She will be due for routine mammography in 04/2021. -Continue letrozole and Zoladex injection every 4 weeks -She will f/u in 4 months.   2. Hot Flashes -She has had worsened hot flashes day and night which are effecting her sleep.  -I started her on Effexor in 12/2019. Her hot flashes have improved but are still present. Dose increased to 75 mg on 11/02/20   3. Genetic testing negative for pathogenetic mutations.   4. Right 7th rib lesion -She was found to have a 0.7 x 0.3 cm sclerotic lesion in the right seventh rib on CT scan in March 2021, no prior injury or rib fracture -She is asymptomatic -We will repeat CT chest before next visit     PLAN:  -Continue letrozole -proceed with Zoladex injection today and every 4 weeks -Lab and follow-up in 4 months -CT chest without contrast before next visit -Screening right mammogram in November 2022  No problem-specific Assessment & Plan notes found for this encounter.   Orders Placed This Encounter  Procedures   CT Chest Wo Contrast    Abnormal bone lesion on right 7th rib, f/u    Standing Status:   Future    Standing Expiration Date:   12/28/2021    Order Specific Question:   Is patient pregnant?    Answer:   No    Order Specific Question:   [facility]    [facility]    Standing Status:   Future    Standing Expiration Date:   12/28/2021    Order Specific Question:   Reason for Exam (SYMPTOM  OR DIAGNOSIS REQUIRED)    Answer:   screening    Order Specific Question:   Is the patient pregnant?    Answer:  No    Order Specific Question:   Preferred imaging location?     Answer:   [facility]    All questions were answered. The patient knows to call the clinic with any problems, questions or concerns. No barriers to learning was detected. The total time spent in the appointment was 30 minutes.     Truitt Merle, MD 12/28/2020  I, Wilburn Mylar, am acting as scribe for Truitt Merle, MD.   I have reviewed the above documentation for accuracy and completeness, and I agree with the above.

## 2020-12-28 NOTE — Patient Instructions (Signed)
Goserelin injection What is this medication? GOSERELIN (GOE se rel in) is similar to a hormone found in the body. It lowers the amount of sex hormones that the body makes. Men will have lower testosterone levels and women will have lower estrogen levels while taking this medicine. In men, this medicine is used to treat prostate cancer; the injection is either given once per month or once every 12 weeks. A once per month injection (only) is used to treat women with endometriosis, dysfunctional uterine bleeding, or advanced breast cancer. This medicine may be used for other purposes; ask your health care provider or pharmacist if you have questions. COMMON BRAND NAME(S): Zoladex What should I tell my care team before I take this medication? They need to know if you have any of these conditions: bone problems diabetes heart disease history of irregular heartbeat an unusual or allergic reaction to goserelin, other medicines, foods, dyes, or preservatives pregnant or trying to get pregnant breast-feeding How should I use this medication? This medicine is for injection under the skin. It is given by a health care professional in a hospital or clinic setting. Talk to your pediatrician regarding the use of this medicine in children. Special care may be needed. Overdosage: If you think you have taken too much of this medicine contact a poison control center or emergency room at once. NOTE: This medicine is only for you. Do not share this medicine with others. What if I miss a dose? It is important not to miss your dose. Call your doctor or health care professional if you are unable to keep an appointment. What may interact with this medication? Do not take this medicine with any of the following medications: cisapride dronedarone pimozide thioridazine This medicine may also interact with the following medications: other medicines that prolong the QT interval (an abnormal heart rhythm) This list  may not describe all possible interactions. Give your health care provider a list of all the medicines, herbs, non-prescription drugs, or dietary supplements you use. Also tell them if you smoke, drink alcohol, or use illegal drugs. Some items may interact with your medicine. What should I watch for while using this medication? Visit your doctor or health care provider for regular checks on your progress. Your symptoms may appear to get worse during the first weeks of this therapy. Tell your doctor or healthcare provider if your symptoms do not start to get better or if they get worse after this time. Your bones may get weaker if you take this medicine for a long time. If you smoke or frequently drink alcohol you may increase your risk of bone loss. A family history of osteoporosis, chronic use of drugs for seizures (convulsions), or corticosteroids can also increase your risk of bone loss. Talk to your doctor about how to keep your bones strong. This medicine should stop regular monthly menstruation in women. Tell your doctor if you continue to menstruate. Women should not become pregnant while taking this medicine or for 12 weeks after stopping this medicine. Women should inform their doctor if they wish to become pregnant or think they might be pregnant. There is a potential for serious side effects to an unborn child. Talk to your health care professional or pharmacist for more information. Do not breast-feed an infant while taking this medicine. Men should inform their doctors if they wish to father a child. This medicine may lower sperm counts. Talk to your health care professional or pharmacist for more information. This medicine may   increase blood sugar. Ask your healthcare provider if changes in diet or medicines are needed if you have diabetes. What side effects may I notice from receiving this medication? Side effects that you should report to your doctor or health care professional as soon as  possible: allergic reactions like skin rash, itching or hives, swelling of the face, lips, or tongue bone pain breathing problems changes in vision chest pain feeling faint or lightheaded, falls fever, chills pain, swelling, warmth in the leg pain, tingling, numbness in the hands or feet signs and symptoms of high blood sugar such as being more thirsty or hungry or having to urinate more than normal. You may also feel very tired or have blurry vision signs and symptoms of low blood pressure like dizziness; feeling faint or lightheaded, falls; unusually weak or tired stomach pain swelling of the ankles, feet, hands trouble passing urine or change in the amount of urine unusually high or low blood pressure unusually weak or tired Side effects that usually do not require medical attention (report to your doctor or health care professional if they continue or are bothersome): change in sex drive or performance changes in breast size in both males and females changes in emotions or moods headache hot flashes irritation at site where injected loss of appetite skin problems like acne, dry skin vaginal dryness This list may not describe all possible side effects. Call your doctor for medical advice about side effects. You may report side effects to FDA at 1-800-FDA-1088. Where should I keep my medication? This drug is given in a hospital or clinic and will not be stored at home. NOTE: This sheet is a summary. It may not cover all possible information. If you have questions about this medicine, talk to your doctor, pharmacist, or health care provider.  2022 Elsevier/Gold Standard (2018-09-28 14:05:56)  

## 2020-12-29 ENCOUNTER — Telehealth: Payer: Self-pay | Admitting: Hematology

## 2020-12-29 NOTE — Telephone Encounter (Signed)
Scheduled follow-up appointments per 7/7 los. Patient is aware. 

## 2020-12-30 ENCOUNTER — Encounter: Payer: Self-pay | Admitting: Hematology

## 2021-01-25 ENCOUNTER — Other Ambulatory Visit: Payer: Self-pay

## 2021-01-25 ENCOUNTER — Inpatient Hospital Stay: Payer: Medicaid Other | Attending: Hematology

## 2021-01-25 VITALS — BP 102/89 | HR 88 | Temp 99.3°F | Resp 16

## 2021-01-25 DIAGNOSIS — C50412 Malignant neoplasm of upper-outer quadrant of left female breast: Secondary | ICD-10-CM | POA: Diagnosis present

## 2021-01-25 DIAGNOSIS — Z5111 Encounter for antineoplastic chemotherapy: Secondary | ICD-10-CM | POA: Insufficient documentation

## 2021-01-25 DIAGNOSIS — Z79899 Other long term (current) drug therapy: Secondary | ICD-10-CM | POA: Diagnosis not present

## 2021-01-25 DIAGNOSIS — Z95828 Presence of other vascular implants and grafts: Secondary | ICD-10-CM

## 2021-01-25 MED ORDER — GOSERELIN ACETATE 3.6 MG ~~LOC~~ IMPL
3.6000 mg | DRUG_IMPLANT | Freq: Once | SUBCUTANEOUS | Status: AC
  Administered 2021-01-25: 3.6 mg via SUBCUTANEOUS

## 2021-01-25 MED ORDER — GOSERELIN ACETATE 3.6 MG ~~LOC~~ IMPL
DRUG_IMPLANT | SUBCUTANEOUS | Status: AC
  Filled 2021-01-25: qty 3.6

## 2021-01-25 NOTE — Patient Instructions (Signed)
Goserelin injection What is this medication? GOSERELIN (GOE se rel in) is similar to a hormone found in the body. It lowers the amount of sex hormones that the body makes. Men will have lower testosterone levels and women will have lower estrogen levels while taking this medicine. In men, this medicine is used to treat prostate cancer; the injection is either given once per month or once every 12 weeks. A once per month injection (only) is used to treat women with endometriosis, dysfunctional uterine bleeding, or advanced breast cancer. This medicine may be used for other purposes; ask your health care provider or pharmacist if you have questions. COMMON BRAND NAME(S): Zoladex What should I tell my care team before I take this medication? They need to know if you have any of these conditions: bone problems diabetes heart disease history of irregular heartbeat an unusual or allergic reaction to goserelin, other medicines, foods, dyes, or preservatives pregnant or trying to get pregnant breast-feeding How should I use this medication? This medicine is for injection under the skin. It is given by a health care professional in a hospital or clinic setting. Talk to your pediatrician regarding the use of this medicine in children. Special care may be needed. Overdosage: If you think you have taken too much of this medicine contact a poison control center or emergency room at once. NOTE: This medicine is only for you. Do not share this medicine with others. What if I miss a dose? It is important not to miss your dose. Call your doctor or health care professional if you are unable to keep an appointment. What may interact with this medication? Do not take this medicine with any of the following medications: cisapride dronedarone pimozide thioridazine This medicine may also interact with the following medications: other medicines that prolong the QT interval (an abnormal heart rhythm) This list  may not describe all possible interactions. Give your health care provider a list of all the medicines, herbs, non-prescription drugs, or dietary supplements you use. Also tell them if you smoke, drink alcohol, or use illegal drugs. Some items may interact with your medicine. What should I watch for while using this medication? Visit your doctor or health care provider for regular checks on your progress. Your symptoms may appear to get worse during the first weeks of this therapy. Tell your doctor or healthcare provider if your symptoms do not start to get better or if they get worse after this time. Your bones may get weaker if you take this medicine for a long time. If you smoke or frequently drink alcohol you may increase your risk of bone loss. A family history of osteoporosis, chronic use of drugs for seizures (convulsions), or corticosteroids can also increase your risk of bone loss. Talk to your doctor about how to keep your bones strong. This medicine should stop regular monthly menstruation in women. Tell your doctor if you continue to menstruate. Women should not become pregnant while taking this medicine or for 12 weeks after stopping this medicine. Women should inform their doctor if they wish to become pregnant or think they might be pregnant. There is a potential for serious side effects to an unborn child. Talk to your health care professional or pharmacist for more information. Do not breast-feed an infant while taking this medicine. Men should inform their doctors if they wish to father a child. This medicine may lower sperm counts. Talk to your health care professional or pharmacist for more information. This medicine may   increase blood sugar. Ask your healthcare provider if changes in diet or medicines are needed if you have diabetes. What side effects may I notice from receiving this medication? Side effects that you should report to your doctor or health care professional as soon as  possible: allergic reactions like skin rash, itching or hives, swelling of the face, lips, or tongue bone pain breathing problems changes in vision chest pain feeling faint or lightheaded, falls fever, chills pain, swelling, warmth in the leg pain, tingling, numbness in the hands or feet signs and symptoms of high blood sugar such as being more thirsty or hungry or having to urinate more than normal. You may also feel very tired or have blurry vision signs and symptoms of low blood pressure like dizziness; feeling faint or lightheaded, falls; unusually weak or tired stomach pain swelling of the ankles, feet, hands trouble passing urine or change in the amount of urine unusually high or low blood pressure unusually weak or tired Side effects that usually do not require medical attention (report to your doctor or health care professional if they continue or are bothersome): change in sex drive or performance changes in breast size in both males and females changes in emotions or moods headache hot flashes irritation at site where injected loss of appetite skin problems like acne, dry skin vaginal dryness This list may not describe all possible side effects. Call your doctor for medical advice about side effects. You may report side effects to FDA at 1-800-FDA-1088. Where should I keep my medication? This drug is given in a hospital or clinic and will not be stored at home. NOTE: This sheet is a summary. It may not cover all possible information. If you have questions about this medicine, talk to your doctor, pharmacist, or health care provider.  2022 Elsevier/Gold Standard (2018-09-28 14:05:56)  

## 2021-02-15 ENCOUNTER — Encounter: Payer: Self-pay | Admitting: Hematology

## 2021-02-22 ENCOUNTER — Other Ambulatory Visit: Payer: Self-pay

## 2021-02-22 ENCOUNTER — Inpatient Hospital Stay: Payer: Medicaid Other | Attending: Hematology

## 2021-02-22 ENCOUNTER — Ambulatory Visit: Payer: BC Managed Care – PPO

## 2021-02-22 VITALS — BP 117/87 | HR 81 | Temp 99.6°F | Resp 14

## 2021-02-22 DIAGNOSIS — Z79899 Other long term (current) drug therapy: Secondary | ICD-10-CM | POA: Insufficient documentation

## 2021-02-22 DIAGNOSIS — Z5111 Encounter for antineoplastic chemotherapy: Secondary | ICD-10-CM | POA: Diagnosis not present

## 2021-02-22 DIAGNOSIS — Z95828 Presence of other vascular implants and grafts: Secondary | ICD-10-CM

## 2021-02-22 DIAGNOSIS — C50412 Malignant neoplasm of upper-outer quadrant of left female breast: Secondary | ICD-10-CM | POA: Insufficient documentation

## 2021-02-22 MED ORDER — GOSERELIN ACETATE 3.6 MG ~~LOC~~ IMPL
3.6000 mg | DRUG_IMPLANT | Freq: Once | SUBCUTANEOUS | Status: AC
  Administered 2021-02-22: 3.6 mg via SUBCUTANEOUS
  Filled 2021-02-22: qty 3.6

## 2021-03-02 ENCOUNTER — Encounter: Payer: Self-pay | Admitting: Hematology

## 2021-03-03 ENCOUNTER — Other Ambulatory Visit: Payer: Self-pay | Admitting: Hematology

## 2021-03-05 ENCOUNTER — Encounter: Payer: Self-pay | Admitting: Hematology

## 2021-03-22 ENCOUNTER — Ambulatory Visit: Payer: BC Managed Care – PPO

## 2021-03-24 ENCOUNTER — Other Ambulatory Visit: Payer: Self-pay

## 2021-03-24 ENCOUNTER — Inpatient Hospital Stay: Payer: Medicaid Other | Attending: Hematology

## 2021-03-24 VITALS — BP 105/81 | HR 95 | Temp 98.1°F | Resp 16

## 2021-03-24 DIAGNOSIS — Z5111 Encounter for antineoplastic chemotherapy: Secondary | ICD-10-CM | POA: Diagnosis not present

## 2021-03-24 DIAGNOSIS — C50412 Malignant neoplasm of upper-outer quadrant of left female breast: Secondary | ICD-10-CM | POA: Diagnosis present

## 2021-03-24 DIAGNOSIS — Z95828 Presence of other vascular implants and grafts: Secondary | ICD-10-CM

## 2021-03-24 DIAGNOSIS — Z79899 Other long term (current) drug therapy: Secondary | ICD-10-CM | POA: Diagnosis not present

## 2021-03-24 MED ORDER — GOSERELIN ACETATE 3.6 MG ~~LOC~~ IMPL
DRUG_IMPLANT | SUBCUTANEOUS | Status: AC
  Administered 2021-03-24: 3.6 mg
  Filled 2021-03-24: qty 3.6

## 2021-03-24 MED ORDER — GOSERELIN ACETATE 3.6 MG ~~LOC~~ IMPL: 3.6000 mg | DRUG_IMPLANT | Freq: Once | SUBCUTANEOUS | Status: DC

## 2021-04-19 ENCOUNTER — Encounter: Payer: Self-pay | Admitting: Hematology

## 2021-04-19 ENCOUNTER — Inpatient Hospital Stay: Payer: Medicaid Other

## 2021-04-19 ENCOUNTER — Other Ambulatory Visit: Payer: Self-pay

## 2021-04-19 ENCOUNTER — Inpatient Hospital Stay (HOSPITAL_BASED_OUTPATIENT_CLINIC_OR_DEPARTMENT_OTHER): Payer: Medicaid Other | Admitting: Hematology

## 2021-04-19 VITALS — BP 110/77 | HR 95 | Temp 98.6°F | Resp 17 | Ht 62.0 in | Wt 128.9 lb

## 2021-04-19 DIAGNOSIS — C50912 Malignant neoplasm of unspecified site of left female breast: Secondary | ICD-10-CM | POA: Diagnosis not present

## 2021-04-19 DIAGNOSIS — Z5111 Encounter for antineoplastic chemotherapy: Secondary | ICD-10-CM | POA: Diagnosis not present

## 2021-04-19 DIAGNOSIS — Z170 Estrogen receptor positive status [ER+]: Secondary | ICD-10-CM

## 2021-04-19 DIAGNOSIS — Z95828 Presence of other vascular implants and grafts: Secondary | ICD-10-CM

## 2021-04-19 DIAGNOSIS — C50412 Malignant neoplasm of upper-outer quadrant of left female breast: Secondary | ICD-10-CM

## 2021-04-19 LAB — CBC WITH DIFFERENTIAL (CANCER CENTER ONLY)
Abs Immature Granulocytes: 0.01 10*3/uL (ref 0.00–0.07)
Basophils Absolute: 0.1 10*3/uL (ref 0.0–0.1)
Basophils Relative: 1 %
Eosinophils Absolute: 0.1 10*3/uL (ref 0.0–0.5)
Eosinophils Relative: 1 %
HCT: 38.6 % (ref 36.0–46.0)
Hemoglobin: 13 g/dL (ref 12.0–15.0)
Immature Granulocytes: 0 %
Lymphocytes Relative: 48 %
Lymphs Abs: 2.8 10*3/uL (ref 0.7–4.0)
MCH: 29.1 pg (ref 26.0–34.0)
MCHC: 33.7 g/dL (ref 30.0–36.0)
MCV: 86.5 fL (ref 80.0–100.0)
Monocytes Absolute: 0.3 10*3/uL (ref 0.1–1.0)
Monocytes Relative: 6 %
Neutro Abs: 2.6 10*3/uL (ref 1.7–7.7)
Neutrophils Relative %: 44 %
Platelet Count: 229 10*3/uL (ref 150–400)
RBC: 4.46 MIL/uL (ref 3.87–5.11)
RDW: 12.8 % (ref 11.5–15.5)
WBC Count: 5.8 10*3/uL (ref 4.0–10.5)
nRBC: 0 % (ref 0.0–0.2)

## 2021-04-19 LAB — CMP (CANCER CENTER ONLY)
ALT: 9 U/L (ref 0–44)
AST: 15 U/L (ref 15–41)
Albumin: 3.9 g/dL (ref 3.5–5.0)
Alkaline Phosphatase: 85 U/L (ref 38–126)
Anion gap: 10 (ref 5–15)
BUN: 12 mg/dL (ref 6–20)
CO2: 26 mmol/L (ref 22–32)
Calcium: 9.5 mg/dL (ref 8.9–10.3)
Chloride: 105 mmol/L (ref 98–111)
Creatinine: 0.84 mg/dL (ref 0.44–1.00)
GFR, Estimated: >60 mL/min (ref >60)
Glucose, Bld: 82 mg/dL (ref 70–99)
Potassium: 3.5 mmol/L (ref 3.5–5.1)
Sodium: 141 mmol/L (ref 135–145)
Total Bilirubin: 0.6 mg/dL (ref 0.3–1.2)
Total Protein: 7 g/dL (ref 6.5–8.1)

## 2021-04-19 MED ORDER — GOSERELIN ACETATE 3.6 MG ~~LOC~~ IMPL
3.6000 mg | DRUG_IMPLANT | Freq: Once | SUBCUTANEOUS | Status: AC
  Administered 2021-04-19: 3.6 mg via SUBCUTANEOUS
  Filled 2021-04-19: qty 3.6

## 2021-04-19 NOTE — Progress Notes (Signed)
Victoria Barker   [phone] [phone]   Clinic Follow up Note   Patient Care Team: [name], MD as PCP - General (Obstetrics and Gynecology) Mauro Kaufmann, RN as Oncology Nurse Navigator Rockwell Germany, RN as Oncology Nurse Navigator Donnie Mesa, MD as Consulting Physician (General Surgery) Truitt Merle, MD as Consulting Physician (Hematology) Eppie Gibson, MD as Attending Physician (Radiation Oncology)  Date of Service:  04/19/2021  CHIEF COMPLAINT: f/u of left breast cancer  CURRENT THERAPY:  -Tamoxifen 82m daily starting 05/26/19. Stopped before surgery and chemo on 07/13/19. Restart in 12/2019. Due to insomnia and nausea, reduced dose to 10 in 05/2020. Switched to Letrozole in 07/2020. -Started her on Ovarian suppression Zoladex on 06/15/20.  ASSESSMENT & PLAN:  Victoria Barker a 40y.o. female with   1. Malignant neoplasm of upper-outer quadrant of left breast, invasive ductal carcinoma and DCIS, stage IIA, pT2N0M0, ER+/PR-/HER2+, Grade III -diagnosed in 04/2019 with pathology showing 2 left breast masses with DCIS and 1 mass with IDC, biopsied lymph node was negative. -On 07/13/19 she underwent left breast mastectomy with Dr. TPrince Solianand reconstruction with Dr. TIran Planas Tumor was found to be HER2 positive on resected sample and Oncotype testing (ordered before HER2 came back positive) with recurrence score of 56.  -To reduce her high risk of recurrence she completed 6 cycles of adjuvant chemotherapy with TCH (09/02/19-12/16/19). She completed one year trastuzumab therapy in 08/2020 -She restarted Tamoxifen in 12/2019. Due to insomnia and intermittent nausea she can reduce to 184mbut could not tolerate and stopped in early Jan 2022. I switched her to Letrozole in 07/2020, she is tolerating better, hot flashes manageable. -She started ovarian suppression Zoladex injections on 06/15/20, will continue every 4 weeks until her BSO, which she has  not decided when to do yet. She will discuss this with her GYN. -She began neratinib on 11/09/20 but could not tolerate and we stopped the medication 12/19/20.  She has recovered well -She is scheduled for routine mammography in 05/08/21. -Continue letrozole and Zoladex injection every 4 weeks, she has not decided when to have BSO  -She will f/u in 4 months.   2. Hot Flashes -She has had worsened hot flashes day and night which are effecting her sleep.  -I started her on Effexor in 12/2019. Her hot flashes have improved but are still present. Dose increased to 75 mg on 11/02/20   3. Genetic testing negative for pathogenetic mutations.    4. Right 7th rib lesion -She was found to have a 0.7 x 0.3 cm sclerotic lesion in the right seventh rib on CT scan in March 2021, no prior injury or rib fracture -She is asymptomatic -We will repeat CT chest before next visit     PLAN:  -proceed with Zoladex injection today and every 4 weeks  -she prefers Saturdays due to her new job -Continue letrozole -Screening right mammogram 05/08/21 -will plan for repeat chest CT in the next month, will call her with results  -she is scheduled to see her GYN on 05/22/21, I encouraged her to discuss BSO -lab and f/u in 4 months   No problem-specific Assessment & Plan notes found for this encounter.   SUMMARY OF ONCOLOGIC HISTORY: Oncology History Overview Note  Cancer Staging Malignant neoplasm of upper-outer quadrant of left breast in female, estrogen receptor positive ([place] form: Breast, AJCC 8th Edition - Clinical stage from 04/28/2019: Stage IIA (cT2, cN0, cM0, G2, ER+, PR-, HER2: Equivocal) -  Signed by Truitt Merle, MD on 05/04/2019 - Pathologic stage from 07/13/2019: Stage IIA (pT2, pN0, cM0, G3, ER+, PR-, HER2+, Oncotype DX score: 56) - Signed by Truitt Merle, MD on 08/20/2019    Malignant neoplasm of upper-outer quadrant of left breast in female, estrogen receptor positive ([name])  04/21/2019 Mammogram    Diagnostic mammogram 04/21/19  IMPRESSION 1. findings highly suspicious for left breast cancer. There is a palpable irregular mass 4cm from nipple measuring 2.4x2.4x1.8cm in the 1:00 position axis of the left breast and pleomorphic  calcifications in the upper-outer quadrant as described above.  2. single left 0.9x0.5cm axillary lymph node with a mild/borderline thickened cortex.    04/28/2019 Cancer Staging   Staging form: Breast, AJCC 8th Edition - Clinical stage from 04/28/2019: Stage IIA (cT2, cN0, cM0, G2, ER+, PR-, HER2: Equivocal) - Signed by Truitt Merle, MD on 05/04/2019    04/28/2019 Initial Biopsy   Diagnosis 04/28/19 1. Breast, left, needle core biopsy, 1 o'clock position - INVASIVE DUCTAL CARCINOMA. - DUCTAL CARCINOMA IN SITU. - SEE COMMENT. 2. Lymph node, needle/core biopsy, left axilla - THERE IS NO EVIDENCE OF CARCINOMA IN 1 OF 1 LYMPH NODE (0/1). 3. Breast, left, needle core biopsy, upper, outer quadrant - DUCTAL CARCINOMA IN SITU WITH CALCIFICATIONS. - SEE COMMENT.   04/28/2019 Receptors her2   1. PROGNOSTIC INDICATORS Results: HER2 negative  Estrogen Receptor: 80%, POSITIVE, STRONG STAINING INTENSITY Progesterone Receptor: 0%, NEGATIVE Proliferation Marker Ki67: 35%    04/30/2019 Initial Diagnosis   Malignant neoplasm of upper-outer quadrant of left breast in female, estrogen receptor positive ([name])    Genetic Testing   No pathogenic variants identified. 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 The final report date is 05/28/2019.  The STAT Breast cancer panel offered by [facility] includes sequencing and rearrangement analysis for the following 9 genes:  ATM, BRCA1, BRCA2, CDH1, CHEK2, PALB2, PTEN, STK11 and TP53.    The Common Hereditary Cancers Panel offered by [facility] includes sequencing and/or deletion duplication testing of the following 48 genes: APC, ATM, AXIN2, BARD1, BMPR1A, BRCA1,  BRCA2, BRIP1, CDH1, CDKN2A (p14ARF), CDKN2A (p16INK4a), CKD4, CHEK2, CTNNA1, DICER1, EPCAM (Deletion/duplication testing only), GREM1 (promoter region deletion/duplication testing only), KIT, MEN1, MLH1, MSH2, MSH3, MSH6, MUTYH, NBN, NF1, NHTL1, PALB2, PDGFRA, PMS2, POLD1, POLE, PTEN, RAD50, RAD51C, RAD51D, RNF43, SDHB, SDHC, SDHD, SMAD4, SMARCA4. STK11, TP53, TSC1, TSC2, and VHL.  The following genes were evaluated for sequence changes only: SDHA and HOXB13 c.251G>A variant only.   05/11/2019 Breast MRI   IMPRESSION: 1. Enhancing mass and architectural distortion spanning approximately 6 x 3 x 5.3 cm in the upper outer left breast consistent with the patient's biopsy-proven site of invasive cancer. 2. Biopsy changes in the anterior left breast at the site of the patient's biopsy-proven ductal carcinoma in situ. 3. Suspicious changes within the upper-outer quadrant of the left breast spanning a total of 8 cm in the AP dimension. This includes the site of biopsy-proven DCIS anteriorly and suspicious enhancement extending to the level of the pectoralis muscle posteriorly. 4. Suspicious 7 mm enhancing mass in the far posterior slightly lateral left breast (image 138/212). 5. No MRI evidence of malignancy on the right. 6. No suspicious lymphadenopathy.     05/26/2019 -  Anti-estrogen oral therapy   Tamoxifen 52m daily starting 05/26/19. Stopped before surgery and chemo on 07/13/19. Restart in 12/2019. Due to insomnia and nausea, reduced dose to 10 in 05/2020.  ---------Started  her on Ovarian suppression Zoladex on 06/15/20.  ----------Switched to Letrozole in 07/2020.    07/13/2019 Surgery   LEFT NIPPLE SPARING MASTECTOMY WITH SENTINEL LYMPH NODE BIOPSY and LEFT BREAST RECONSTRUCTION WITH PLACEMENT OF TISSUE EXPANDER AND ALLODERM by D.r Tsuie and Dr Iran Planas.    07/13/2019 Pathology Results   SURGICAL PATHOLOGY   FINAL MICROSCOPIC DIAGNOSIS:   A. LYMPH NODE, LEFT AXILLARY #1, SENTINEL,  BIOPSY:  - One of one lymph nodes negative for carcinoma (0/1).   B. BREAST, LEFT, MASTECTOMY:  - Invasive ductal carcinoma, grade 3, spanning 3.4 cm.  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  - Invasive carcinoma is 0.2 cm from the posterior margin focally (not on  ink) and 0.4 cm from the  anterior margin focally (not on ink).  - In situ carcinoma is 0.4 cm from the posterior margin focally (not on  ink).  - Biopsy site x2.  - See oncology table.   C. NIPPLE, LEFT, BIOPSY:  - Benign nipple tissue.   D. LYMPH NODE, LEFT AXILLARY #2, SENTINEL, BIOPSY:  - One of one lymph nodes negative for carcinoma (0/1).     GROUP 1:  HER2 **POSITIVE**    07/13/2019 Oncotype testing   Ocotype score 56 with 39% risk of recurrence with Tmaoxifen or AI alone. There is a 15% benefit from adjuvant chemo.     07/13/2019 Cancer Staging   Staging form: Breast, AJCC 8th Edition - Pathologic stage from 07/13/2019: Stage IIA (pT2, pN0, cM0, G3, ER+, PR-, HER2+, Oncotype DX score: 56) - Signed by Truitt Merle, MD on 08/20/2019    08/25/2019 Echocardiogram   Baseline Echo  IMPRESSIONS     1. Left ventricular ejection fraction, by estimation, is 65 to 70%. The  left ventricle has normal function. The left ventricle has no regional  wall motion abnormalities. Left ventricular diastolic parameters were  normal.   2. Right ventricular systolic function is normal. The right ventricular  size is normal. There is normal pulmonary artery systolic pressure.   3. Left atrial size was mildly dilated.   4. The mitral valve is normal in structure and function. Trivial mitral  valve regurgitation.   5. The aortic valve is tricuspid. Aortic valve regurgitation is not  visualized.   6. The inferior vena cava is normal in size with greater than 50%  respiratory variability, suggesting right atrial pressure of 3 mmHg.   7. The aortic valve is not well seen. It is trileaflet. There appears to  be some  calcification associated with the non-coronary cusp but this is  not well seen.    08/25/2019 Imaging   Whole body Bone Scan  IMPRESSION: No significant abnormality identified.   08/25/2019 Imaging   CT CAP w Contrast  IMPRESSION: 1. No findings of metastatic disease to the chest, abdomen, or pelvis. 2. 0.7 by 0.3 cm sclerotic lesion in the right seventh rib laterally is likely benign but merits surveillance. 3. Mild sclerosis along the sacroiliac joints bilaterally, query mild chronic sacroiliitis.     09/01/2019 Procedure   INSERTION PORT-A-CATH WITH ULTRASOUND GUIDANCE by Dr. Georgette Dover   09/02/2019 -  Chemotherapy   TCH (Taxol, Carboplatin, Herceptin) q3weeks for 6 cycles starting 09/02/19-12/16/19, followed by anti-HER2 maintenance therapy with Herceptin q3weeks starting 01/06/20 to complete 1 year of treatment (from 08/2019)     05/05/2020 Surgery   REMOVAL OF LEFT CHEST TISSUE EXPANDER AND PLACEMENT OF IMPLANT and RIGHT BREAST MASTOPEXY and LIPOFILLING FROM ABDOMEN TO RIGHT CHEST by  Dr Iran Planas   11/09/2020 - 12/19/2020 Chemotherapy   Neratinib, stopped due to severe nausea/vomiting    Invasive ductal carcinoma of breast, female, left ([place])  07/13/2019 Initial Diagnosis   Invasive ductal carcinoma of breast, female, left ([place])   11/09/2020 - 12/19/2020 Chemotherapy   Neratinib, stopped due to severe nausea/vomiting       INTERVAL HISTORY:  Kashlynn Kundert is here for a follow up of breast clinic. She was last seen by me on 12/28/20. She presents to the clinic alone. She reports she still has hot flashes from the letrozole, but these are tolerable. She notes her job changed, so she is in the middle of switching insurance.   All other systems were reviewed with the patient and are negative.  MEDICAL HISTORY:  Past Medical History:  Diagnosis Date   Breast cancer ([place]) 2020   Left Mastectomy   Cancer ([place])    Left breast Ca   GERD (gastroesophageal reflux disease)     Personal history of chemotherapy 08/2019-11/2019   Scoliosis    Sickle cell trait ([place])     SURGICAL HISTORY: Past Surgical History:  Procedure Laterality Date   BREAST IMPLANT EXCHANGE Left 09/26/2020   Procedure: REVISION LEFT BREAST RECONSTRUCTION WITH SILICONE IMPLANT EXCHANGE;  Surgeon: Irene Limbo, MD;  Location: [place] [facility];  Service: Plastics;  Laterality: Left;   BREAST RECONSTRUCTION WITH PLACEMENT OF TISSUE EXPANDER AND ALLODERM Left 07/13/2019   Procedure: LEFT BREAST RECONSTRUCTION WITH PLACEMENT OF TISSUE EXPANDER AND ALLODERM;  Surgeon: Irene Limbo, MD;  Location: [place];  Service: Plastics;  Laterality: Left;   BREAST REDUCTION SURGERY Right 09/26/2020   Procedure: REVISION RIGHT BREAST REDUCTION;  Surgeon: Irene Limbo, MD;  Location: [place];  Service: Plastics;  Laterality: Right;   DEBRIDEMENT AND CLOSURE WOUND Left 07/30/2019   Procedure: DEBRIDEMENT LEFT MASTECTOMY FLAP;  Surgeon: Irene Limbo, MD;  Location: [place];  Service: Plastics;  Laterality: Left;   INDUCED ABORTION  2017   LIPOSUCTION WITH LIPOFILLING Right 05/05/2020   Procedure: LIPOFILLING FROM ABDOMEN TO RIGHT CHEST;  Surgeon: Irene Limbo, MD;  Location: [place];  Service: Plastics;  Laterality: Right;   MASTECTOMY Left 07/13/2019   MASTOPEXY Right 05/05/2020   Procedure: RIGHT BREAST MASTOPEXY;  Surgeon: Irene Limbo, MD;  Location: [place];  Service: Plastics;  Laterality: Right;   NIPPLE SPARING MASTECTOMY WITH SENTINEL LYMPH NODE BIOPSY Left 07/13/2019   Procedure: LEFT NIPPLE SPARING MASTECTOMY WITH SENTINEL LYMPH NODE BIOPSY;  Surgeon: Donnie Mesa, MD;  Location: [place];  Service: General;  Laterality: Left;   PORT-A-CATH REMOVAL Right 09/26/2020   Procedure: REMOVAL RIGHT CHEST PORT;  Surgeon: Irene Limbo, MD;  Location: [place];  Service: Plastics;  Laterality:  Right;   PORTACATH PLACEMENT Right 09/01/2019   Procedure: INSERTION PORT-A-CATH WITH ULTRASOUND GUIDANCE;  Surgeon: Donnie Mesa, MD;  Location: [place];  Service: General;  Laterality: Right;   REMOVAL OF TISSUE EXPANDER AND PLACEMENT OF IMPLANT Left 05/05/2020   Procedure: REMOVAL OF LEFT CHEST TISSUE EXPANDER AND PLACEMENT OF IMPLANT;  Surgeon: Irene Limbo, MD;  Location: [place];  Service: Plastics;  Laterality: Left;   TISSUE EXPANDER PLACEMENT Left 07/30/2019   Procedure: TISSUE EXPANSION LEFT CHEST;  Surgeon: Irene Limbo, MD;  Location: [place];  Service: Plastics;  Laterality: Left;    I have reviewed the social history and family history with the patient and they  are unchanged from previous note.  ALLERGIES:  is allergic to bactrim [sulfamethoxazole-trimethoprim].  MEDICATIONS:  Current Outpatient Medications  Medication Sig Dispense Refill   Lactobacillus-Inulin (CULTURELLE DIGESTIVE HEALTH PO) Take 1 capsule by mouth daily as needed (digestive health.).      letrozole (FEMARA) 2.5 MG tablet TAKE 1 TABLET BY MOUTH EVERY DAY 90 tablet 1   lidocaine-prilocaine (EMLA) cream Apply 1 application topically as needed. 30 g 0   venlafaxine XR (EFFEXOR-XR) 75 MG 24 hr capsule TAKE 1 CAPSULE BY MOUTH DAILY WITH BREAKFAST. 90 capsule 2   No current facility-administered medications for this visit.    PHYSICAL EXAMINATION: ECOG PERFORMANCE STATUS: 0 - Asymptomatic  Vitals:   04/19/21 0907  BP: 110/77  Pulse: 95  Resp: 17  Temp: 98.6 F (37 C)  SpO2: 100%   Wt Readings from Last 3 Encounters:  04/19/21 58.5 kg  12/28/20 59.5 kg  11/30/20 59.3 kg     GENERAL:alert, no distress and comfortable SKIN: skin color, texture, turgor are normal, no rashes or significant lesions EYES: normal, Conjunctiva are pink and non-injected, sclera clear  NECK: supple, thyroid normal size, non-tender, without nodularity LYMPH:   no palpable lymphadenopathy in the cervical, axillary  LUNGS: clear to auscultation and percussion with normal breathing effort HEART: regular rate & rhythm and no murmurs and no lower extremity edema ABDOMEN:abdomen soft, non-tender and normal bowel sounds Musculoskeletal:no cyanosis of digits and no clubbing  NEURO: alert & oriented x 3 with fluent speech, no focal motor/sensory deficits BREAST: No palpable mass, nodules or adenopathy bilaterally. Breast exam benign.   LABORATORY DATA:  I have reviewed the data as listed CBC Latest Ref Rng & Units 04/19/2021 12/28/2020 11/30/2020  WBC 4.0 - 10.5 K/uL 5.8 5.4 6.6  Hemoglobin 12.0 - 15.0 g/dL 13.0 12.8 13.2  Hematocrit 36.0 - 46.0 % 38.6 37.6 39.6  Platelets 150 - 400 K/uL 229 220 227     CMP Latest Ref Rng & Units 04/19/2021 12/28/2020 11/30/2020  Glucose 70 - 99 mg/dL 82 102(H) 100(H)  BUN 6 - 20 mg/dL 12 17 16   Creatinine 0.44 - 1.00 mg/dL 0.84 0.81 0.81  Sodium 135 - 145 mmol/L 141 142 143  Potassium 3.5 - 5.1 mmol/L 3.5 3.9 4.0  Chloride 98 - 111 mmol/L 105 106 106  CO2 22 - 32 mmol/L 26 28 27   Calcium 8.9 - 10.3 mg/dL 9.5 9.1 9.3  Total Protein 6.5 - 8.1 g/dL 7.0 6.7 6.7  Total Bilirubin 0.3 - 1.2 mg/dL 0.6 0.2(L) 0.3  Alkaline Phos 38 - 126 U/L 85 78 74  AST 15 - 41 U/L 15 14(L) 17  ALT 0 - 44 U/L 9 8 12       RADIOGRAPHIC STUDIES: I have personally reviewed the radiological images as listed and agreed with the findings in the report. No results found.    No orders of the defined types were placed in this encounter.  All questions were answered. The patient knows to call the clinic with any problems, questions or concerns. No barriers to learning was detected. The total time spent in the appointment was 30 minutes.     Truitt Merle, MD 04/19/2021   I, Wilburn Mylar, am acting as scribe for Truitt Merle, MD.   I have reviewed the above documentation for accuracy and completeness, and I agree with the above.

## 2021-04-19 NOTE — Patient Instructions (Signed)
Goserelin injection What is this medication? GOSERELIN (GOE se rel in) is similar to a hormone found in the body. It lowers the amount of sex hormones that the body makes. Men will have lower testosterone levels and women will have lower estrogen levels while taking this medicine. In men, this medicine is used to treat prostate cancer; the injection is either given once per month or once every 12 weeks. A once per month injection (only) is used to treat women with endometriosis, dysfunctional uterine bleeding, or advanced breast cancer. This medicine may be used for other purposes; ask your health care provider or pharmacist if you have questions. COMMON BRAND NAME(S): Zoladex What should I tell my care team before I take this medication? They need to know if you have any of these conditions: bone problems diabetes heart disease history of irregular heartbeat an unusual or allergic reaction to goserelin, other medicines, foods, dyes, or preservatives pregnant or trying to get pregnant breast-feeding How should I use this medication? This medicine is for injection under the skin. It is given by a health care professional in a hospital or clinic setting. Talk to your pediatrician regarding the use of this medicine in children. Special care may be needed. Overdosage: If you think you have taken too much of this medicine contact a poison control center or emergency room at once. NOTE: This medicine is only for you. Do not share this medicine with others. What if I miss a dose? It is important not to miss your dose. Call your doctor or health care professional if you are unable to keep an appointment. What may interact with this medication? Do not take this medicine with any of the following medications: cisapride dronedarone pimozide thioridazine This medicine may also interact with the following medications: other medicines that prolong the QT interval (an abnormal heart rhythm) This list  may not describe all possible interactions. Give your health care provider a list of all the medicines, herbs, non-prescription drugs, or dietary supplements you use. Also tell them if you smoke, drink alcohol, or use illegal drugs. Some items may interact with your medicine. What should I watch for while using this medication? Visit your doctor or health care provider for regular checks on your progress. Your symptoms may appear to get worse during the first weeks of this therapy. Tell your doctor or healthcare provider if your symptoms do not start to get better or if they get worse after this time. Your bones may get weaker if you take this medicine for a long time. If you smoke or frequently drink alcohol you may increase your risk of bone loss. A family history of osteoporosis, chronic use of drugs for seizures (convulsions), or corticosteroids can also increase your risk of bone loss. Talk to your doctor about how to keep your bones strong. This medicine should stop regular monthly menstruation in women. Tell your doctor if you continue to menstruate. Women should not become pregnant while taking this medicine or for 12 weeks after stopping this medicine. Women should inform their doctor if they wish to become pregnant or think they might be pregnant. There is a potential for serious side effects to an unborn child. Talk to your health care professional or pharmacist for more information. Do not breast-feed an infant while taking this medicine. Men should inform their doctors if they wish to father a child. This medicine may lower sperm counts. Talk to your health care professional or pharmacist for more information. This medicine may   increase blood sugar. Ask your healthcare provider if changes in diet or medicines are needed if you have diabetes. What side effects may I notice from receiving this medication? Side effects that you should report to your doctor or health care professional as soon as  possible: allergic reactions like skin rash, itching or hives, swelling of the face, lips, or tongue bone pain breathing problems changes in vision chest pain feeling faint or lightheaded, falls fever, chills pain, swelling, warmth in the leg pain, tingling, numbness in the hands or feet signs and symptoms of high blood sugar such as being more thirsty or hungry or having to urinate more than normal. You may also feel very tired or have blurry vision signs and symptoms of low blood pressure like dizziness; feeling faint or lightheaded, falls; unusually weak or tired stomach pain swelling of the ankles, feet, hands trouble passing urine or change in the amount of urine unusually high or low blood pressure unusually weak or tired Side effects that usually do not require medical attention (report to your doctor or health care professional if they continue or are bothersome): change in sex drive or performance changes in breast size in both males and females changes in emotions or moods headache hot flashes irritation at site where injected loss of appetite skin problems like acne, dry skin vaginal dryness This list may not describe all possible side effects. Call your doctor for medical advice about side effects. You may report side effects to FDA at 1-800-FDA-1088. Where should I keep my medication? This drug is given in a hospital or clinic and will not be stored at home. NOTE: This sheet is a summary. It may not cover all possible information. If you have questions about this medicine, talk to your doctor, pharmacist, or health care provider.  2022 Elsevier/Gold Standard (2018-09-28 14:05:56)  

## 2021-04-20 ENCOUNTER — Telehealth: Payer: Self-pay | Admitting: Hematology

## 2021-04-20 NOTE — Telephone Encounter (Signed)
Scheduled follow-up appointments per 10/27 los. Patient is aware. 

## 2021-05-08 ENCOUNTER — Ambulatory Visit: Payer: Medicaid Other

## 2021-05-19 ENCOUNTER — Inpatient Hospital Stay: Payer: Medicaid Other | Attending: Hematology

## 2021-05-19 ENCOUNTER — Other Ambulatory Visit: Payer: Self-pay

## 2021-05-19 VITALS — BP 109/79 | HR 92 | Temp 98.1°F | Resp 15

## 2021-05-19 DIAGNOSIS — Z5111 Encounter for antineoplastic chemotherapy: Secondary | ICD-10-CM | POA: Diagnosis not present

## 2021-05-19 DIAGNOSIS — C50412 Malignant neoplasm of upper-outer quadrant of left female breast: Secondary | ICD-10-CM | POA: Diagnosis present

## 2021-05-19 DIAGNOSIS — Z79899 Other long term (current) drug therapy: Secondary | ICD-10-CM | POA: Insufficient documentation

## 2021-05-19 DIAGNOSIS — Z95828 Presence of other vascular implants and grafts: Secondary | ICD-10-CM

## 2021-05-19 MED ORDER — GOSERELIN ACETATE 3.6 MG ~~LOC~~ IMPL
3.6000 mg | DRUG_IMPLANT | Freq: Once | SUBCUTANEOUS | Status: AC
  Administered 2021-05-19: 3.6 mg via SUBCUTANEOUS
  Filled 2021-05-19: qty 3.6

## 2021-05-22 IMAGING — MG DIGITAL SCREENING UNILAT RIGHT W/ CAD
2 series · 2 of 2 positions shown · non-contrast
Comparison: Previous exam(s).

CLINICAL DATA: Screening. Status post left mastectomy.

EXAM:
DIGITAL SCREENING UNILATERAL RIGHT MAMMOGRAM WITH CAD

[R MLO]
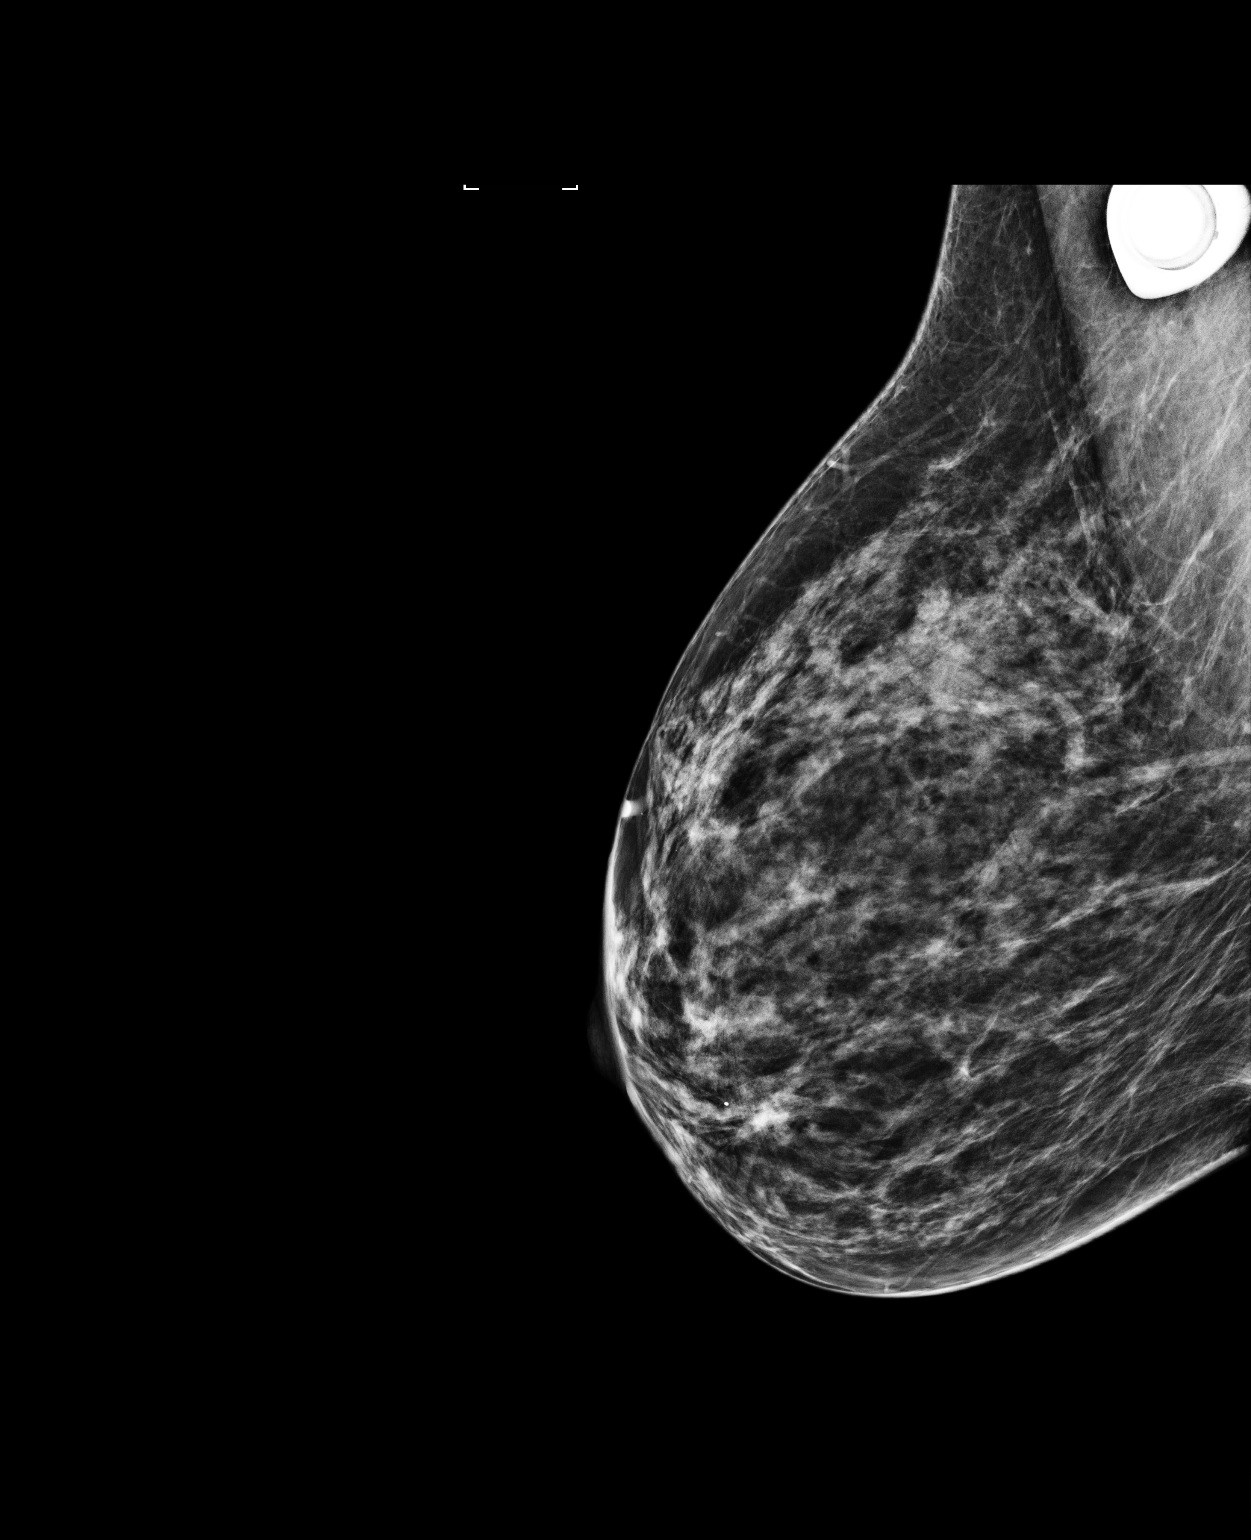

[R CC]
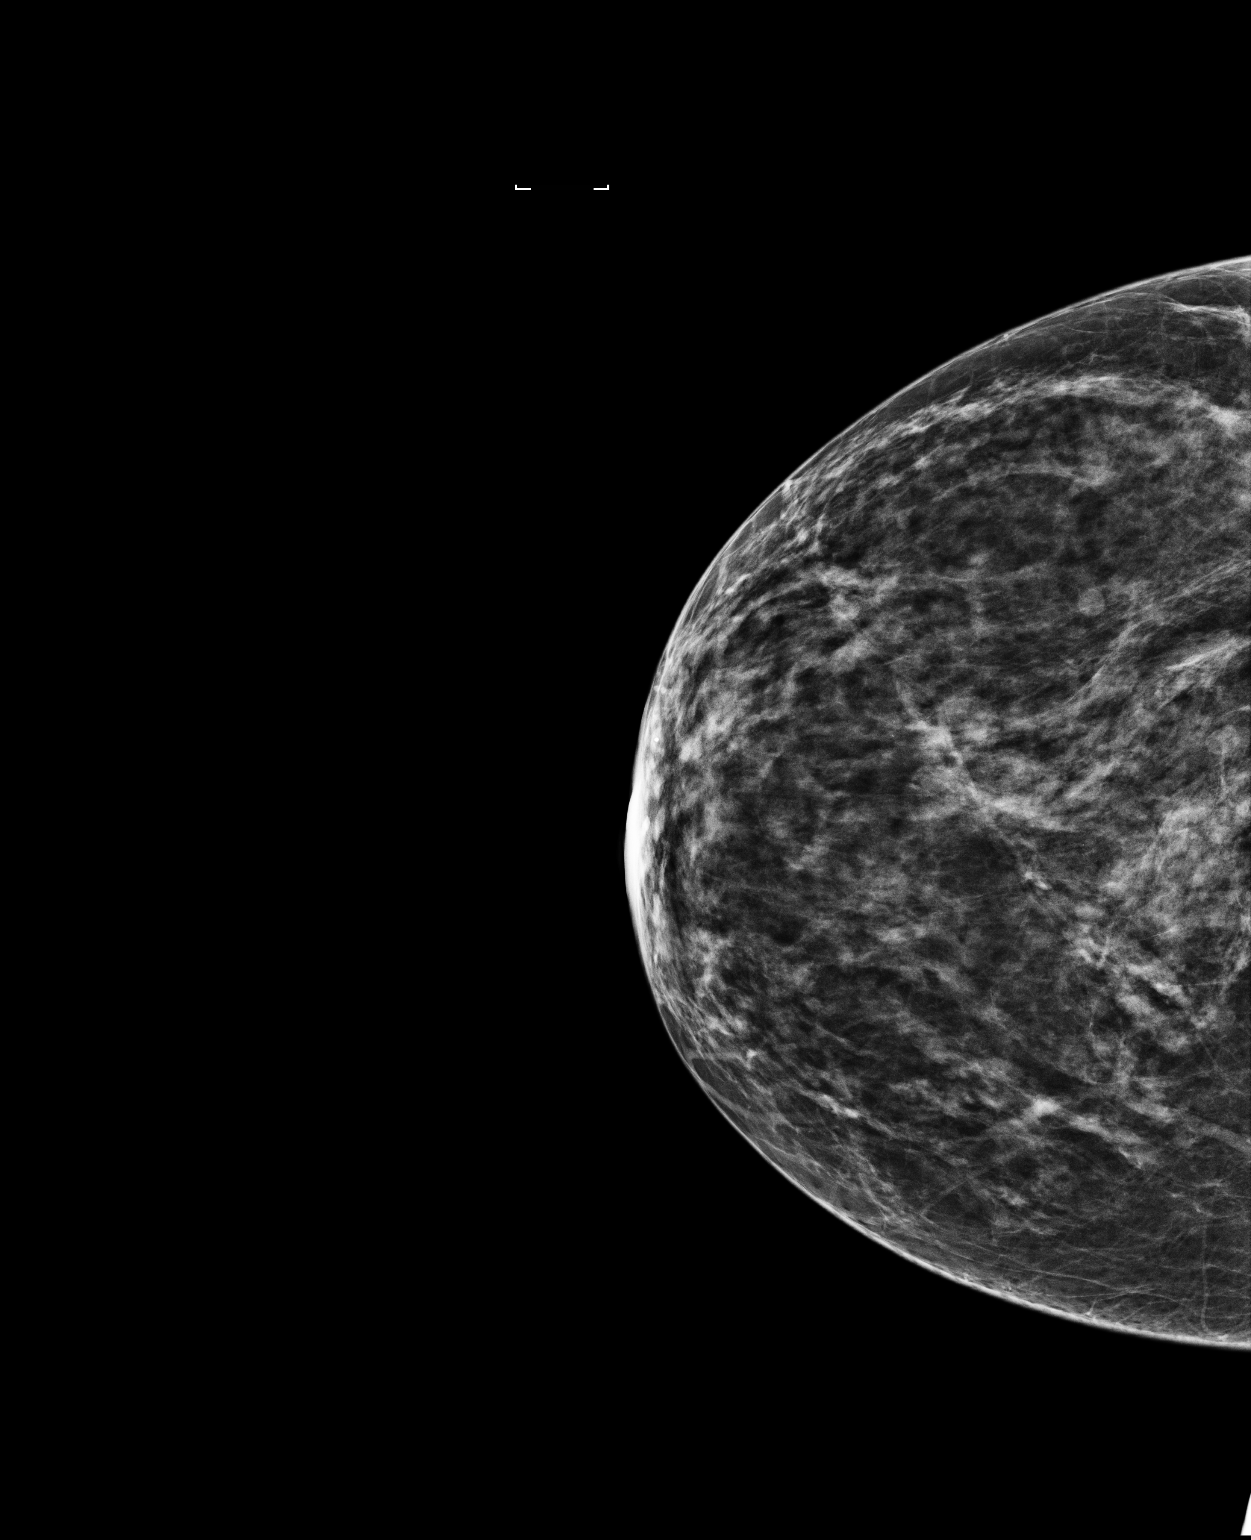

[2 of 2 positions shown; findings below may reference images not displayed]

ACR Breast Density Category c: The breast tissue is heterogeneously
dense, which may obscure small masses.
FINDINGS: In the right breast, a possible mass in the posterior aspect of the
upper-outer quadrant warrants further evaluation. The left breast is
surgically absent. Images were processed with CAD.
IMPRESSION: Further evaluation is suggested for a possible mass in the right
breast.

RECOMMENDATION:
Diagnostic mammogram and possibly ultrasound of the right breast.
(Code:UF-[id number])

The patient will be contacted regarding the findings, and additional
imaging will be scheduled.

BI-RADS CATEGORY  0: Incomplete. Need additional imaging evaluation
and/or prior mammograms for comparison.

## 2021-06-01 IMAGING — US US BREAST*R* LIMITED INC AXILLA
1 series · 9 of 9 positions shown · non-contrast
Comparison: Previous exam(s).

CLINICAL DATA: Patient was recalled from 2D screening mammogram for
a right breast mass.

EXAM:
DIGITAL DIAGNOSTIC RIGHT MAMMOGRAM WITH CAD AND TOMO
ULTRASOUND RIGHT BREAST

[Series 1: us breast*right* limited inc axilla · 0.05mm/px · 9 of 9 slices shown]
[im 1/9]
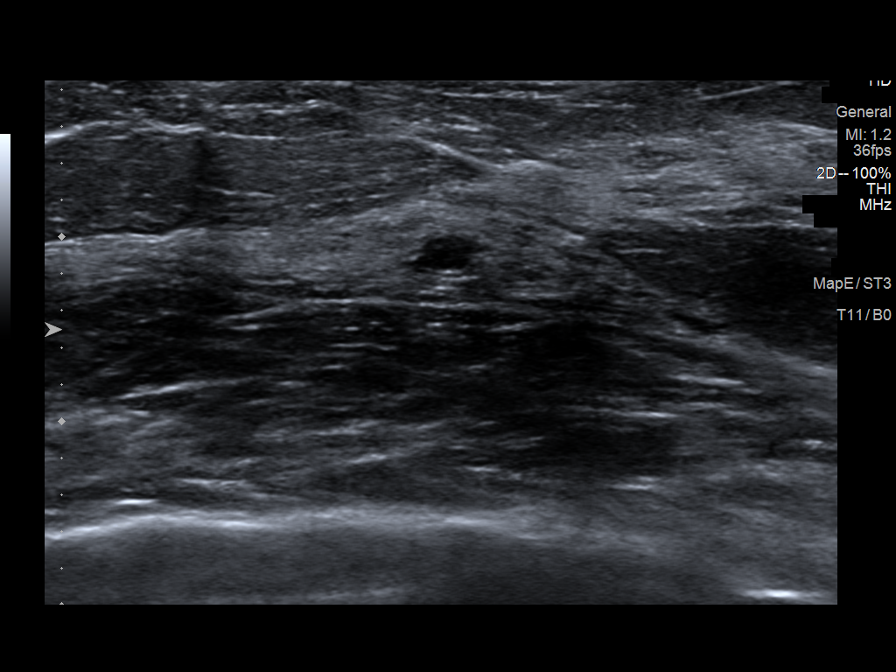
[im 2/9]
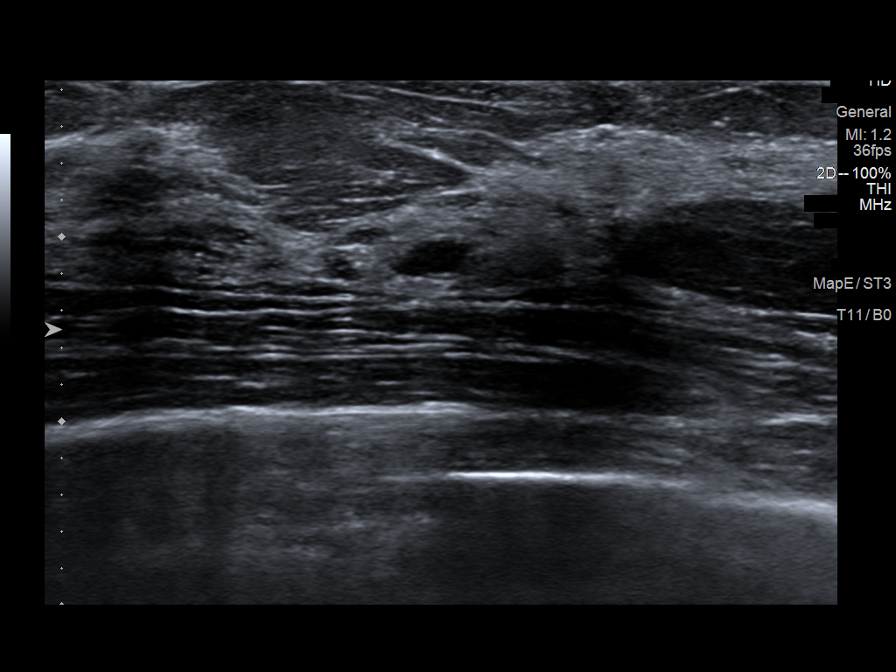
[im 3/9]
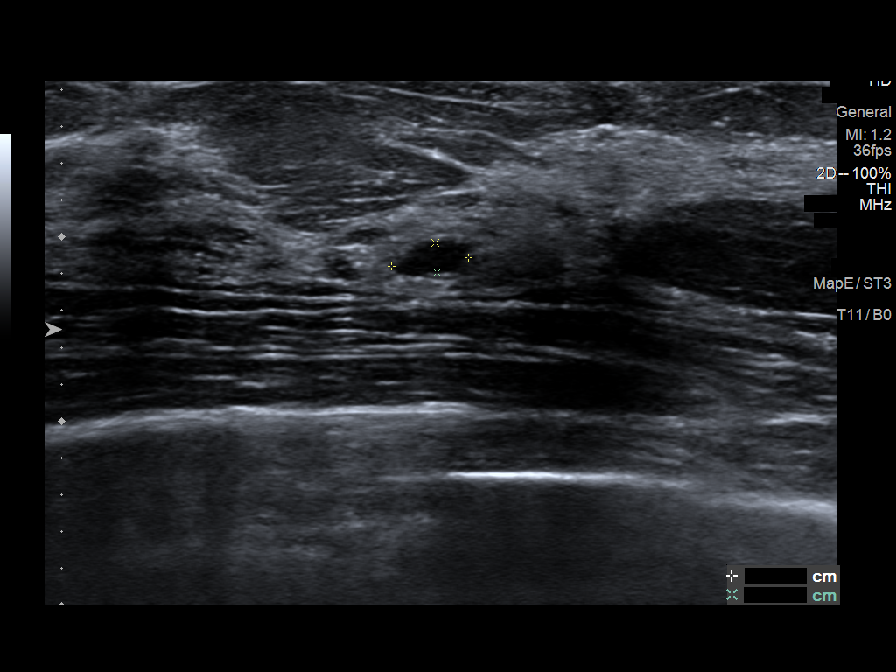
[im 4/9]
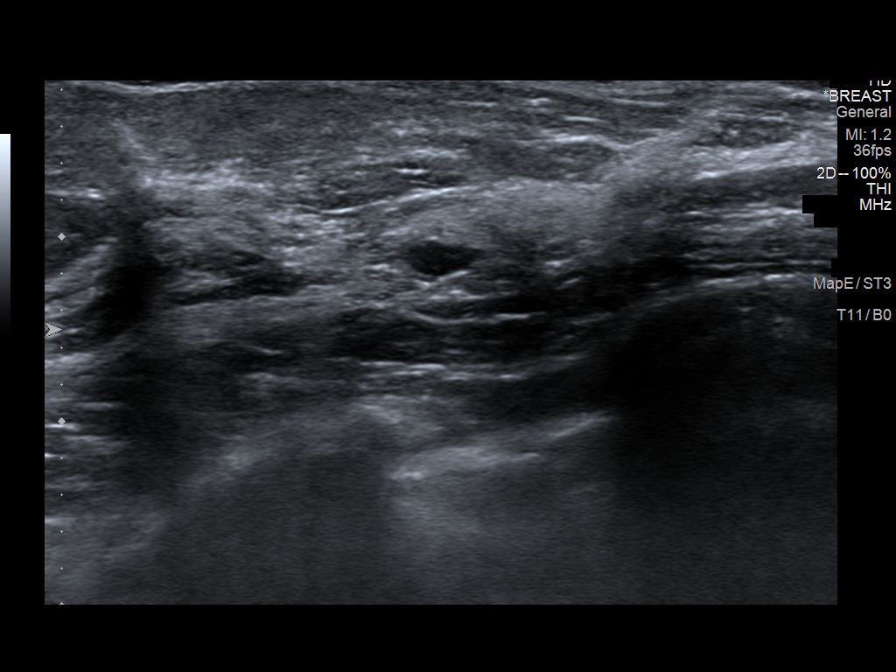
[im 5/9]
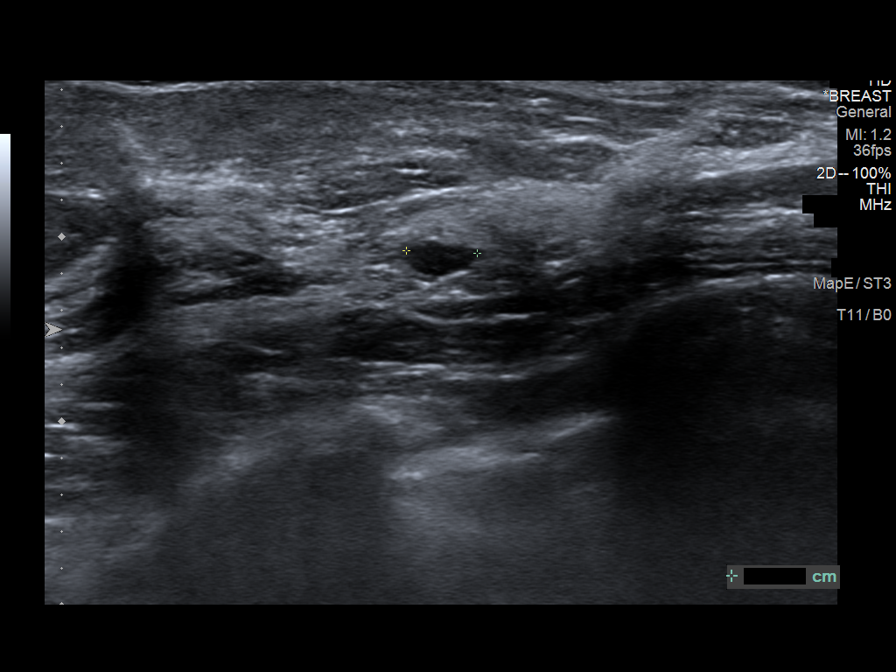
[im 6/9]
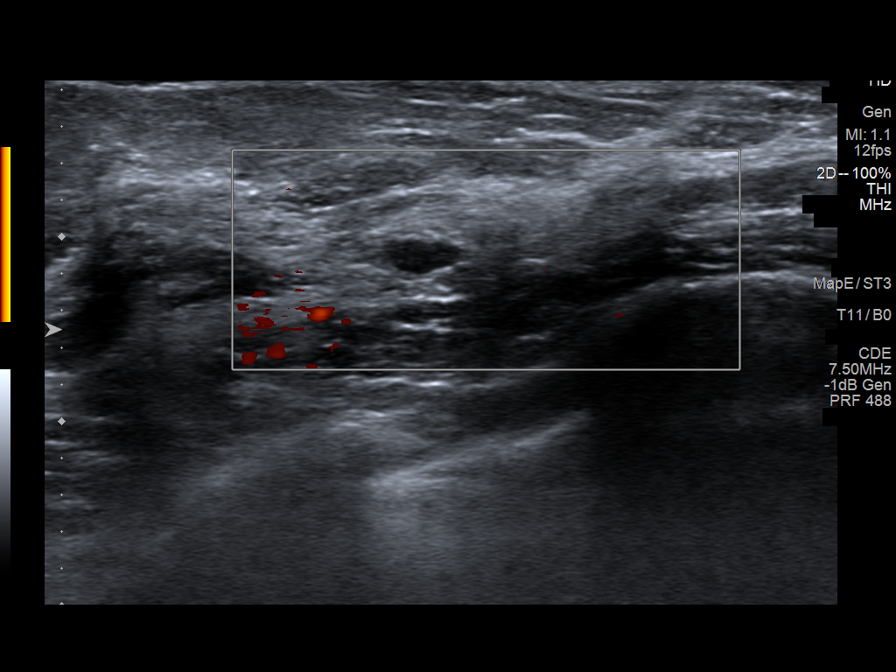
[im 7/9]
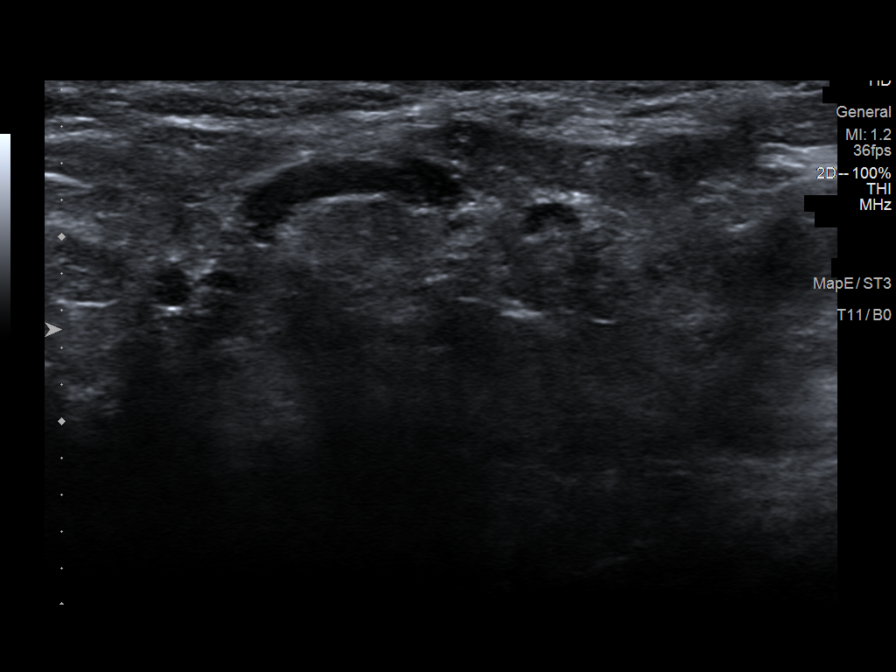
[im 8/9]
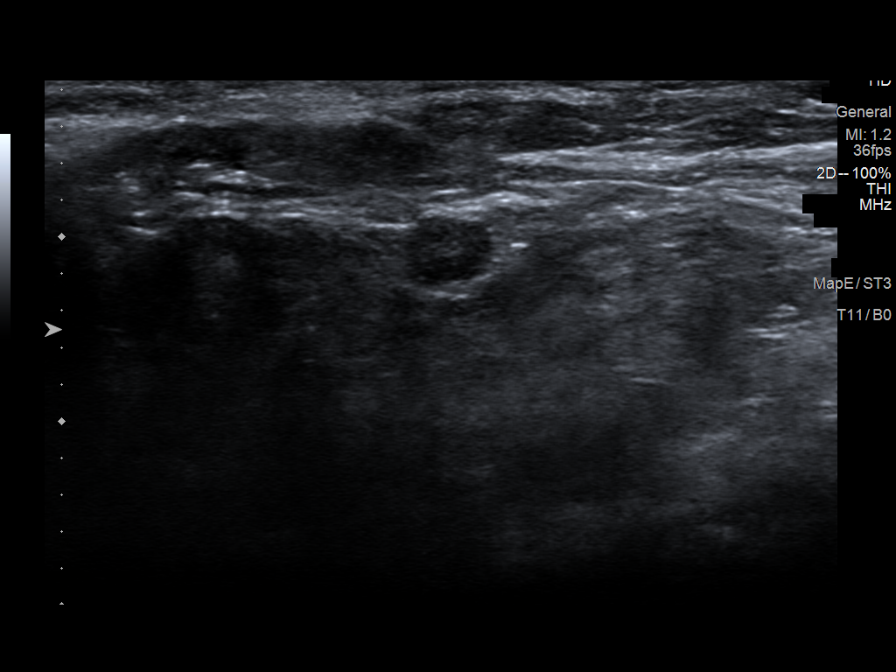
[im 9/9]
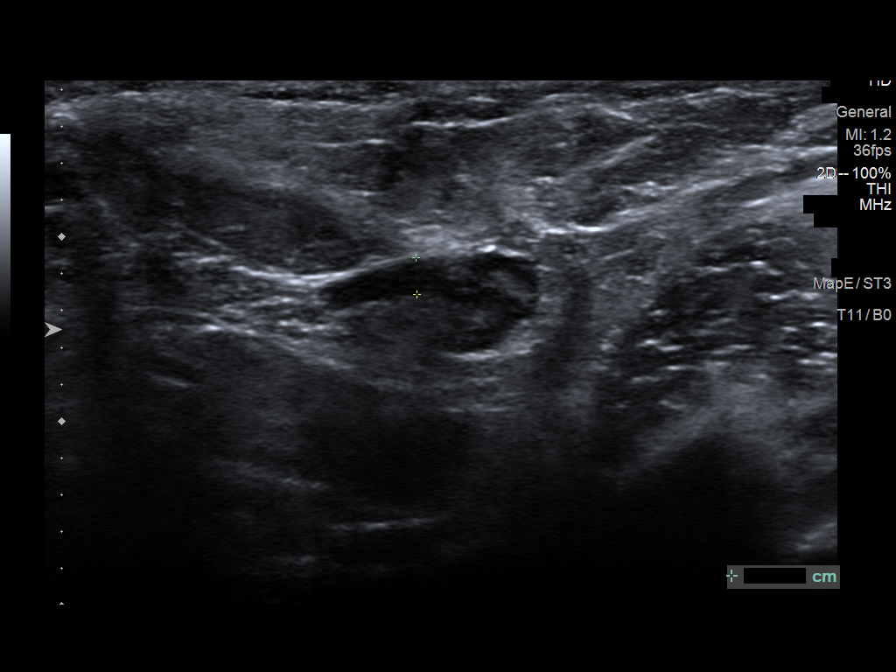

[9 of 9 positions shown; findings below may reference images not displayed]

ACR Breast Density Category c: The breast tissue is heterogeneously
dense, which may obscure small masses.
FINDINGS: Additional imaging of the right breast was performed showing
persistence of a 5 mm circumscribed mass in the upper-outer quadrant
of the right breast. There are no malignant type
microcalcifications.

Mammographic images were processed with CAD.

On physical exam, I do not palpate a mass in the upper-outer
quadrant of the right breast.

Targeted ultrasound is performed, showing an anechoic cyst with
increased through transmission in the right breast at [DATE] 7 cm from
the nipple measuring 4 x 2 x 4 mm. No solid mass is identified.
IMPRESSION: Right breast cyst.  No evidence of malignancy in the right breast.

RECOMMENDATION:
Unilateral right screening mammogram in 1 year is recommended.

I have discussed the findings and recommendations with the patient.
If applicable, a reminder letter will be sent to the patient
regarding the next appointment.

BI-RADS CATEGORY  2: Benign.

## 2021-06-01 IMAGING — MG MM DIGITAL DIAGNOSTIC UNILAT*R* W/ TOMO W/ CAD
4 series · 4 of 12 positions shown · non-contrast
Comparison: Previous exam(s).

CLINICAL DATA: Patient was recalled from 2D screening mammogram for
a right breast mass.

EXAM:
DIGITAL DIAGNOSTIC RIGHT MAMMOGRAM WITH CAD AND TOMO
ULTRASOUND RIGHT BREAST

[R MLO synth-2D]
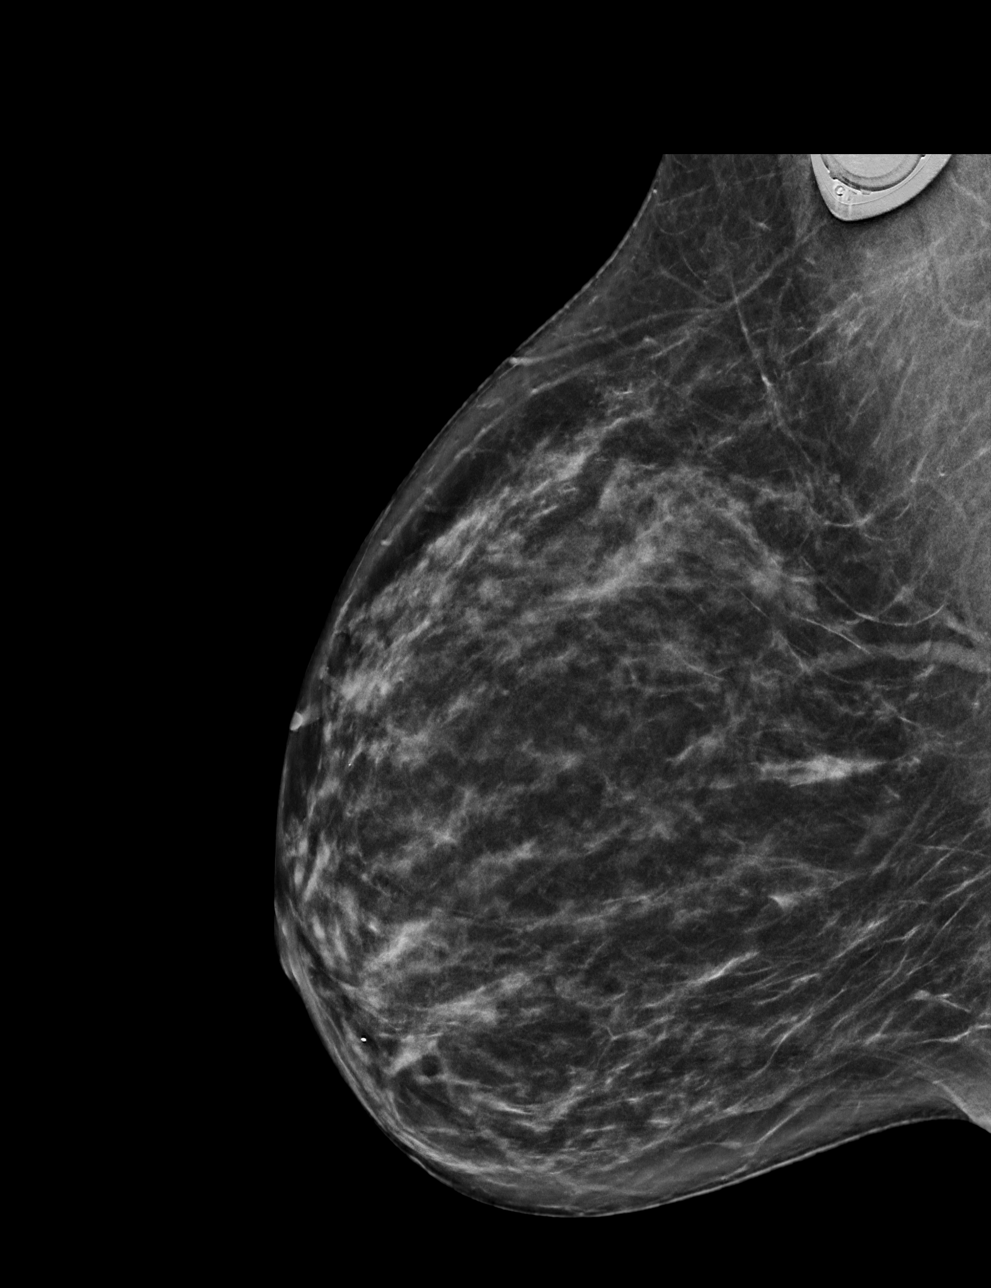

[R CC synth-2D]
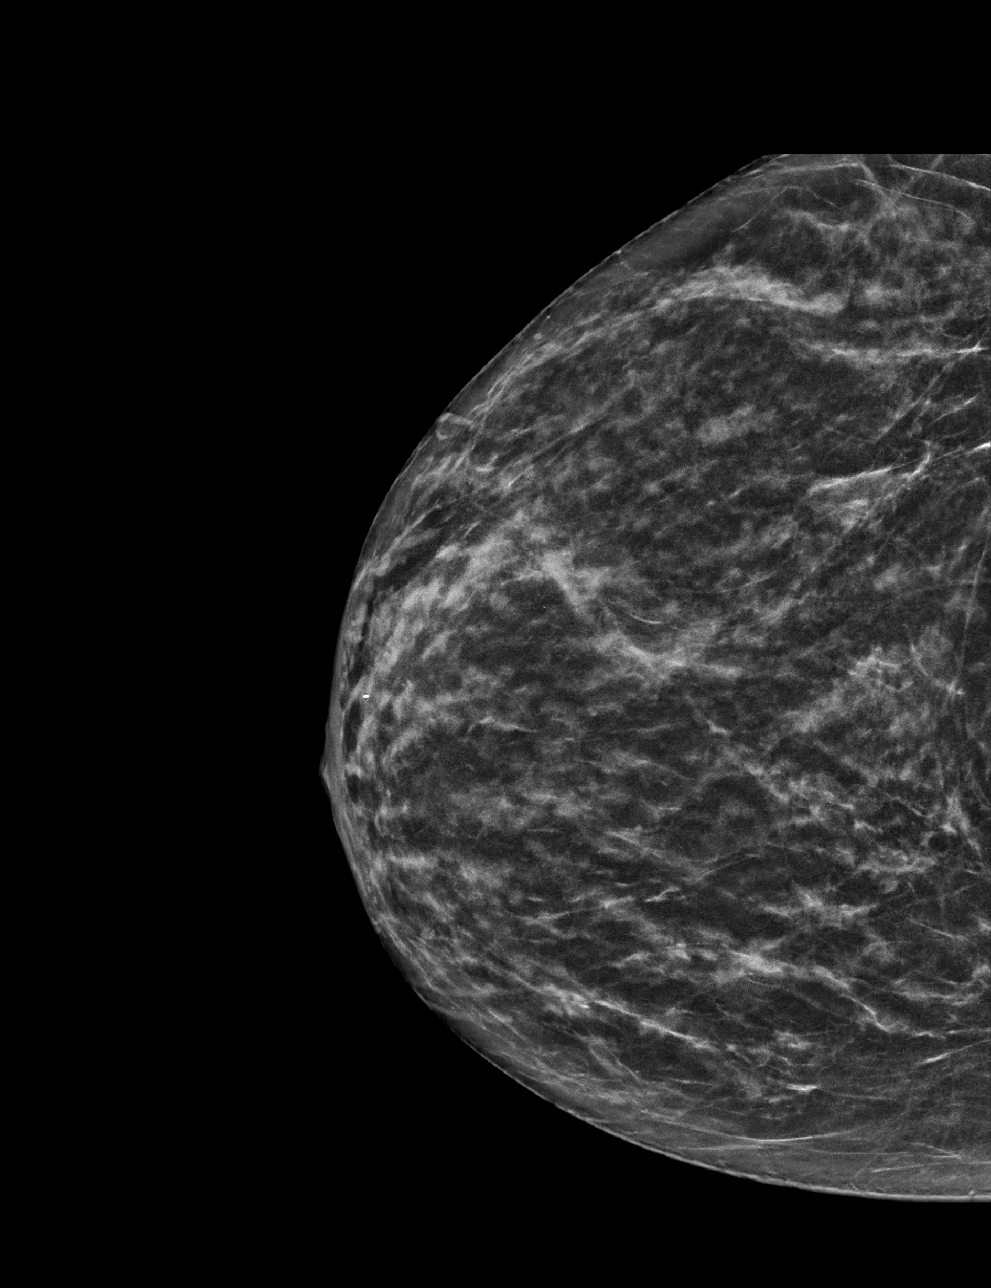

[R CC tomo · tomo slice 26/51.0]
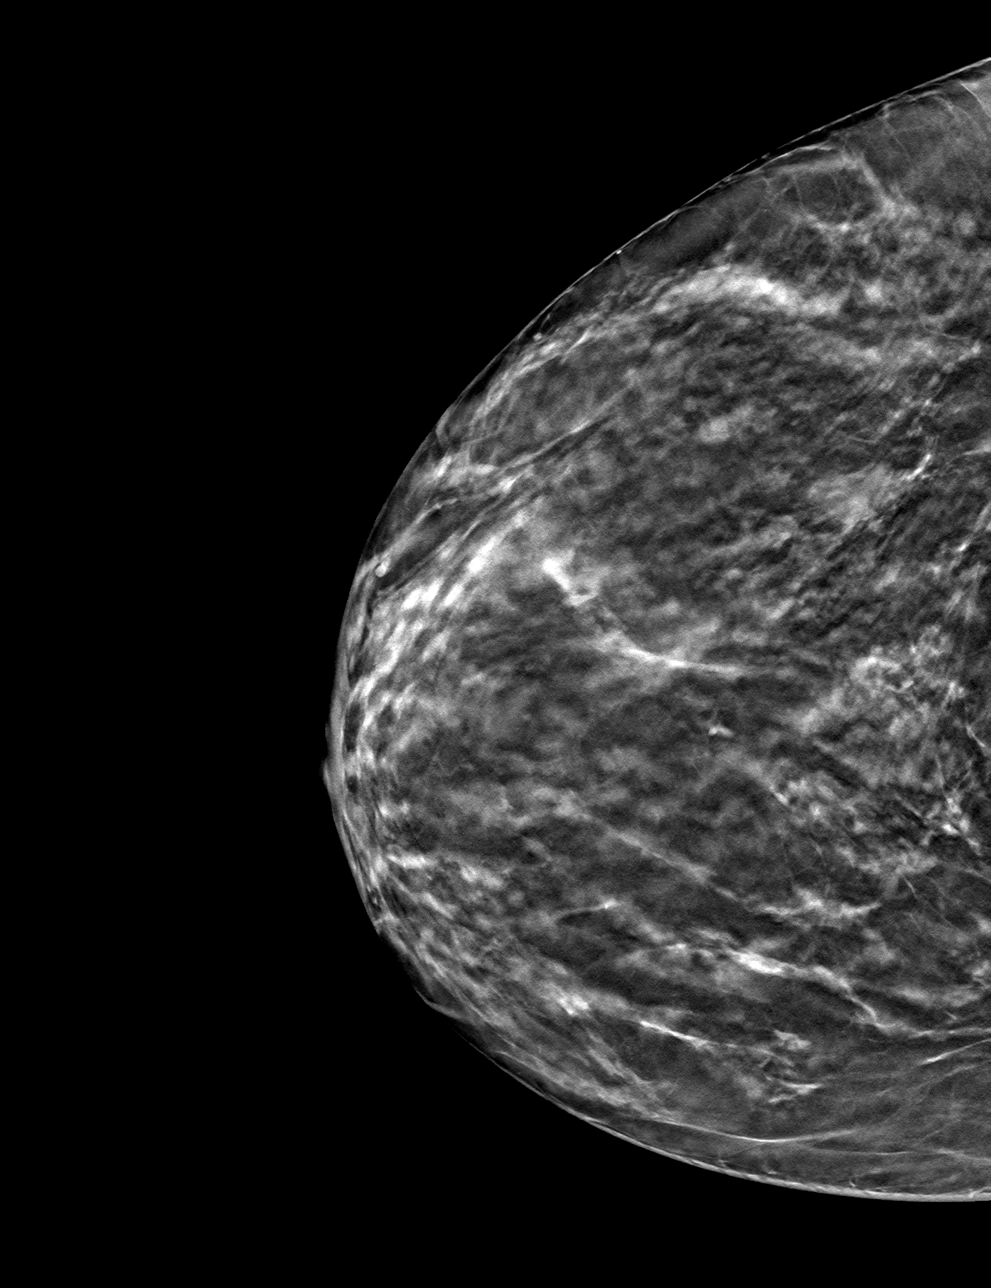

[R MLO tomo · tomo slice 31/62.0]
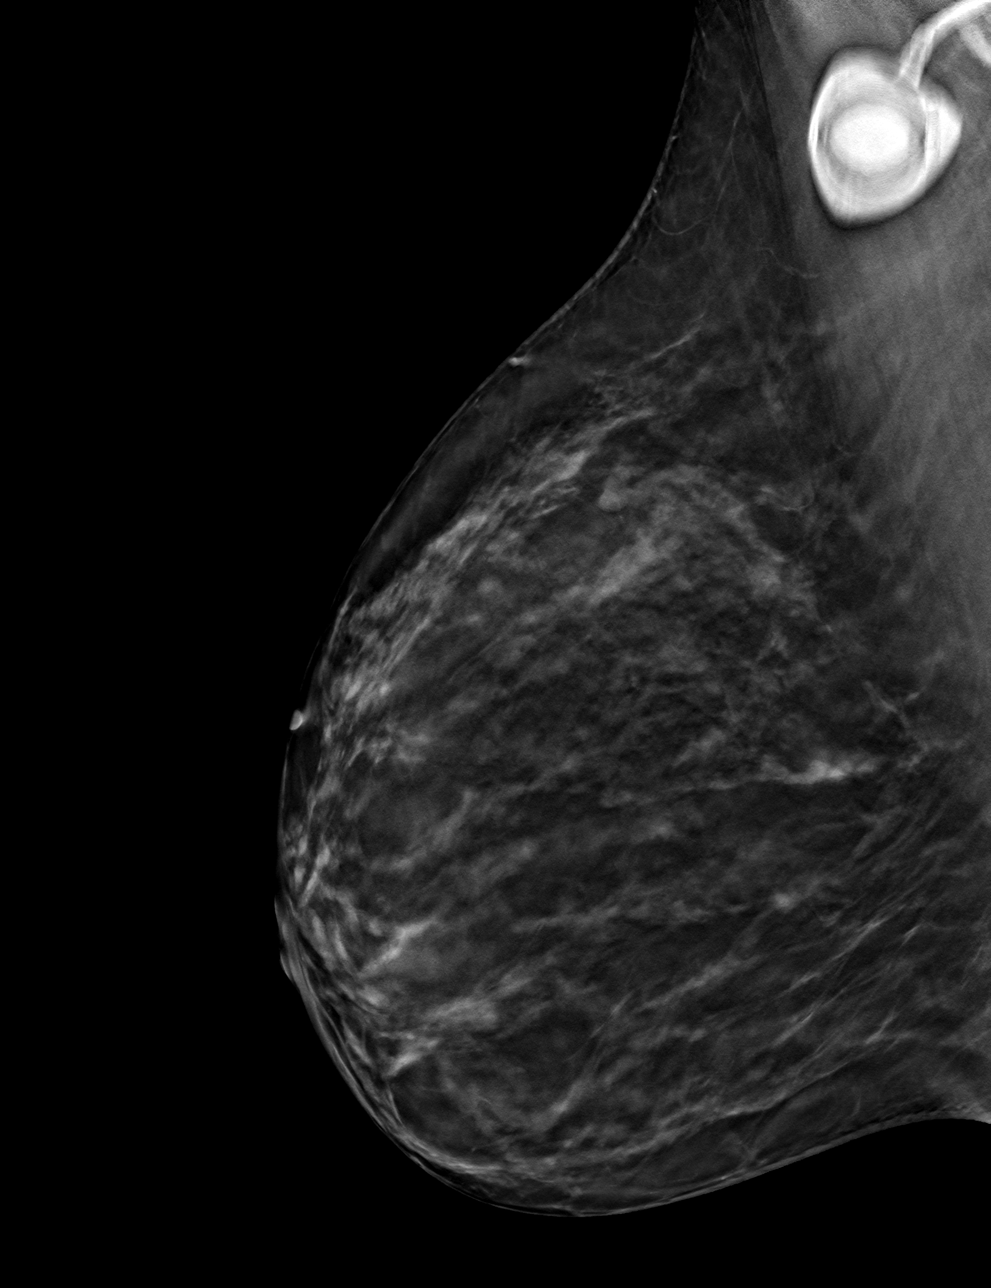

[4 of 12 positions shown; findings below may reference images not displayed]

ACR Breast Density Category c: The breast tissue is heterogeneously
dense, which may obscure small masses.
FINDINGS: Additional imaging of the right breast was performed showing
persistence of a 5 mm circumscribed mass in the upper-outer quadrant
of the right breast. There are no malignant type
microcalcifications.

Mammographic images were processed with CAD.

On physical exam, I do not palpate a mass in the upper-outer
quadrant of the right breast.

Targeted ultrasound is performed, showing an anechoic cyst with
increased through transmission in the right breast at [DATE] 7 cm from
the nipple measuring 4 x 2 x 4 mm. No solid mass is identified.
IMPRESSION: Right breast cyst.  No evidence of malignancy in the right breast.

RECOMMENDATION:
Unilateral right screening mammogram in 1 year is recommended.

I have discussed the findings and recommendations with the patient.
If applicable, a reminder letter will be sent to the patient
regarding the next appointment.

BI-RADS CATEGORY  2: Benign.

## 2021-06-12 ENCOUNTER — Ambulatory Visit: Payer: Medicaid Other

## 2021-06-13 ENCOUNTER — Ambulatory Visit
Admission: RE | Admit: 2021-06-13 | Discharge: 2021-06-13 | Disposition: A | Discharge status: Home / Self Care | Payer: Medicaid Other | Source: Ambulatory Visit | Attending: Hematology | Admitting: Hematology

## 2021-06-13 DIAGNOSIS — Z1231 Encounter for screening mammogram for malignant neoplasm of breast: Secondary | ICD-10-CM

## 2021-06-15 ENCOUNTER — Other Ambulatory Visit: Payer: Self-pay | Admitting: Hematology

## 2021-06-15 ENCOUNTER — Other Ambulatory Visit: Payer: Self-pay

## 2021-06-15 ENCOUNTER — Inpatient Hospital Stay: Payer: Medicaid Other | Attending: Hematology

## 2021-06-15 VITALS — BP 110/75 | HR 93 | Temp 98.6°F | Resp 18

## 2021-06-15 DIAGNOSIS — C50412 Malignant neoplasm of upper-outer quadrant of left female breast: Secondary | ICD-10-CM | POA: Diagnosis present

## 2021-06-15 DIAGNOSIS — Z95828 Presence of other vascular implants and grafts: Secondary | ICD-10-CM

## 2021-06-15 DIAGNOSIS — Z5111 Encounter for antineoplastic chemotherapy: Secondary | ICD-10-CM | POA: Diagnosis present

## 2021-06-15 MED ORDER — GOSERELIN ACETATE 3.6 MG ~~LOC~~ IMPL
3.6000 mg | DRUG_IMPLANT | Freq: Once | SUBCUTANEOUS | Status: AC
  Administered 2021-06-15: 14:00:00 3.6 mg via SUBCUTANEOUS
  Filled 2021-06-15: qty 3.6

## 2021-06-16 ENCOUNTER — Inpatient Hospital Stay: Payer: Medicaid Other

## 2021-07-08 ENCOUNTER — Other Ambulatory Visit: Payer: Self-pay | Admitting: Hematology

## 2021-07-14 ENCOUNTER — Inpatient Hospital Stay: Payer: Medicaid Other | Attending: Hematology

## 2021-07-14 ENCOUNTER — Other Ambulatory Visit: Payer: Self-pay

## 2021-07-14 VITALS — BP 124/85 | HR 103 | Temp 98.1°F | Resp 16

## 2021-07-14 DIAGNOSIS — C50412 Malignant neoplasm of upper-outer quadrant of left female breast: Secondary | ICD-10-CM | POA: Diagnosis present

## 2021-07-14 DIAGNOSIS — Z5111 Encounter for antineoplastic chemotherapy: Secondary | ICD-10-CM | POA: Insufficient documentation

## 2021-07-14 DIAGNOSIS — Z79899 Other long term (current) drug therapy: Secondary | ICD-10-CM | POA: Insufficient documentation

## 2021-07-14 DIAGNOSIS — Z95828 Presence of other vascular implants and grafts: Secondary | ICD-10-CM

## 2021-07-14 MED ORDER — GOSERELIN ACETATE 3.6 MG ~~LOC~~ IMPL
3.6000 mg | DRUG_IMPLANT | Freq: Once | SUBCUTANEOUS | Status: AC
  Administered 2021-07-14: 3.6 mg via SUBCUTANEOUS
  Filled 2021-07-14: qty 3.6

## 2021-08-09 NOTE — Progress Notes (Signed)
[facility] OFFICE PROGRESS NOTE  Victoria Barker, [place] [address]  DIAGNOSIS:  f/u of left breast cancer  Oncology History Overview Note  Cancer Staging Malignant neoplasm of upper-outer quadrant of left breast in female, estrogen receptor positive ([facility]) Staging form: Breast, AJCC 8th Edition - Clinical stage from 04/28/2019: Stage IIA (cT2, cN0, cM0, G2, ER+, PR-, HER2: Equivocal) - Signed by Truitt Merle, MD on 05/04/2019 - Pathologic stage from 07/13/2019: Stage IIA (pT2, pN0, cM0, G3, ER+, PR-, HER2+, Oncotype DX score: 56) - Signed by Truitt Merle, MD on 08/20/2019    Malignant neoplasm of upper-outer quadrant of left breast in female, estrogen receptor positive ([facility])  04/21/2019 Mammogram   Diagnostic mammogram 04/21/19  IMPRESSION 1. findings highly suspicious for left breast cancer. There is a palpable irregular mass 4cm from nipple measuring 2.4x2.4x1.8cm in the 1:00 position axis of the left breast and pleomorphic  calcifications in the upper-outer quadrant as described above.  2. single left 0.9x0.5cm axillary lymph node with a mild/borderline thickened cortex.    04/28/2019 Cancer Staging   Staging form: Breast, AJCC 8th Edition - Clinical stage from 04/28/2019: Stage IIA (cT2, cN0, cM0, G2, ER+, PR-, HER2: Equivocal) - Signed by Truitt Merle, MD on 05/04/2019    04/28/2019 Initial Biopsy   Diagnosis 04/28/19 1. Breast, left, needle core biopsy, 1 o'clock position - INVASIVE DUCTAL CARCINOMA. - DUCTAL CARCINOMA IN SITU. - SEE COMMENT. 2. Lymph node, needle/core biopsy, left axilla - THERE IS NO EVIDENCE OF CARCINOMA IN 1 OF 1 LYMPH NODE (0/1). 3. Breast, left, needle core biopsy, upper, outer quadrant - DUCTAL CARCINOMA IN SITU WITH CALCIFICATIONS. - SEE COMMENT.   04/28/2019 Receptors her2   1. PROGNOSTIC INDICATORS Results: HER2 negative  Estrogen Receptor: 80%, POSITIVE, STRONG STAINING INTENSITY Progesterone Receptor: 0%,  NEGATIVE Proliferation Marker Ki67: 35%    04/30/2019 Initial Diagnosis   Malignant neoplasm of upper-outer quadrant of left breast in female, estrogen receptor positive ([name])    Genetic Testing   No pathogenic variants identified. 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 The final report date is 05/28/2019.  The STAT Breast cancer panel offered by [facility] includes sequencing and rearrangement analysis for the following 9 genes:  ATM, BRCA1, BRCA2, CDH1, CHEK2, PALB2, PTEN, STK11 and TP53.    The Common Hereditary Cancers Panel offered by [facility] includes sequencing and/or deletion duplication testing of the following 48 genes: APC, ATM, AXIN2, BARD1, BMPR1A, BRCA1, BRCA2, BRIP1, CDH1, CDKN2A (p14ARF), CDKN2A (p16INK4a), CKD4, CHEK2, CTNNA1, DICER1, EPCAM (Deletion/duplication testing only), GREM1 (promoter region deletion/duplication testing only), KIT, MEN1, MLH1, MSH2, MSH3, MSH6, MUTYH, NBN, NF1, NHTL1, PALB2, PDGFRA, PMS2, POLD1, POLE, PTEN, RAD50, RAD51C, RAD51D, RNF43, SDHB, SDHC, SDHD, SMAD4, SMARCA4. STK11, TP53, TSC1, TSC2, and VHL.  The following genes were evaluated for sequence changes only: SDHA and HOXB13 c.251G>A variant only.   05/11/2019 Breast MRI   IMPRESSION: 1. Enhancing mass and architectural distortion spanning approximately 6 x 3 x 5.3 cm in the upper outer left breast consistent with the patient's biopsy-proven site of invasive cancer. 2. Biopsy changes in the anterior left breast at the site of the patient's biopsy-proven ductal carcinoma in situ. 3. Suspicious changes within the upper-outer quadrant of the left breast spanning a total of 8 cm in the AP dimension. This includes the site of biopsy-proven DCIS anteriorly and suspicious enhancement extending to the level of the pectoralis muscle  posteriorly. 4. Suspicious 7 mm enhancing mass in the far posterior slightly lateral left breast  (image 138/212). 5. No MRI evidence of malignancy on the right. 6. No suspicious lymphadenopathy.     05/26/2019 -  Anti-estrogen oral therapy   Tamoxifen 67m daily starting 05/26/19. Stopped before surgery and chemo on 07/13/19. Restart in 12/2019. Due to insomnia and nausea, reduced dose to 10 in 05/2020.  ---------Started her on Ovarian suppression Zoladex on 06/15/20.  ----------Switched to Letrozole in 07/2020.    07/13/2019 Surgery   LEFT NIPPLE SPARING MASTECTOMY WITH SENTINEL LYMPH NODE BIOPSY and LEFT BREAST RECONSTRUCTION WITH PLACEMENT OF TISSUE EXPANDER AND ALLODERM by D.r Tsuie and Dr TIran Planas    07/13/2019 Pathology Results   SURGICAL PATHOLOGY   FINAL MICROSCOPIC DIAGNOSIS:   A. LYMPH NODE, LEFT AXILLARY #1, SENTINEL, BIOPSY:  - One of one lymph nodes negative for carcinoma (0/1).   B. BREAST, LEFT, MASTECTOMY:  - Invasive ductal carcinoma, grade 3, spanning 3.4 cm.  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  - Invasive carcinoma is 0.2 cm from the posterior margin focally (not on  ink) and 0.4 cm from the  anterior margin focally (not on ink).  - In situ carcinoma is 0.4 cm from the posterior margin focally (not on  ink).  - Biopsy site x2.  - See oncology table.   C. NIPPLE, LEFT, BIOPSY:  - Benign nipple tissue.   D. LYMPH NODE, LEFT AXILLARY #2, SENTINEL, BIOPSY:  - One of one lymph nodes negative for carcinoma (0/1).     GROUP 1:  HER2 **POSITIVE**    07/13/2019 Oncotype testing   Ocotype score 56 with 39% risk of recurrence with Tmaoxifen or AI alone. There is a 15% benefit from adjuvant chemo.     07/13/2019 Cancer Staging   Staging form: Breast, AJCC 8th Edition - Pathologic stage from 07/13/2019: Stage IIA (pT2, pN0, cM0, G3, ER+, PR-, HER2+, Oncotype DX score: 56) - Signed by FTruitt Merle MD on 08/20/2019    08/25/2019 Echocardiogram   Baseline Echo  IMPRESSIONS     1. Left ventricular ejection fraction, by estimation, is 65 to 70%.  The  left ventricle has normal function. The left ventricle has no regional  wall motion abnormalities. Left ventricular diastolic parameters were  normal.   2. Right ventricular systolic function is normal. The right ventricular  size is normal. There is normal pulmonary artery systolic pressure.   3. Left atrial size was mildly dilated.   4. The mitral valve is normal in structure and function. Trivial mitral  valve regurgitation.   5. The aortic valve is tricuspid. Aortic valve regurgitation is not  visualized.   6. The inferior vena cava is normal in size with greater than 50%  respiratory variability, suggesting right atrial pressure of 3 mmHg.   7. The aortic valve is not well seen. It is trileaflet. There appears to  be some calcification associated with the non-coronary cusp but this is  not well seen.    08/25/2019 Imaging   Whole body Bone Scan  IMPRESSION: No significant abnormality identified.   08/25/2019 Imaging   CT CAP w Contrast  IMPRESSION: 1. No findings of metastatic disease to the chest, abdomen, or pelvis. 2. 0.7 by 0.3 cm sclerotic lesion in the right seventh rib laterally is likely benign but merits surveillance. 3. Mild sclerosis along the sacroiliac joints bilaterally, query mild chronic sacroiliitis.     09/01/2019 Procedure   INSERTION PORT-A-CATH WITH ULTRASOUND  GUIDANCE by Dr. Georgette Dover   09/02/2019 -  Chemotherapy   TCH (Taxol, Carboplatin, Herceptin) q3weeks for 6 cycles starting 09/02/19-12/16/19, followed by anti-HER2 maintenance therapy with Herceptin q3weeks starting 01/06/20 to complete 1 year of treatment (from 08/2019)     05/05/2020 Surgery   REMOVAL OF LEFT CHEST TISSUE EXPANDER AND PLACEMENT OF IMPLANT and RIGHT BREAST MASTOPEXY and LIPOFILLING FROM ABDOMEN TO RIGHT CHEST by Dr Iran Planas   11/09/2020 - 12/19/2020 Chemotherapy   Neratinib, stopped due to severe nausea/vomiting    Invasive ductal carcinoma of breast, female, left ([facility])   07/13/2019 Initial Diagnosis   Invasive ductal carcinoma of breast, female, left ([facility])   11/09/2020 - 12/19/2020 Chemotherapy   Neratinib, stopped due to severe nausea/vomiting      CURRENT THERAPY: -Tamoxifen 22m daily starting 05/26/19. Stopped before surgery and chemo on 07/13/19. Restart in 12/2019. Due to insomnia and nausea, reduced dose to 10 in 05/2020. Switched to Letrozole in 07/2020. -Started her on Ovarian suppression Zoladex on 06/15/20.  INTERVAL HISTORY: Victoria Mixon46y.o. female returns to the clinic today for a 455-monthollow-up visit.  The patient was last seen in the clinic on 04/19/2021.  The patient is currently undergoing treatment with letrozole 1 tablet p.o. daily as well as Zoladex every 4 weeks.  Regarding the letrozole, the patient tolerates this fairly well except for some hot flashes which are tolerable.  She reports she stopped taking Effexor because her hot flashes are manageable if she takes her letrozole at nighttime.  The patient is up-to-date on her mammogram which was performed on 06/13/2021.  This did not show any evidence of disease recurrence.   The patient's last dose of Zoladex was on 07/14/21.  She was going to follow-up with her OB/GYN to discuss possible BSO.  She saw her GYN Dr. GaAdah Perln 07/16/2021.  The patient mentions today that she does not want to undergo any further surgeries and she would rather receive Zoladex injections.  The patient has not had a menstrual cycle since starting Zoladex but she reports she every once in a while has pelvic cramping.  Dr. FeErnestina Pennaast note mention that she would like to arrange for repeat CT scan the chest to follow-up on a right rib lesion.  A CT scan was ordered but not scheduled at this time.  The patient states that she changed insurance at that time.  She is going to call her insurance company to see if its been approved.  Otherwise the patient denies any new concerning complaints today.  She denies any  fever, chills,  unexplained weight loss.  She denies any new bone pain.  She denies any recent signs and symptoms of infection.  She denies any new breast masses, overlying skin changes, dimpling of the skin, nipple changes or discharge.  She is here today for evaluation and repeat blood work.     MEDICAL HISTORY: Past Medical History:  Diagnosis Date   [facility]ast [facility] ([facility]   Left Mastectomy   [place]cer ([facility]   Left breast Ca   GERD (gastroesophageal reflux disease)    Personal history of chemotherapy 08/2019-11/2019   Scoliosis    Sickle cell trait ([name])     ALLERGIES:  is allergic to bactrim [sulfamethoxazole-trimethoprim].  MEDICATIONS:  Current Outpatient Medications  Medication Sig Dispense Refill   dexlansoprazole (DEXILANT) 60 MG capsule Take 1 capsule by mouth daily.     Lactobacillus-Inulin (CULTURELLE DIGESTIVE HEALTH PO) Take 1 capsule by mouth daily as needed (digestive  health.).      lidocaine-prilocaine (EMLA) cream APPLY 1 APPLICATION TOPICALLY AS NEEDED. 30 g 0   letrozole (FEMARA) 2.5 MG tablet Take 1 tablet (2.5 mg total) by mouth daily. 90 tablet 1   venlafaxine XR (EFFEXOR-XR) 75 MG 24 hr capsule TAKE 1 CAPSULE BY MOUTH DAILY WITH BREAKFAST. (Patient not taking: Reported on 08/10/2021) 90 capsule 2   No current facility-administered medications for this visit.    SURGICAL HISTORY:  Past Surgical History:  Procedure Laterality Date   BREAST IMPLANT EXCHANGE Left 09/26/2020   Procedure: REVISION LEFT BREAST RECONSTRUCTION WITH SILICONE IMPLANT EXCHANGE;  Surgeon: Irene Limbo, MD;  Location: [facility];  Service: Plastics;  Laterality: Left;   BREAST RECONSTRUCTION WITH PLACEMENT OF TISSUE EXPANDER AND ALLODERM Left 07/13/2019   Procedure: LEFT BREAST RECONSTRUCTION WITH PLACEMENT OF TISSUE EXPANDER AND ALLODERM;  Surgeon: Irene Limbo, MD;  Location: [facility];  Service: Plastics;  Laterality: Left;   BREAST REDUCTION SURGERY Right 09/26/2020    Procedure: REVISION RIGHT BREAST REDUCTION;  Surgeon: Irene Limbo, MD;  Location: [facility];  Service: Plastics;  Laterality: Right;   DEBRIDEMENT AND CLOSURE WOUND Left 07/30/2019   Procedure: DEBRIDEMENT LEFT MASTECTOMY FLAP;  Surgeon: Irene Limbo, MD;  Location: [facility];  Service: Plastics;  Laterality: Left;   INDUCED ABORTION  2017   LIPOSUCTION WITH LIPOFILLING Right 05/05/2020   Procedure: LIPOFILLING FROM ABDOMEN TO RIGHT CHEST;  Surgeon: Irene Limbo, MD;  Location: [facility];  Service: Plastics;  Laterality: Right;   MASTECTOMY Left 07/13/2019   MASTOPEXY Right 05/05/2020   Procedure: RIGHT BREAST MASTOPEXY;  Surgeon: Irene Limbo, MD;  Location: [facility];  Service: Plastics;  Laterality: Right;   NIPPLE SPARING MASTECTOMY WITH SENTINEL LYMPH NODE BIOPSY Left 07/13/2019   Procedure: LEFT NIPPLE SPARING MASTECTOMY WITH SENTINEL LYMPH NODE BIOPSY;  Surgeon: Donnie Mesa, MD;  Location: [facility];  Service: General;  Laterality: Left;   PORT-A-CATH REMOVAL Right 09/26/2020   Procedure: REMOVAL RIGHT CHEST PORT;  Surgeon: Irene Limbo, MD;  Location: [facility];  Service: Plastics;  Laterality: Right;   PORTACATH PLACEMENT Right 09/01/2019   Procedure: INSERTION PORT-A-CATH WITH ULTRASOUND GUIDANCE;  Surgeon: Donnie Mesa, MD;  Location: [facility];  Service: General;  Laterality: Right;   REMOVAL OF TISSUE EXPANDER AND PLACEMENT OF IMPLANT Left 05/05/2020   Procedure: REMOVAL OF LEFT CHEST TISSUE EXPANDER AND PLACEMENT OF IMPLANT;  Surgeon: Irene Limbo, MD;  Location: [facility];  Service: Plastics;  Laterality: Left;   TISSUE EXPANDER PLACEMENT Left 07/30/2019   Procedure: TISSUE EXPANSION LEFT CHEST;  Surgeon: Irene Limbo, MD;  Location: [facility];  Service: Plastics;  Laterality: Left;    REVIEW OF SYSTEMS:   Review of  Systems  Constitutional:  Positive for hot flashes at night (manageable) Negative for appetite change, chills, fatigue, fever and unexpected weight change.  HENT: Negative for mouth sores, nosebleeds, sore throat and trouble swallowing.   Eyes: Negative for eye problems and icterus.  Respiratory: Negative for cough, hemoptysis, shortness of breath and wheezing.   Cardiovascular: Negative for chest pain and leg swelling.  Gastrointestinal: Negative for abdominal pain, constipation, diarrhea, nausea and vomiting.  Genitourinary: Negative for bladder incontinence, difficulty urinating, dysuria, frequency and hematuria.   Musculoskeletal: Negative for back pain, gait problem, neck pain and neck stiffness.  Skin: Negative for itching and rash.  Neurological: Negative for dizziness, extremity weakness, gait problem, headaches,  light-headedness and seizures.  Hematological: Negative for adenopathy. Does not bruise/bleed easily.  Psychiatric/Behavioral: Negative for confusion, depression and sleep disturbance. The patient is not nervous/anxious.     PHYSICAL EXAMINATION:  Blood pressure 111/81, pulse 88, temperature (!) 97.1 F (36.2 C), temperature source Tympanic, resp. rate 16, height _0  (1.575 m), weight 120 lb 8 oz (54.7 kg), SpO2 100 %.  ECOG PERFORMANCE STATUS: 0-1  Physical Exam  Constitutional: Oriented to person, place, and time and well-developed, well-nourished, and in no distress.  HENT:  Head: Normocephalic and atraumatic.  Mouth/Throat: Oropharynx is clear and moist. No oropharyngeal exudate.  Eyes: Conjunctivae are normal. Right eye exhibits no discharge. Left eye exhibits no discharge. No scleral icterus.  Neck: Normal range of motion. Neck supple.  Cardiovascular: Normal rate, regular rhythm, normal heart sounds and intact distal pulses.   Pulmonary/Chest: Effort normal and breath sounds normal. No respiratory distress. No wheezes. No rales.  Abdominal: Soft. Bowel sounds  are normal. Exhibits no distension and no mass. There is no tenderness.  Musculoskeletal: Normal range of motion. Exhibits no edema.  Lymphadenopathy:    No cervical adenopathy.  Neurological: Alert and oriented to person, place, and time. Exhibits normal muscle tone. Gait normal. Coordination normal.  Skin: Skin is warm and dry. No rash noted. Not diaphoretic. No erythema. No pallor.  Breasts: Breast inspection showed them to be symmetrical with no nipple discharge. She is status post reconstruction in her left breast. Palpation of the breasts and axilla revealed no obvious mass that I could appreciate.  Psychiatric: Mood, memory and judgment normal.  Vitals reviewed.  LABORATORY DATA: Lab Results  Component Value Date   WBC 5.6 08/10/2021   HGB 13.1 08/10/2021   HCT 38.8 08/10/2021   MCV 86.0 08/10/2021   PLT 245 08/10/2021      Chemistry      Component Value Date/Time   NA 139 08/10/2021 1353   K 3.9 08/10/2021 1353   CL 104 08/10/2021 1353   CO2 31 08/10/2021 1353   BUN 11 08/10/2021 1353   CREATININE 0.80 08/10/2021 1353      Component Value Date/Time   CALCIUM 9.5 08/10/2021 1353   ALKPHOS 74 08/10/2021 1353   AST 15 08/10/2021 1353   ALT 9 08/10/2021 1353   BILITOT 0.4 08/10/2021 1353       RADIOGRAPHIC STUDIES:  No results found.   ASSESSMENT/PLAN:  Victoria Barker is a 41 y.o. female with    1. Malignant neoplasm of upper-outer quadrant of left breast, invasive ductal carcinoma and DCIS, stage IIA, pT2N0M0, ER+/PR-/HER2+, Grade III -She was diagnosed in 04/2019. She was found to have 2 left breast masses with DCIS and 1 mass with Invasive ductal carcinoma, her abnormal lymph node was biopsied and found to be negative. -On 07/13/19 she underwent left breast mastectomy with Dr. Prince Solian and reconstruction with Dr. Iran Planas. Tumor was found to be HER2 positive on resected sample and Oncotype testing (ordered before HER2 came back positive) with recurrence score of  56.  -To reduce her high risk of recurrence she completed 6 cycles of adjuvant chemotherapy with TCH (09/02/19-12/16/19). She completed one year trastuzumab therapy in 08/2020 -Given node negative disease and mastectomy, Dr Isidore Moos did not recommend adjuvant radiation.  -She restarted Tamoxifen in 12/2019. Due to insomnia and intermittent nausea she can reduce to 57m but could not tolerate and stopped in early Jan 2022. Dr. FBurr Medicoswitched her to Letrozole in 07/2020, she is tolerating better, hot flashes manageable. -She  started ovarian suppression Zoladex injections on 06/15/20, will continue every 4 weeks until her BSO.  The patient does not want to undergo any further surgeries and she would prefer to continue on Zoladex injections. -She began neratinib on 11/09/20.  She has had mild intermittent nausea, and epigastric discomfort on low-dose 170m daily. This worsened and caused vomiting following cycle 2. Accordingly, Dr. FBurr Medicostopped the medication 12/19/20.  She has recovered well -She will be due for routine mammography in 04/2021. She had this performed on 08/14/20. Given her young age of onset for breast cancer, she qualifies for annual breast screening MRI. I reviewed this with her. She would like to proceed with this.  I will stagger this to be in June 2023 which would be 6 months prior to her next screening mammogram in December 2023.  -Continue letrozole and Zoladex injection every 4 weeks -She will f/u in 4 months.   2. Hot Flashes -She has had worsened hot flashes day and night which are effecting her sleep.  -Dr. FBurr Medicostarted her on Effexor in 12/2019. Her hot flashes have improved but are still present. Dose increased to 75 mg on 11/02/20.  -The patient discontinued Effexor due to her hot flashes being manageable with taking her medication at nighttime.   3. Genetic testing negative for pathogenetic mutations.    4. Right 7th rib lesion -She was found to have a 0.7 x 0.3 cm sclerotic lesion  in the right seventh rib on CT scan in March 2021, no prior injury or rib fracture -She is asymptomatic -At the patient's last appointment, Dr. FBurr Medicoordered a CT scan of the chest to follow-up on this.  This has not been scheduled due to the patient's insurance changing.  The patient was given the number to radiology scheduling advised to call and schedule the scan.  I have also reached out to the revenue cycle pool to see if her scan has been authorized by insurance at this time.      PLAN:  -Continue letrozole.  Refill sent to pharmacy. -proceed with Zoladex injection today and every 4 weeks -Lab and follow-up in 4 months -CT chest without contrast. Given number to radiology scheduling.  Reached out to imaging authorization team to see if this has been authorized.  -Screening right mammogram in December 2023 -Breast MRI June 2023       Orders Placed This Encounter  Procedures   MM 3D SCREEN BREAST UNI RIGHT    Standing Status:   Future    Standing Expiration Date:   08/10/2022    Order Specific Question:   Reason for Exam (SYMPTOM  OR DIAGNOSIS REQUIRED)    Answer:   Hx ER HER2 positive breast cancer, stage IIa    Order Specific Question:   Is the patient pregnant?    Answer:   No    Order Specific Question:   Preferred imaging location?    Answer:   [facility]   MR BREAST W & WO CM SCREENING (GI)    Standing Status:   Future    Standing Expiration Date:   08/10/2022    Order Specific Question:   If indicated for the ordered procedure, I authorize the administration of contrast media per Radiology protocol    Answer:   Yes    Order Specific Question:   Is the patient pregnant?    Answer:   No    Order Specific Question:   Is the patient's LMP greater than 28 days?  Answer:   No    Order Specific Question:   What is the patient's sedation requirement?    Answer:   No Sedation    Order Specific Question:   Does the patient have a pacemaker or implanted devices?     Answer:   No    Order Specific Question:   Preferred imaging location?    Answer:   [facility] (table limit-550lbs)     The total time spent in the appointment was 20-29 minutes.   Breylen Agyeman L Hilding Quintanar, PA-C 08/10/21

## 2021-08-10 ENCOUNTER — Other Ambulatory Visit: Payer: Self-pay

## 2021-08-10 ENCOUNTER — Inpatient Hospital Stay (HOSPITAL_BASED_OUTPATIENT_CLINIC_OR_DEPARTMENT_OTHER): Payer: Medicaid Other | Admitting: Physician Assistant

## 2021-08-10 ENCOUNTER — Encounter: Payer: Self-pay | Admitting: Physician Assistant

## 2021-08-10 ENCOUNTER — Other Ambulatory Visit: Payer: Medicaid Other

## 2021-08-10 ENCOUNTER — Ambulatory Visit: Payer: Medicaid Other | Admitting: Hematology

## 2021-08-10 ENCOUNTER — Inpatient Hospital Stay: Payer: Medicaid Other

## 2021-08-10 ENCOUNTER — Ambulatory Visit: Payer: Medicaid Other

## 2021-08-10 ENCOUNTER — Inpatient Hospital Stay: Payer: Medicaid Other | Attending: Hematology

## 2021-08-10 VITALS — BP 111/81 | HR 88 | Temp 97.1°F | Resp 16 | Ht 62.0 in | Wt 120.5 lb

## 2021-08-10 DIAGNOSIS — Z79899 Other long term (current) drug therapy: Secondary | ICD-10-CM | POA: Insufficient documentation

## 2021-08-10 DIAGNOSIS — Z79811 Long term (current) use of aromatase inhibitors: Secondary | ICD-10-CM | POA: Diagnosis not present

## 2021-08-10 DIAGNOSIS — Z170 Estrogen receptor positive status [ER+]: Secondary | ICD-10-CM | POA: Diagnosis not present

## 2021-08-10 DIAGNOSIS — C50412 Malignant neoplasm of upper-outer quadrant of left female breast: Secondary | ICD-10-CM | POA: Diagnosis not present

## 2021-08-10 DIAGNOSIS — Z5111 Encounter for antineoplastic chemotherapy: Secondary | ICD-10-CM | POA: Insufficient documentation

## 2021-08-10 DIAGNOSIS — Z95828 Presence of other vascular implants and grafts: Secondary | ICD-10-CM

## 2021-08-10 LAB — CBC WITH DIFFERENTIAL (CANCER CENTER ONLY)
Abs Immature Granulocytes: 0.01 10*3/uL (ref 0.00–0.07)
Basophils Absolute: 0.1 10*3/uL (ref 0.0–0.1)
Basophils Relative: 1 %
Eosinophils Absolute: 0.1 10*3/uL (ref 0.0–0.5)
Eosinophils Relative: 1 %
HCT: 38.8 % (ref 36.0–46.0)
Hemoglobin: 13.1 g/dL (ref 12.0–15.0)
Immature Granulocytes: 0 %
Lymphocytes Relative: 52 %
Lymphs Abs: 2.9 10*3/uL (ref 0.7–4.0)
MCH: 29 pg (ref 26.0–34.0)
MCHC: 33.8 g/dL (ref 30.0–36.0)
MCV: 86 fL (ref 80.0–100.0)
Monocytes Absolute: 0.3 10*3/uL (ref 0.1–1.0)
Monocytes Relative: 5 %
Neutro Abs: 2.3 10*3/uL (ref 1.7–7.7)
Neutrophils Relative %: 41 %
Platelet Count: 245 10*3/uL (ref 150–400)
RBC: 4.51 MIL/uL (ref 3.87–5.11)
RDW: 13.2 % (ref 11.5–15.5)
WBC Count: 5.6 10*3/uL (ref 4.0–10.5)
nRBC: 0 % (ref 0.0–0.2)

## 2021-08-10 LAB — CMP (CANCER CENTER ONLY)
ALT: 9 U/L (ref 0–44)
AST: 15 U/L (ref 15–41)
Albumin: 4.3 g/dL (ref 3.5–5.0)
Alkaline Phosphatase: 74 U/L (ref 38–126)
Anion gap: 4 — ABNORMAL LOW (ref 5–15)
BUN: 11 mg/dL (ref 6–20)
CO2: 31 mmol/L (ref 22–32)
Calcium: 9.5 mg/dL (ref 8.9–10.3)
Chloride: 104 mmol/L (ref 98–111)
Creatinine: 0.8 mg/dL (ref 0.44–1.00)
GFR, Estimated: >60 mL/min (ref >60)
Glucose, Bld: 97 mg/dL (ref 70–99)
Potassium: 3.9 mmol/L (ref 3.5–5.1)
Sodium: 139 mmol/L (ref 135–145)
Total Bilirubin: 0.4 mg/dL (ref 0.3–1.2)
Total Protein: 7 g/dL (ref 6.5–8.1)

## 2021-08-10 MED ORDER — LETROZOLE 2.5 MG PO TABS: 2.5000 mg | ORAL_TABLET | Freq: Every day | ORAL | 1 refills | Status: DC

## 2021-08-10 MED ORDER — GOSERELIN ACETATE 3.6 MG ~~LOC~~ IMPL
3.6000 mg | DRUG_IMPLANT | Freq: Once | SUBCUTANEOUS | Status: AC
  Administered 2021-08-10: 3.6 mg via SUBCUTANEOUS
  Filled 2021-08-10: qty 3.6

## 2021-08-15 ENCOUNTER — Encounter: Payer: Self-pay | Admitting: Hematology

## 2021-08-16 ENCOUNTER — Other Ambulatory Visit: Payer: Self-pay

## 2021-08-16 ENCOUNTER — Telehealth: Payer: Self-pay | Admitting: Hematology

## 2021-08-16 NOTE — Telephone Encounter (Signed)
.  Called patient to schedule appointment per 2/23 inbasket, patient is aware of date and time.

## 2021-08-18 ENCOUNTER — Inpatient Hospital Stay: Payer: Medicaid Other

## 2021-08-20 ENCOUNTER — Telehealth: Payer: Self-pay | Admitting: Hematology

## 2021-08-20 NOTE — Telephone Encounter (Signed)
R/s [er 2/24 inbasket, left pt msg

## 2021-08-25 ENCOUNTER — Ambulatory Visit: Payer: Medicaid Other

## 2021-08-31 ENCOUNTER — Encounter: Payer: Self-pay | Admitting: Hematology

## 2021-08-31 ENCOUNTER — Telehealth: Payer: Self-pay | Admitting: *Deleted

## 2021-08-31 NOTE — Telephone Encounter (Signed)
Faxed orders for MRI and CT scan to [facility] in [place], [place] @ [phone]. Fax confirmation was received.  ?

## 2021-09-01 ENCOUNTER — Ambulatory Visit: Payer: Medicaid Other

## 2021-09-03 ENCOUNTER — Ambulatory Visit: Payer: Medicaid Other

## 2021-09-08 ENCOUNTER — Ambulatory Visit: Payer: Medicaid Other

## 2021-09-08 ENCOUNTER — Other Ambulatory Visit: Payer: Self-pay

## 2021-09-08 ENCOUNTER — Inpatient Hospital Stay: Payer: Medicaid Other | Attending: Hematology

## 2021-09-08 VITALS — BP 119/89 | HR 93 | Temp 98.2°F | Resp 19

## 2021-09-08 DIAGNOSIS — Z5111 Encounter for antineoplastic chemotherapy: Secondary | ICD-10-CM | POA: Diagnosis present

## 2021-09-08 DIAGNOSIS — C50412 Malignant neoplasm of upper-outer quadrant of left female breast: Secondary | ICD-10-CM | POA: Diagnosis present

## 2021-09-08 DIAGNOSIS — Z95828 Presence of other vascular implants and grafts: Secondary | ICD-10-CM

## 2021-09-08 DIAGNOSIS — Z170 Estrogen receptor positive status [ER+]: Secondary | ICD-10-CM | POA: Insufficient documentation

## 2021-09-08 MED ORDER — GOSERELIN ACETATE 3.6 MG ~~LOC~~ IMPL
3.6000 mg | DRUG_IMPLANT | Freq: Once | SUBCUTANEOUS | Status: AC
  Administered 2021-09-08: 3.6 mg via SUBCUTANEOUS
  Filled 2021-09-08: qty 3.6

## 2021-10-04 ENCOUNTER — Ambulatory Visit: Payer: Medicaid Other

## 2021-10-06 ENCOUNTER — Inpatient Hospital Stay: Payer: Medicaid Other | Attending: Hematology

## 2021-10-06 ENCOUNTER — Ambulatory Visit: Payer: Medicaid Other

## 2021-10-06 DIAGNOSIS — Z5111 Encounter for antineoplastic chemotherapy: Secondary | ICD-10-CM | POA: Insufficient documentation

## 2021-10-06 DIAGNOSIS — Z79899 Other long term (current) drug therapy: Secondary | ICD-10-CM | POA: Insufficient documentation

## 2021-10-06 DIAGNOSIS — C50412 Malignant neoplasm of upper-outer quadrant of left female breast: Secondary | ICD-10-CM | POA: Insufficient documentation

## 2021-10-08 ENCOUNTER — Encounter: Payer: Self-pay | Admitting: Hematology

## 2021-10-08 ENCOUNTER — Other Ambulatory Visit: Payer: Self-pay

## 2021-10-08 ENCOUNTER — Ambulatory Visit: Payer: Medicaid Other

## 2021-10-08 ENCOUNTER — Inpatient Hospital Stay: Payer: Medicaid Other

## 2021-10-08 VITALS — BP 124/95 | HR 80 | Temp 99.0°F | Resp 18

## 2021-10-08 DIAGNOSIS — C50412 Malignant neoplasm of upper-outer quadrant of left female breast: Secondary | ICD-10-CM | POA: Diagnosis present

## 2021-10-08 DIAGNOSIS — Z95828 Presence of other vascular implants and grafts: Secondary | ICD-10-CM

## 2021-10-08 DIAGNOSIS — Z5111 Encounter for antineoplastic chemotherapy: Secondary | ICD-10-CM | POA: Diagnosis present

## 2021-10-08 DIAGNOSIS — Z79899 Other long term (current) drug therapy: Secondary | ICD-10-CM | POA: Diagnosis not present

## 2021-10-08 MED ORDER — GOSERELIN ACETATE 3.6 MG ~~LOC~~ IMPL
3.6000 mg | DRUG_IMPLANT | Freq: Once | SUBCUTANEOUS | Status: AC
  Administered 2021-10-08: 3.6 mg via SUBCUTANEOUS
  Filled 2021-10-08: qty 3.6

## 2021-10-08 NOTE — Progress Notes (Signed)
Pt called, she states she got a hold of a scheduler and they have rescheduled her injection appointment for today as requested. No further questions or concerns.  ?

## 2021-11-03 ENCOUNTER — Inpatient Hospital Stay: Payer: Medicaid Other

## 2021-11-07 ENCOUNTER — Inpatient Hospital Stay: Payer: Medicaid Other | Attending: Hematology

## 2021-11-07 ENCOUNTER — Other Ambulatory Visit: Payer: Self-pay

## 2021-11-07 VITALS — BP 126/83 | HR 78 | Temp 98.9°F | Resp 18

## 2021-11-07 DIAGNOSIS — Z79899 Other long term (current) drug therapy: Secondary | ICD-10-CM | POA: Insufficient documentation

## 2021-11-07 DIAGNOSIS — Z5111 Encounter for antineoplastic chemotherapy: Secondary | ICD-10-CM | POA: Insufficient documentation

## 2021-11-07 DIAGNOSIS — Z95828 Presence of other vascular implants and grafts: Secondary | ICD-10-CM

## 2021-11-07 DIAGNOSIS — C50412 Malignant neoplasm of upper-outer quadrant of left female breast: Secondary | ICD-10-CM | POA: Diagnosis present

## 2021-11-07 MED ORDER — GOSERELIN ACETATE 3.6 MG ~~LOC~~ IMPL
3.6000 mg | DRUG_IMPLANT | Freq: Once | SUBCUTANEOUS | Status: AC
  Administered 2021-11-07: 3.6 mg via SUBCUTANEOUS
  Filled 2021-11-07: qty 3.6

## 2021-12-11 ENCOUNTER — Telehealth: Payer: Self-pay | Admitting: Hematology

## 2021-12-11 NOTE — Telephone Encounter (Signed)
Rescheduled upcoming appointment per provider's request. Patient is aware of changes. 

## 2021-12-13 ENCOUNTER — Other Ambulatory Visit: Payer: Medicaid Other

## 2021-12-13 ENCOUNTER — Ambulatory Visit: Payer: Medicaid Other | Admitting: Hematology

## 2021-12-13 ENCOUNTER — Ambulatory Visit: Payer: Medicaid Other

## 2021-12-17 ENCOUNTER — Other Ambulatory Visit: Payer: Self-pay

## 2021-12-17 ENCOUNTER — Telehealth: Payer: Self-pay | Admitting: Internal Medicine

## 2021-12-17 ENCOUNTER — Inpatient Hospital Stay: Payer: Medicaid Other | Attending: Hematology

## 2021-12-17 ENCOUNTER — Inpatient Hospital Stay (HOSPITAL_BASED_OUTPATIENT_CLINIC_OR_DEPARTMENT_OTHER): Payer: Medicaid Other | Admitting: Hematology

## 2021-12-17 ENCOUNTER — Encounter: Payer: Self-pay | Admitting: Hematology

## 2021-12-17 ENCOUNTER — Inpatient Hospital Stay: Payer: Medicaid Other

## 2021-12-17 VITALS — BP 132/84 | HR 82 | Temp 98.8°F | Resp 17 | Ht 62.0 in | Wt 125.1 lb

## 2021-12-17 DIAGNOSIS — Z1231 Encounter for screening mammogram for malignant neoplasm of breast: Secondary | ICD-10-CM

## 2021-12-17 DIAGNOSIS — Z95828 Presence of other vascular implants and grafts: Secondary | ICD-10-CM

## 2021-12-17 DIAGNOSIS — Z5111 Encounter for antineoplastic chemotherapy: Secondary | ICD-10-CM | POA: Insufficient documentation

## 2021-12-17 DIAGNOSIS — Z79899 Other long term (current) drug therapy: Secondary | ICD-10-CM | POA: Insufficient documentation

## 2021-12-17 DIAGNOSIS — Z170 Estrogen receptor positive status [ER+]: Secondary | ICD-10-CM | POA: Diagnosis not present

## 2021-12-17 DIAGNOSIS — C50412 Malignant neoplasm of upper-outer quadrant of left female breast: Secondary | ICD-10-CM | POA: Diagnosis present

## 2021-12-17 LAB — CBC WITH DIFFERENTIAL (CANCER CENTER ONLY)
Abs Immature Granulocytes: 0.01 10*3/uL (ref 0.00–0.07)
Basophils Absolute: 0.1 10*3/uL (ref 0.0–0.1)
Basophils Relative: 1 %
Eosinophils Absolute: 0.1 10*3/uL (ref 0.0–0.5)
Eosinophils Relative: 2 %
HCT: 38.1 % (ref 36.0–46.0)
Hemoglobin: 13.1 g/dL (ref 12.0–15.0)
Immature Granulocytes: 0 %
Lymphocytes Relative: 48 %
Lymphs Abs: 2.6 10*3/uL (ref 0.7–4.0)
MCH: 29.5 pg (ref 26.0–34.0)
MCHC: 34.4 g/dL (ref 30.0–36.0)
MCV: 85.8 fL (ref 80.0–100.0)
Monocytes Absolute: 0.3 10*3/uL (ref 0.1–1.0)
Monocytes Relative: 5 %
Neutro Abs: 2.4 10*3/uL (ref 1.7–7.7)
Neutrophils Relative %: 44 %
Platelet Count: 238 10*3/uL (ref 150–400)
RBC: 4.44 MIL/uL (ref 3.87–5.11)
RDW: 13 % (ref 11.5–15.5)
WBC Count: 5.5 10*3/uL (ref 4.0–10.5)
nRBC: 0 % (ref 0.0–0.2)

## 2021-12-17 LAB — CMP (CANCER CENTER ONLY)
ALT: 11 U/L (ref 0–44)
AST: 15 U/L (ref 15–41)
Albumin: 4.1 g/dL (ref 3.5–5.0)
Alkaline Phosphatase: 88 U/L (ref 38–126)
Anion gap: 6 (ref 5–15)
BUN: 17 mg/dL (ref 6–20)
CO2: 28 mmol/L (ref 22–32)
Calcium: 9.4 mg/dL (ref 8.9–10.3)
Chloride: 105 mmol/L (ref 98–111)
Creatinine: 0.8 mg/dL (ref 0.44–1.00)
GFR, Estimated: >60 mL/min (ref >60)
Glucose, Bld: 117 mg/dL — ABNORMAL HIGH (ref 70–99)
Potassium: 4 mmol/L (ref 3.5–5.1)
Sodium: 139 mmol/L (ref 135–145)
Total Bilirubin: 0.2 mg/dL — ABNORMAL LOW (ref 0.3–1.2)
Total Protein: 6.8 g/dL (ref 6.5–8.1)

## 2021-12-17 MED ORDER — GOSERELIN ACETATE 3.6 MG ~~LOC~~ IMPL
3.6000 mg | DRUG_IMPLANT | Freq: Once | SUBCUTANEOUS | Status: AC
  Administered 2021-12-17: 3.6 mg via SUBCUTANEOUS
  Filled 2021-12-17: qty 3.6

## 2021-12-17 MED ORDER — GABAPENTIN 100 MG PO CAPS
100.0000 mg | ORAL_CAPSULE | Freq: Every day | ORAL | 0 refills | Status: DC
Start: 2021-12-17 — End: 2022-04-15

## 2021-12-17 NOTE — Telephone Encounter (Signed)
Contacted patient to scheduled new patient appointment with Dr. Barbaraann Cao. Patient advised that she would call back to schedule because she did not have her work schedule.

## 2021-12-18 ENCOUNTER — Telehealth: Payer: Self-pay

## 2021-12-18 ENCOUNTER — Telehealth: Payer: Self-pay | Admitting: Internal Medicine

## 2021-12-19 ENCOUNTER — Telehealth: Payer: Self-pay | Admitting: Internal Medicine

## 2021-12-19 ENCOUNTER — Ambulatory Visit: Payer: Medicaid Other | Admitting: Physician Assistant

## 2021-12-19 ENCOUNTER — Ambulatory Visit: Payer: Medicaid Other

## 2021-12-19 ENCOUNTER — Other Ambulatory Visit: Payer: Medicaid Other

## 2021-12-19 NOTE — Telephone Encounter (Signed)
Called pt to sch appt per 6/26 staff msg. No answer. Left msg for pt to call back to sch appt.

## 2021-12-20 ENCOUNTER — Telehealth: Payer: Self-pay | Admitting: Internal Medicine

## 2021-12-20 NOTE — Telephone Encounter (Signed)
Called pt to sch appt per 6/26 staff msg. No answer. Left msg for pt to call back to sch appt.

## 2021-12-21 ENCOUNTER — Telehealth: Payer: Self-pay | Admitting: Internal Medicine

## 2021-12-21 NOTE — Telephone Encounter (Signed)
Called pt to sch appt per 6/26 staff msg. No answer and vm was full.

## 2022-01-03 ENCOUNTER — Telehealth: Payer: Self-pay

## 2022-01-03 ENCOUNTER — Other Ambulatory Visit: Payer: Self-pay

## 2022-01-03 NOTE — Telephone Encounter (Signed)
Faxed orders to [facility] for mammograms and sent [facility] order for CT Scan per pt's request.  Pt was told by this RN and Joy, LPN why previous orders were sent to [facility] but pt insisting on orders be sent to [facility].

## 2022-01-12 ENCOUNTER — Inpatient Hospital Stay: Payer: Medicaid Other | Attending: Hematology

## 2022-01-12 VITALS — BP 117/80 | HR 92 | Temp 98.4°F | Resp 16

## 2022-01-12 DIAGNOSIS — C50412 Malignant neoplasm of upper-outer quadrant of left female breast: Secondary | ICD-10-CM | POA: Diagnosis present

## 2022-01-12 DIAGNOSIS — Z5111 Encounter for antineoplastic chemotherapy: Secondary | ICD-10-CM | POA: Insufficient documentation

## 2022-01-12 DIAGNOSIS — Z79899 Other long term (current) drug therapy: Secondary | ICD-10-CM | POA: Insufficient documentation

## 2022-01-12 DIAGNOSIS — Z95828 Presence of other vascular implants and grafts: Secondary | ICD-10-CM

## 2022-01-12 MED ORDER — GOSERELIN ACETATE 3.6 MG ~~LOC~~ IMPL
3.6000 mg | DRUG_IMPLANT | Freq: Once | SUBCUTANEOUS | Status: AC
  Administered 2022-01-12: 3.6 mg via SUBCUTANEOUS

## 2022-01-18 ENCOUNTER — Other Ambulatory Visit: Payer: Self-pay | Admitting: Hematology

## 2022-01-18 MED ORDER — ANASTROZOLE 1 MG PO TABS: 1.0000 mg | ORAL_TABLET | Freq: Every day | ORAL | 3 refills | Status: DC

## 2022-01-30 ENCOUNTER — Telehealth: Payer: Self-pay | Admitting: Internal Medicine

## 2022-01-30 NOTE — Telephone Encounter (Signed)
Per 8/9 phone line pt called to r/s appointment r/s per pt

## 2022-02-09 ENCOUNTER — Ambulatory Visit: Payer: Medicaid Other

## 2022-02-12 ENCOUNTER — Inpatient Hospital Stay: Payer: Medicaid Other

## 2022-02-16 ENCOUNTER — Inpatient Hospital Stay: Payer: Medicaid Other | Attending: Hematology

## 2022-02-16 ENCOUNTER — Other Ambulatory Visit: Payer: Self-pay

## 2022-02-16 VITALS — BP 109/87 | HR 88 | Temp 99.1°F | Resp 16

## 2022-02-16 DIAGNOSIS — Z95828 Presence of other vascular implants and grafts: Secondary | ICD-10-CM

## 2022-02-16 DIAGNOSIS — Z5111 Encounter for antineoplastic chemotherapy: Secondary | ICD-10-CM | POA: Diagnosis not present

## 2022-02-16 DIAGNOSIS — Z79899 Other long term (current) drug therapy: Secondary | ICD-10-CM | POA: Insufficient documentation

## 2022-02-16 DIAGNOSIS — C50412 Malignant neoplasm of upper-outer quadrant of left female breast: Secondary | ICD-10-CM | POA: Diagnosis present

## 2022-02-16 MED ORDER — GOSERELIN ACETATE 3.6 MG ~~LOC~~ IMPL
3.6000 mg | DRUG_IMPLANT | Freq: Once | SUBCUTANEOUS | Status: AC
  Administered 2022-02-16: 3.6 mg via SUBCUTANEOUS
  Filled 2022-02-16: qty 3.6

## 2022-02-16 NOTE — Patient Instructions (Signed)
Goserelin Implant What is this medication? GOSERELIN (GOE se rel in) treats prostate cancer and breast cancer. It works by decreasing levels of the hormones testosterone and estrogen in the body. This prevents prostate and breast cancer cells from spreading or growing. It may also be used to treat endometriosis. This is a condition where the tissue that lines the uterus grows outside the uterus. It works by decreasing the amount of estrogen your body makes, which reduces heavy bleeding and pain. It can also be used to help thin the lining of the uterus before a surgery used to prevent or reduce heavy periods. This medicine may be used for other purposes; ask your health care provider or pharmacist if you have questions. COMMON BRAND NAME(S): Zoladex, Zoladex 3-Month What should I tell my care team before I take this medication? They need to know if you have any of these conditions: Bone problems Diabetes Heart disease History of irregular heartbeat or rhythm An unusual or allergic reaction to goserelin, other medications, foods, dyes, or preservatives Pregnant or trying to get pregnant Breastfeeding How should I use this medication? This medication is injected under the skin. It is given by your care team in a hospital or clinic setting. Talk to your care team about the use of this medication in children. Special care may be needed. Overdosage: If you think you have taken too much of this medicine contact a poison control center or emergency room at once. NOTE: This medicine is only for you. Do not share this medicine with others. What if I miss a dose? Keep appointments for follow-up doses. It is important not to miss your dose. Call your care team if you are unable to keep an appointment. What may interact with this medication? Do not take this medication with any of the following: Cisapride Dronedarone Pimozide Thioridazine This medication may also interact with the following: Other  medications that cause heart rhythm changes This list may not describe all possible interactions. Give your health care provider a list of all the medicines, herbs, non-prescription drugs, or dietary supplements you use. Also tell them if you smoke, drink alcohol, or use illegal drugs. Some items may interact with your medicine. What should I watch for while using this medication? Visit your care team for regular checks on your progress. Your symptoms may appear to get worse during the first weeks of this therapy. Tell your care team if your symptoms do not start to get better or if they get worse after this time. Using this medication for a long time may weaken your bones. If you smoke or frequently drink alcohol you may increase your risk of bone loss. A family history of osteoporosis, chronic use of medications for seizures (convulsions), or corticosteroids can also increase your risk of bone loss. The risk of bone fractures may be increased. Talk to your care team about your bone health. This medication may increase blood sugar. The risk may be higher in patients who already have diabetes. Ask your care team what you can do to lower your risk of diabetes while taking this medication. This medication should stop regular monthly menstruation in women. Tell your care team if you continue to menstruate. Talk to your care team if you wish to become pregnant or think you might be pregnant. This medication can cause serious birth defects if taken during pregnancy or for 12 weeks after stopping treatment. Talk to your care team about reliable forms of contraception. Do not breastfeed while taking this   medication. This medication may cause infertility. Talk to your care team if you are concerned about your fertility. What side effects may I notice from receiving this medication? Side effects that you should report to your care team as soon as possible: Allergic reactions--skin rash, itching, hives, swelling  of the face, lips, tongue, or throat Change in the amount of urine Heart attack--pain or tightness in the chest, shoulders, arms, or jaw, nausea, shortness of breath, cold or clammy skin, feeling faint or lightheaded Heart rhythm changes--fast or irregular heartbeat, dizziness, feeling faint or lightheaded, chest pain, trouble breathing High blood sugar (hyperglycemia)--increased thirst or amount of urine, unusual weakness or fatigue, blurry vision High calcium level--increased thirst or amount of urine, nausea, vomiting, confusion, unusual weakness or fatigue, bone pain Pain, redness, irritation, or bruising at the injection site Severe back pain, numbness or weakness of the hands, arms, legs, or feet, loss of coordination, loss of bowel or bladder control Stroke--sudden numbness or weakness of the face, arm, or leg, trouble speaking, confusion, trouble walking, loss of balance or coordination, dizziness, severe headache, change in vision Swelling and pain of the tumor site or lymph nodes Trouble passing urine Side effects that usually do not require medical attention (report to your care team if they continue or are bothersome): Change in sex drive or performance Headache Hot flashes Rapid or extreme change in emotion or mood Sweating Swelling of the ankles, hands, or feet Unusual vaginal discharge, itching, or odor This list may not describe all possible side effects. Call your doctor for medical advice about side effects. You may report side effects to FDA at 1-800-FDA-1088. Where should I keep my medication? This medication is given in a hospital or clinic. It will not be stored at home. NOTE: This sheet is a summary. It may not cover all possible information. If you have questions about this medicine, talk to your doctor, pharmacist, or health care provider.  2023 Elsevier/Gold Standard (2021-10-24 00:00:00)  

## 2022-02-20 ENCOUNTER — Encounter: Payer: Self-pay | Admitting: Hematology

## 2022-03-07 ENCOUNTER — Encounter: Payer: Self-pay | Admitting: Hematology

## 2022-03-09 ENCOUNTER — Ambulatory Visit: Payer: Medicaid Other

## 2022-03-14 ENCOUNTER — Inpatient Hospital Stay: Payer: Medicaid Other | Attending: Hematology

## 2022-03-14 VITALS — BP 125/79 | HR 95 | Temp 99.2°F | Resp 18

## 2022-03-14 DIAGNOSIS — Z79899 Other long term (current) drug therapy: Secondary | ICD-10-CM | POA: Diagnosis not present

## 2022-03-14 DIAGNOSIS — Z95828 Presence of other vascular implants and grafts: Secondary | ICD-10-CM

## 2022-03-14 DIAGNOSIS — C50412 Malignant neoplasm of upper-outer quadrant of left female breast: Secondary | ICD-10-CM | POA: Diagnosis present

## 2022-03-14 DIAGNOSIS — Z5111 Encounter for antineoplastic chemotherapy: Secondary | ICD-10-CM | POA: Diagnosis present

## 2022-03-14 MED ORDER — GOSERELIN ACETATE 3.6 MG ~~LOC~~ IMPL
3.6000 mg | DRUG_IMPLANT | Freq: Once | SUBCUTANEOUS | Status: AC
  Administered 2022-03-14: 3.6 mg via SUBCUTANEOUS
  Filled 2022-03-14: qty 3.6

## 2022-04-05 ENCOUNTER — Encounter: Payer: Self-pay | Admitting: Hematology

## 2022-04-08 ENCOUNTER — Encounter: Payer: Self-pay | Admitting: Hematology

## 2022-04-15 ENCOUNTER — Inpatient Hospital Stay: Payer: Medicaid Other | Admitting: Hematology

## 2022-04-15 ENCOUNTER — Inpatient Hospital Stay: Payer: Medicaid Other

## 2022-04-15 ENCOUNTER — Encounter: Payer: Self-pay | Admitting: Hematology

## 2022-04-15 ENCOUNTER — Inpatient Hospital Stay: Payer: Medicaid Other | Attending: Hematology

## 2022-04-15 VITALS — BP 115/82 | HR 86 | Temp 98.3°F | Resp 17 | Ht 62.0 in | Wt 125.0 lb

## 2022-04-15 DIAGNOSIS — C50412 Malignant neoplasm of upper-outer quadrant of left female breast: Secondary | ICD-10-CM

## 2022-04-15 DIAGNOSIS — Z170 Estrogen receptor positive status [ER+]: Secondary | ICD-10-CM

## 2022-04-15 DIAGNOSIS — Z79899 Other long term (current) drug therapy: Secondary | ICD-10-CM | POA: Diagnosis not present

## 2022-04-15 DIAGNOSIS — Z95828 Presence of other vascular implants and grafts: Secondary | ICD-10-CM

## 2022-04-15 DIAGNOSIS — Z5111 Encounter for antineoplastic chemotherapy: Secondary | ICD-10-CM | POA: Diagnosis not present

## 2022-04-15 LAB — CBC WITH DIFFERENTIAL (CANCER CENTER ONLY)
Abs Immature Granulocytes: 0.01 10*3/uL (ref 0.00–0.07)
Basophils Absolute: 0.1 10*3/uL (ref 0.0–0.1)
Basophils Relative: 1 %
Eosinophils Absolute: 0.1 10*3/uL (ref 0.0–0.5)
Eosinophils Relative: 2 %
HCT: 40 % (ref 36.0–46.0)
Hemoglobin: 13.6 g/dL (ref 12.0–15.0)
Immature Granulocytes: 0 %
Lymphocytes Relative: 55 %
Lymphs Abs: 2.9 10*3/uL (ref 0.7–4.0)
MCH: 29.3 pg (ref 26.0–34.0)
MCHC: 34 g/dL (ref 30.0–36.0)
MCV: 86.2 fL (ref 80.0–100.0)
Monocytes Absolute: 0.3 10*3/uL (ref 0.1–1.0)
Monocytes Relative: 6 %
Neutro Abs: 1.9 10*3/uL (ref 1.7–7.7)
Neutrophils Relative %: 36 %
Platelet Count: 240 10*3/uL (ref 150–400)
RBC: 4.64 MIL/uL (ref 3.87–5.11)
RDW: 12.9 % (ref 11.5–15.5)
WBC Count: 5.3 10*3/uL (ref 4.0–10.5)
nRBC: 0 % (ref 0.0–0.2)

## 2022-04-15 LAB — CMP (CANCER CENTER ONLY)
ALT: 9 U/L (ref 0–44)
AST: 15 U/L (ref 15–41)
Albumin: 4.1 g/dL (ref 3.5–5.0)
Alkaline Phosphatase: 77 U/L (ref 38–126)
Anion gap: 4 — ABNORMAL LOW (ref 5–15)
BUN: 12 mg/dL (ref 6–20)
CO2: 30 mmol/L (ref 22–32)
Calcium: 9.4 mg/dL (ref 8.9–10.3)
Chloride: 106 mmol/L (ref 98–111)
Creatinine: 0.79 mg/dL (ref 0.44–1.00)
GFR, Estimated: >60 mL/min (ref >60)
Glucose, Bld: 71 mg/dL (ref 70–99)
Potassium: 3.7 mmol/L (ref 3.5–5.1)
Sodium: 140 mmol/L (ref 135–145)
Total Bilirubin: 0.4 mg/dL (ref 0.3–1.2)
Total Protein: 7 g/dL (ref 6.5–8.1)

## 2022-04-15 MED ORDER — GOSERELIN ACETATE 3.6 MG ~~LOC~~ IMPL
3.6000 mg | DRUG_IMPLANT | Freq: Once | SUBCUTANEOUS | Status: AC
  Administered 2022-04-15: 3.6 mg via SUBCUTANEOUS
  Filled 2022-04-15: qty 3.6

## 2022-04-15 MED ORDER — ANASTROZOLE 1 MG PO TABS: 1.0000 mg | ORAL_TABLET | Freq: Every day | ORAL | 5 refills | Status: DC

## 2022-04-15 NOTE — Progress Notes (Signed)
Victoria Barker   [phone] [phone]   Clinic Follow up Note   Patient Care Team: [name], MD as PCP - General (Obstetrics and Gynecology) Mauro Kaufmann, RN as Oncology Nurse Navigator Rockwell Germany, RN as Oncology Nurse Navigator Donnie Mesa, MD as Consulting Physician (General Surgery) Truitt Merle, MD as Consulting Physician (Hematology) Eppie Gibson, MD as Attending Physician (Radiation Oncology) [place] as Consulting Physician (Diagnostic Radiology)  Date of Service:  04/15/2022  CHIEF COMPLAINT: f/u of left breast cancer  CURRENT THERAPY:  Antiestrogen Therapy, starting 05/26/19, held 07/13/19 - 12/2019 for surgery/chemo             -currently anastrozole since 12/2021 Zoladex, starting 06/15/20  ASSESSMENT & PLAN:  Victoria Barker is a 41 y.o. pre-menopausal female with   1. Malignant neoplasm of upper-outer quadrant of left breast, invasive ductal carcinoma and DCIS, stage IIA, pT2N0M0, ER+/PR-/HER2+, Grade III -diagnosed in 04/2019, started tamoxifen neoadjvantly 05/26/19. S/p left breast mastectomy 07/13/19 with Dr. Georgette Dover and reconstruction with Dr. Iran Planas. Oncotype RS of 56, but tumor was HER2+ on repeat prognostic panel. -s/p 6 cycles of adjuvant TCH (09/02/19 - 12/16/19) and one year trastuzumab in 08/2020 -She restarted Tamoxifen 12/2019 - 06/2020, but stopped due to nausea and insomnia -She started ovarian suppression Zoladex injections on 06/15/20, and switched to Letrozole in 07/2020.  -She took neratinib 11/09/20 - 12/19/20 but did not tolerate due to GI symptoms -she switched to anastrozole on 01/18/22, and her headaches subsequently resolved and hot flashes improved. We discussed BSO again today, and she notes she has not discussed with GYN yet. I encouraged her to at least discuss options; she agrees. -most recent right MM 06/13/21 was benign. -she is  clinically doing very well, tolerating anastrozole much better. Labs reviewed, overall WNL. Physical exam was unremarkable. There is no clinical concern for recurrence. -Continue monthly Zoladex injection and letrozole, plan for a total of 7 years.   2. Hot Flashes -secondary to letrozole, no relief from effexor. -improved on anastrozole   3. Genetic testing negative for pathogenetic mutations.    4. Right 7th rib lesion -She was found to have a 0.7 x 0.3 cm sclerotic lesion in the right seventh rib on CT scan in March 2021, no prior injury or rib fracture -She is asymptomatic -not seen in repeat CT 04/03/22. I reviewed the outside CT report and discussed with pt      PLAN:  -proceed with Zoladex injection today and every 4 weeks  -she prefers Saturday appointments -Continue anastrozole  -Screening right mammogram in December 2023 -lab and f/u in 5 months   No problem-specific Assessment & Plan notes found for this encounter.   SUMMARY OF ONCOLOGIC HISTORY: Oncology History Overview Note  Cancer Staging Malignant neoplasm of upper-outer quadrant of left breast in female, estrogen receptor positive ([facility]) Staging form: Breast, AJCC 8th Edition - Clinical stage from 04/28/2019: Stage IIA (cT2, cN0, cM0, G2, ER+, PR-, HER2: Equivocal) - Signed by Truitt Merle, MD on 05/04/2019 - Pathologic stage from 07/13/2019: Stage IIA (pT2, pN0, cM0, G3, ER+, PR-, HER2+, Oncotype DX score: 56) - Signed by Truitt Merle, MD on 08/20/2019    Malignant neoplasm of upper-outer quadrant of left breast in female, estrogen receptor positive ([place])  04/21/2019 Mammogram   Diagnostic mammogram 04/21/19  IMPRESSION 1. findings highly suspicious for left breast cancer. There is a palpable irregular mass 4cm from nipple measuring 2.4x2.4x1.8cm  in the 1:00 position axis of the left breast and pleomorphic  calcifications in the upper-outer quadrant as described above.  2. single left 0.9x0.5cm axillary lymph node  with a mild/borderline thickened cortex.    04/28/2019 Cancer Staging   Staging form: Breast, AJCC 8th Edition - Clinical stage from 04/28/2019: Stage IIA (cT2, cN0, cM0, G2, ER+, PR-, HER2: Equivocal) - Signed by Truitt Merle, MD on 05/04/2019   04/28/2019 Initial Biopsy   Diagnosis 04/28/19 1. Breast, left, needle core biopsy, 1 o'clock position - INVASIVE DUCTAL CARCINOMA. - DUCTAL CARCINOMA IN SITU. - SEE COMMENT. 2. Lymph node, needle/core biopsy, left axilla - THERE IS NO EVIDENCE OF CARCINOMA IN 1 OF 1 LYMPH NODE (0/1). 3. Breast, left, needle core biopsy, upper, outer quadrant - DUCTAL CARCINOMA IN SITU WITH CALCIFICATIONS. - SEE COMMENT.   04/28/2019 Receptors her2   1. PROGNOSTIC INDICATORS Results: HER2 negative  Estrogen Receptor: 80%, POSITIVE, STRONG STAINING INTENSITY Progesterone Receptor: 0%, NEGATIVE Proliferation Marker Ki67: 35%    04/30/2019 Initial Diagnosis   Malignant neoplasm of upper-outer quadrant of left breast in female, estrogen receptor positive ([name])    Genetic Testing   No pathogenic variants identified. 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 The final report date is 05/28/2019.  The STAT Breast cancer panel offered by [facility] includes sequencing and rearrangement analysis for the following 9 genes:  ATM, BRCA1, BRCA2, CDH1, CHEK2, PALB2, PTEN, STK11 and TP53.    The Common Hereditary Cancers Panel offered by [facility] includes sequencing and/or deletion duplication testing of the following 48 genes: APC, ATM, AXIN2, BARD1, BMPR1A, BRCA1, BRCA2, BRIP1, CDH1, CDKN2A (p14ARF), CDKN2A (p16INK4a), CKD4, CHEK2, CTNNA1, DICER1, EPCAM (Deletion/duplication testing only), GREM1 (promoter region deletion/duplication testing only), KIT, MEN1, MLH1, MSH2, MSH3, MSH6, MUTYH, NBN, NF1, NHTL1, PALB2, PDGFRA, PMS2, POLD1, POLE, PTEN, RAD50, RAD51C, RAD51D, RNF43, SDHB, SDHC, SDHD, SMAD4, SMARCA4.  STK11, TP53, TSC1, TSC2, and VHL.  The following genes were evaluated for sequence changes only: SDHA and HOXB13 c.251G>A variant only.   05/11/2019 Breast MRI   IMPRESSION: 1. Enhancing mass and architectural distortion spanning approximately 6 x 3 x 5.3 cm in the upper outer left breast consistent with the patient's biopsy-proven site of invasive cancer. 2. Biopsy changes in the anterior left breast at the site of the patient's biopsy-proven ductal carcinoma in situ. 3. Suspicious changes within the upper-outer quadrant of the left breast spanning a total of 8 cm in the AP dimension. This includes the site of biopsy-proven DCIS anteriorly and suspicious enhancement extending to the level of the pectoralis muscle posteriorly. 4. Suspicious 7 mm enhancing mass in the far posterior slightly lateral left breast (image 138/212). 5. No MRI evidence of malignancy on the right. 6. No suspicious lymphadenopathy.     05/26/2019 -  Anti-estrogen oral therapy   Tamoxifen 20m daily starting 05/26/19. Stopped before surgery and chemo on 07/13/19. Restart in 12/2019. Due to insomnia and nausea, reduced dose to 10 in 05/2020.  ---------Started her on Ovarian suppression Zoladex on 06/15/20.  ----------Switched to Letrozole in 07/2020.    07/13/2019 Surgery   LEFT NIPPLE SPARING MASTECTOMY WITH SENTINEL LYMPH NODE BIOPSY and LEFT BREAST RECONSTRUCTION WITH PLACEMENT OF TISSUE EXPANDER AND ALLODERM by D.r Tsuie and Dr TIran Planas    07/13/2019 Pathology Results   SURGICAL PATHOLOGY   FINAL MICROSCOPIC DIAGNOSIS:   A. LYMPH NODE, LEFT AXILLARY #1, SENTINEL, BIOPSY:  - One of one lymph nodes negative for  carcinoma (0/1).   B. BREAST, LEFT, MASTECTOMY:  - Invasive ductal carcinoma, grade 3, spanning 3.4 cm.  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  - Invasive carcinoma is 0.2 cm from the posterior margin focally (not on  ink) and 0.4 cm from the  anterior margin focally (not on ink).  -  In situ carcinoma is 0.4 cm from the posterior margin focally (not on  ink).  - Biopsy site x2.  - See oncology table.   C. NIPPLE, LEFT, BIOPSY:  - Benign nipple tissue.   D. LYMPH NODE, LEFT AXILLARY #2, SENTINEL, BIOPSY:  - One of one lymph nodes negative for carcinoma (0/1).     GROUP 1:  HER2 **POSITIVE**    07/13/2019 Oncotype testing   Ocotype score 56 with 39% risk of recurrence with Tmaoxifen or AI alone. There is a 15% benefit from adjuvant chemo.     07/13/2019 Cancer Staging   Staging form: Breast, AJCC 8th Edition - Pathologic stage from 07/13/2019: Stage IIA (pT2, pN0, cM0, G3, ER+, PR-, HER2+, Oncotype DX score: 56) - Signed by Truitt Merle, MD on 08/20/2019   08/25/2019 Echocardiogram   Baseline Echo  IMPRESSIONS     1. Left ventricular ejection fraction, by estimation, is 65 to 70%. The  left ventricle has normal function. The left ventricle has no regional  wall motion abnormalities. Left ventricular diastolic parameters were  normal.   2. Right ventricular systolic function is normal. The right ventricular  size is normal. There is normal pulmonary artery systolic pressure.   3. Left atrial size was mildly dilated.   4. The mitral valve is normal in structure and function. Trivial mitral  valve regurgitation.   5. The aortic valve is tricuspid. Aortic valve regurgitation is not  visualized.   6. The inferior vena cava is normal in size with greater than 50%  respiratory variability, suggesting right atrial pressure of 3 mmHg.   7. The aortic valve is not well seen. It is trileaflet. There appears to  be some calcification associated with the non-coronary cusp but this is  not well seen.    08/25/2019 Imaging   Whole body Bone Scan  IMPRESSION: No significant abnormality identified.   08/25/2019 Imaging   CT CAP w Contrast  IMPRESSION: 1. No findings of metastatic disease to the chest, abdomen, or pelvis. 2. 0.7 by 0.3 cm sclerotic lesion in the right  seventh rib laterally is likely benign but merits surveillance. 3. Mild sclerosis along the sacroiliac joints bilaterally, query mild chronic sacroiliitis.     09/01/2019 Procedure   INSERTION PORT-A-CATH WITH ULTRASOUND GUIDANCE by Dr. Georgette Dover   09/02/2019 -  Chemotherapy   TCH (Taxol, Carboplatin, Herceptin) q3weeks for 6 cycles starting 09/02/19-12/16/19, followed by anti-HER2 maintenance therapy with Herceptin q3weeks starting 01/06/20 to complete 1 year of treatment (from 08/2019)     05/05/2020 Surgery   REMOVAL OF LEFT CHEST TISSUE EXPANDER AND PLACEMENT OF IMPLANT and RIGHT BREAST MASTOPEXY and LIPOFILLING FROM ABDOMEN TO RIGHT CHEST by Dr Iran Planas   11/09/2020 - 12/19/2020 Chemotherapy   Neratinib, stopped due to severe nausea/vomiting    Invasive ductal carcinoma of breast, female, left ([place])  07/13/2019 Initial Diagnosis   Invasive ductal carcinoma of breast, female, left ([place])   11/09/2020 - 12/19/2020 Chemotherapy   Neratinib, stopped due to severe nausea/vomiting       INTERVAL HISTORY:  Victoria Barker is here for a follow up of breast cancer. She was last seen by me  on 12/17/21. She presents to the clinic alone. She reports she is doing well overall. She tells me her headaches resolved after discontinuing letrozole. She also notes the switch to anastrozole has also helped her hot flashes, and she no longer wakes up at night sweating.   All other systems were reviewed with the patient and are negative.  MEDICAL HISTORY:  Past Medical History:  Diagnosis Date   Breast cancer ([facility]) 2020   Left Mastectomy   Cancer ([place])    Left breast Ca   GERD (gastroesophageal reflux disease)    Personal history of chemotherapy 08/2019-11/2019   Scoliosis    Sickle cell trait ([place])     SURGICAL HISTORY: Past Surgical History:  Procedure Laterality Date   BREAST IMPLANT EXCHANGE Left 09/26/2020   Procedure: REVISION LEFT BREAST RECONSTRUCTION WITH SILICONE IMPLANT EXCHANGE;   Surgeon: Irene Limbo, MD;  Location: [facility];  Service: Plastics;  Laterality: Left;   BREAST RECONSTRUCTION WITH PLACEMENT OF TISSUE EXPANDER AND ALLODERM Left 07/13/2019   Procedure: LEFT BREAST RECONSTRUCTION WITH PLACEMENT OF TISSUE EXPANDER AND ALLODERM;  Surgeon: Irene Limbo, MD;  Location: [facility];  Service: Plastics;  Laterality: Left;   BREAST REDUCTION SURGERY Right 09/26/2020   Procedure: REVISION RIGHT BREAST REDUCTION;  Surgeon: Irene Limbo, MD;  Location: [facility];  Service: Plastics;  Laterality: Right;   DEBRIDEMENT AND CLOSURE WOUND Left 07/30/2019   Procedure: DEBRIDEMENT LEFT MASTECTOMY FLAP;  Surgeon: Irene Limbo, MD;  Location: [facility];  Service: Plastics;  Laterality: Left;   INDUCED ABORTION  2017   LIPOSUCTION WITH LIPOFILLING Right 05/05/2020   Procedure: LIPOFILLING FROM ABDOMEN TO RIGHT CHEST;  Surgeon: Irene Limbo, MD;  Location: [facility];  Service: Plastics;  Laterality: Right;   MASTECTOMY Left 07/13/2019   MASTOPEXY Right 05/05/2020   Procedure: RIGHT BREAST MASTOPEXY;  Surgeon: Irene Limbo, MD;  Location: [facility];  Service: Plastics;  Laterality: Right;   NIPPLE SPARING MASTECTOMY WITH SENTINEL LYMPH NODE BIOPSY Left 07/13/2019   Procedure: LEFT NIPPLE SPARING MASTECTOMY WITH SENTINEL LYMPH NODE BIOPSY;  Surgeon: Donnie Mesa, MD;  Location: [facility];  Service: General;  Laterality: Left;   PORT-A-CATH REMOVAL Right 09/26/2020   Procedure: REMOVAL RIGHT CHEST PORT;  Surgeon: Irene Limbo, MD;  Location: [facility];  Service: Plastics;  Laterality: Right;   PORTACATH PLACEMENT Right 09/01/2019   Procedure: INSERTION PORT-A-CATH WITH ULTRASOUND GUIDANCE;  Surgeon: Donnie Mesa, MD;  Location: [facility];  Service: General;  Laterality: Right;   REMOVAL OF TISSUE EXPANDER AND PLACEMENT OF IMPLANT Left 05/05/2020    Procedure: REMOVAL OF LEFT CHEST TISSUE EXPANDER AND PLACEMENT OF IMPLANT;  Surgeon: Irene Limbo, MD;  Location: [facility];  Service: Plastics;  Laterality: Left;   TISSUE EXPANDER PLACEMENT Left 07/30/2019   Procedure: TISSUE EXPANSION LEFT CHEST;  Surgeon: Irene Limbo, MD;  Location: [facility];  Service: Plastics;  Laterality: Left;    I have reviewed the social history and family history with the patient and they are unchanged from previous note.  ALLERGIES:  is allergic to bactrim [sulfamethoxazole-trimethoprim].  MEDICATIONS:  Current Outpatient Medications  Medication Sig Dispense Refill   anastrozole (ARIMIDEX) 1 MG tablet Take 1 tablet (1 mg total) by mouth daily. 30 tablet 5   dexlansoprazole (DEXILANT) 60 MG capsule Take 1 capsule by mouth daily.     Lactobacillus-Inulin (CULTURELLE DIGESTIVE HEALTH PO) Take 1 capsule by mouth daily  as needed (digestive health.).      lidocaine-prilocaine (EMLA) cream APPLY 1 APPLICATION TOPICALLY AS NEEDED. 30 g 0   No current facility-administered medications for this visit.    PHYSICAL EXAMINATION: ECOG PERFORMANCE STATUS: 0 - Asymptomatic  Vitals:   04/15/22 0843  BP: 115/82  Pulse: 86  Resp: 17  Temp: 98.3 F (36.8 C)  SpO2: 100%   Wt Readings from Last 3 Encounters:  04/15/22 125 lb (56.7 kg)  12/17/21 125 lb 1.6 oz (56.7 kg)  08/10/21 120 lb 8 oz (54.7 kg)     GENERAL:alert, no distress and comfortable SKIN: skin color, texture, turgor are normal, no rashes or significant lesions EYES: normal, Conjunctiva are pink and non-injected, sclera clear  NECK: supple, thyroid normal size, non-tender, without nodularity LYMPH:  no palpable lymphadenopathy in the cervical, axillary LUNGS: clear to auscultation and percussion with normal breathing effort HEART: regular rate & rhythm and no murmurs and no lower extremity edema ABDOMEN:abdomen soft, non-tender and normal bowel  sounds Musculoskeletal:no cyanosis of digits and no clubbing  NEURO: alert & oriented x 3 with fluent speech, no focal motor/sensory deficits BREAST: No palpable mass, nodules or adenopathy bilaterally. Breast exam benign.   LABORATORY DATA:  I have reviewed the data as listed    Latest Ref Rng & Units 04/15/2022    8:28 AM 12/17/2021    8:20 AM 08/10/2021    1:53 PM  CBC  WBC 4.0 - 10.5 K/uL 5.3  5.5  5.6   Hemoglobin 12.0 - 15.0 g/dL 13.6  13.1  13.1   Hematocrit 36.0 - 46.0 % 40.0  38.1  38.8   Platelets 150 - 400 K/uL 240  238  245         Latest Ref Rng & Units 04/15/2022    8:28 AM 12/17/2021    8:20 AM 08/10/2021    1:53 PM  CMP  Glucose 70 - 99 mg/dL 71  117  97   BUN 6 - 20 mg/dL _0 Creatinine 0.44 - 1.00 mg/dL 0.79  0.80  0.80   Sodium 135 - 145 mmol/L 140  139  139   Potassium 3.5 - 5.1 mmol/L 3.7  4.0  3.9   Chloride 98 - 111 mmol/L 106  105  104   CO2 22 - 32 mmol/L _1 Calcium 8.9 - 10.3 mg/dL 9.4  9.4  9.5   Total Protein 6.5 - 8.1 g/dL 7.0  6.8  7.0   Total Bilirubin 0.3 - 1.2 mg/dL 0.4  0.2  0.4   Alkaline Phos 38 - 126 U/L 77  88  74   AST 15 - 41 U/L _2 ALT 0 - 44 U/L _3 RADIOGRAPHIC STUDIES: I have personally reviewed the radiological images as listed and agreed with the findings in the report. No results found.    No orders of the defined types were placed in this encounter.  All questions were answered. The patient knows to call the clinic with any problems, questions or concerns. No barriers to learning was detected. The total time spent in the appointment was 30 minutes.     Truitt Merle, MD 04/15/2022   I, Wilburn Mylar, am acting as scribe for Truitt Merle, MD.   I have reviewed the above documentation for accuracy and completeness, and I agree with the above.

## 2022-04-28 ENCOUNTER — Encounter: Payer: Self-pay | Admitting: Hematology

## 2022-05-09 ENCOUNTER — Encounter: Payer: Self-pay | Admitting: Hematology

## 2022-05-18 ENCOUNTER — Inpatient Hospital Stay: Payer: Medicaid Other | Attending: Hematology

## 2022-05-18 ENCOUNTER — Other Ambulatory Visit: Payer: Self-pay

## 2022-05-18 VITALS — BP 127/90 | HR 83 | Temp 98.8°F | Resp 16

## 2022-05-18 DIAGNOSIS — Z79899 Other long term (current) drug therapy: Secondary | ICD-10-CM | POA: Diagnosis not present

## 2022-05-18 DIAGNOSIS — C50412 Malignant neoplasm of upper-outer quadrant of left female breast: Secondary | ICD-10-CM | POA: Insufficient documentation

## 2022-05-18 DIAGNOSIS — Z5111 Encounter for antineoplastic chemotherapy: Secondary | ICD-10-CM | POA: Diagnosis present

## 2022-05-18 DIAGNOSIS — Z95828 Presence of other vascular implants and grafts: Secondary | ICD-10-CM

## 2022-05-18 MED ORDER — GOSERELIN ACETATE 3.6 MG ~~LOC~~ IMPL
3.6000 mg | DRUG_IMPLANT | Freq: Once | SUBCUTANEOUS | Status: AC
  Administered 2022-05-18: 3.6 mg via SUBCUTANEOUS

## 2022-05-18 NOTE — Patient Instructions (Signed)
Goserelin Implant What is this medication? GOSERELIN (GOE se rel in) treats prostate cancer and breast cancer. It works by decreasing levels of the hormones testosterone and estrogen in the body. This prevents prostate and breast cancer cells from spreading or growing. It may also be used to treat endometriosis. This is a condition where the tissue that lines the uterus grows outside the uterus. It works by decreasing the amount of estrogen your body makes, which reduces heavy bleeding and pain. It can also be used to help thin the lining of the uterus before a surgery used to prevent or reduce heavy periods. This medicine may be used for other purposes; ask your health care provider or pharmacist if you have questions. COMMON BRAND NAME(S): Zoladex, Zoladex 3-Month What should I tell my care team before I take this medication? They need to know if you have any of these conditions: Bone problems Diabetes Heart disease History of irregular heartbeat or rhythm An unusual or allergic reaction to goserelin, other medications, foods, dyes, or preservatives Pregnant or trying to get pregnant Breastfeeding How should I use this medication? This medication is injected under the skin. It is given by your care team in a hospital or clinic setting. Talk to your care team about the use of this medication in children. Special care may be needed. Overdosage: If you think you have taken too much of this medicine contact a poison control center or emergency room at once. NOTE: This medicine is only for you. Do not share this medicine with others. What if I miss a dose? Keep appointments for follow-up doses. It is important not to miss your dose. Call your care team if you are unable to keep an appointment. What may interact with this medication? Do not take this medication with any of the following: Cisapride Dronedarone Pimozide Thioridazine This medication may also interact with the following: Other  medications that cause heart rhythm changes This list may not describe all possible interactions. Give your health care provider a list of all the medicines, herbs, non-prescription drugs, or dietary supplements you use. Also tell them if you smoke, drink alcohol, or use illegal drugs. Some items may interact with your medicine. What should I watch for while using this medication? Visit your care team for regular checks on your progress. Your symptoms may appear to get worse during the first weeks of this therapy. Tell your care team if your symptoms do not start to get better or if they get worse after this time. Using this medication for a long time may weaken your bones. If you smoke or frequently drink alcohol you may increase your risk of bone loss. A family history of osteoporosis, chronic use of medications for seizures (convulsions), or corticosteroids can also increase your risk of bone loss. The risk of bone fractures may be increased. Talk to your care team about your bone health. This medication may increase blood sugar. The risk may be higher in patients who already have diabetes. Ask your care team what you can do to lower your risk of diabetes while taking this medication. This medication should stop regular monthly menstruation in women. Tell your care team if you continue to menstruate. Talk to your care team if you wish to become pregnant or think you might be pregnant. This medication can cause serious birth defects if taken during pregnancy or for 12 weeks after stopping treatment. Talk to your care team about reliable forms of contraception. Do not breastfeed while taking this   medication. This medication may cause infertility. Talk to your care team if you are concerned about your fertility. What side effects may I notice from receiving this medication? Side effects that you should report to your care team as soon as possible: Allergic reactions--skin rash, itching, hives, swelling  of the face, lips, tongue, or throat Change in the amount of urine Heart attack--pain or tightness in the chest, shoulders, arms, or jaw, nausea, shortness of breath, cold or clammy skin, feeling faint or lightheaded Heart rhythm changes--fast or irregular heartbeat, dizziness, feeling faint or lightheaded, chest pain, trouble breathing High blood sugar (hyperglycemia)--increased thirst or amount of urine, unusual weakness or fatigue, blurry vision High calcium level--increased thirst or amount of urine, nausea, vomiting, confusion, unusual weakness or fatigue, bone pain Pain, redness, irritation, or bruising at the injection site Severe back pain, numbness or weakness of the hands, arms, legs, or feet, loss of coordination, loss of bowel or bladder control Stroke--sudden numbness or weakness of the face, arm, or leg, trouble speaking, confusion, trouble walking, loss of balance or coordination, dizziness, severe headache, change in vision Swelling and pain of the tumor site or lymph nodes Trouble passing urine Side effects that usually do not require medical attention (report to your care team if they continue or are bothersome): Change in sex drive or performance Headache Hot flashes Rapid or extreme change in emotion or mood Sweating Swelling of the ankles, hands, or feet Unusual vaginal discharge, itching, or odor This list may not describe all possible side effects. Call your doctor for medical advice about side effects. You may report side effects to FDA at 1-800-FDA-1088. Where should I keep my medication? This medication is given in a hospital or clinic. It will not be stored at home. NOTE: This sheet is a summary. It may not cover all possible information. If you have questions about this medicine, talk to your doctor, pharmacist, or health care provider.  2023 Elsevier/Gold Standard (2021-10-24 00:00:00)  

## 2022-06-10 ENCOUNTER — Encounter: Payer: Self-pay | Admitting: Physician Assistant

## 2022-06-15 ENCOUNTER — Inpatient Hospital Stay: Payer: Medicaid Other | Attending: Hematology

## 2022-06-15 VITALS — BP 131/90 | HR 88 | Temp 97.9°F | Resp 18

## 2022-06-15 DIAGNOSIS — Z5111 Encounter for antineoplastic chemotherapy: Secondary | ICD-10-CM | POA: Insufficient documentation

## 2022-06-15 DIAGNOSIS — Z95828 Presence of other vascular implants and grafts: Secondary | ICD-10-CM

## 2022-06-15 DIAGNOSIS — Z79899 Other long term (current) drug therapy: Secondary | ICD-10-CM | POA: Insufficient documentation

## 2022-06-15 DIAGNOSIS — C50412 Malignant neoplasm of upper-outer quadrant of left female breast: Secondary | ICD-10-CM | POA: Insufficient documentation

## 2022-06-15 MED ORDER — GOSERELIN ACETATE 3.6 MG ~~LOC~~ IMPL
3.6000 mg | DRUG_IMPLANT | Freq: Once | SUBCUTANEOUS | Status: AC
  Administered 2022-06-15: 3.6 mg via SUBCUTANEOUS

## 2022-06-28 ENCOUNTER — Encounter: Payer: Self-pay | Admitting: Hematology

## 2022-07-13 ENCOUNTER — Ambulatory Visit: Payer: Medicaid Other

## 2022-07-20 ENCOUNTER — Inpatient Hospital Stay: Payer: Medicaid Other | Attending: Hematology

## 2022-07-20 VITALS — BP 119/90 | HR 82 | Temp 97.9°F | Resp 17

## 2022-07-20 DIAGNOSIS — Z79899 Other long term (current) drug therapy: Secondary | ICD-10-CM | POA: Insufficient documentation

## 2022-07-20 DIAGNOSIS — Z5111 Encounter for antineoplastic chemotherapy: Secondary | ICD-10-CM | POA: Diagnosis present

## 2022-07-20 DIAGNOSIS — C50412 Malignant neoplasm of upper-outer quadrant of left female breast: Secondary | ICD-10-CM | POA: Diagnosis present

## 2022-07-20 DIAGNOSIS — Z95828 Presence of other vascular implants and grafts: Secondary | ICD-10-CM

## 2022-07-20 MED ORDER — GOSERELIN ACETATE 3.6 MG ~~LOC~~ IMPL
3.6000 mg | DRUG_IMPLANT | Freq: Once | SUBCUTANEOUS | Status: AC
  Administered 2022-07-20: 3.6 mg via SUBCUTANEOUS
  Filled 2022-07-20: qty 3.6

## 2022-08-10 ENCOUNTER — Inpatient Hospital Stay: Payer: Medicaid Other

## 2022-08-17 ENCOUNTER — Inpatient Hospital Stay: Payer: Medicaid Other | Attending: Hematology

## 2022-08-17 VITALS — BP 120/86 | HR 106 | Temp 97.7°F | Resp 16

## 2022-08-17 DIAGNOSIS — C50412 Malignant neoplasm of upper-outer quadrant of left female breast: Secondary | ICD-10-CM | POA: Insufficient documentation

## 2022-08-17 DIAGNOSIS — Z95828 Presence of other vascular implants and grafts: Secondary | ICD-10-CM

## 2022-08-17 DIAGNOSIS — Z79899 Other long term (current) drug therapy: Secondary | ICD-10-CM | POA: Insufficient documentation

## 2022-08-17 DIAGNOSIS — Z5111 Encounter for antineoplastic chemotherapy: Secondary | ICD-10-CM | POA: Diagnosis present

## 2022-08-17 MED ORDER — GOSERELIN ACETATE 3.6 MG ~~LOC~~ IMPL
3.6000 mg | DRUG_IMPLANT | Freq: Once | SUBCUTANEOUS | Status: AC
  Administered 2022-08-17: 3.6 mg via SUBCUTANEOUS
  Filled 2022-08-17: qty 3.6

## 2022-09-05 ENCOUNTER — Other Ambulatory Visit: Payer: Medicaid Other

## 2022-09-05 ENCOUNTER — Ambulatory Visit: Payer: Medicaid Other

## 2022-09-05 ENCOUNTER — Ambulatory Visit: Payer: Medicaid Other | Admitting: Hematology

## 2022-09-12 NOTE — Assessment & Plan Note (Signed)
invasive ductal carcinoma and DCIS, stage IIA, pT2N0M0, ER+/PR-/HER2+, Grade III -diagnosed in 04/2019, started tamoxifen neoadjvantly 05/26/19. S/p left breast mastectomy 07/13/19 with Dr. Georgette Dover and reconstruction with Dr. Iran Planas. Oncotype RS of 56, but tumor was HER2+ on repeat prognostic panel. -s/p 6 cycles of adjuvant TCH (09/02/19 - 12/16/19) and one year trastuzumab in 08/2020 -She restarted Tamoxifen 12/2019 - 06/2020, but stopped due to nausea and insomnia -She started ovarian suppression Zoladex injections on 06/15/20, and switched to Letrozole in 07/2020.  -She took neratinib 11/09/20 - 12/19/20 but did not tolerate due to GI symptoms -she switched to anastrozole on 01/18/22, and her headaches subsequently resolved and hot flashes improved.

## 2022-09-13 ENCOUNTER — Encounter: Payer: Self-pay | Admitting: Hematology

## 2022-09-13 ENCOUNTER — Other Ambulatory Visit: Payer: Self-pay

## 2022-09-13 ENCOUNTER — Inpatient Hospital Stay (HOSPITAL_BASED_OUTPATIENT_CLINIC_OR_DEPARTMENT_OTHER): Payer: Medicaid Other | Admitting: Hematology

## 2022-09-13 ENCOUNTER — Inpatient Hospital Stay: Payer: Medicaid Other

## 2022-09-13 ENCOUNTER — Inpatient Hospital Stay: Payer: Medicaid Other | Attending: Hematology

## 2022-09-13 VITALS — BP 118/80 | HR 72 | Temp 97.6°F | Resp 18 | Ht 62.0 in | Wt 135.4 lb

## 2022-09-13 DIAGNOSIS — Z79899 Other long term (current) drug therapy: Secondary | ICD-10-CM | POA: Insufficient documentation

## 2022-09-13 DIAGNOSIS — Z170 Estrogen receptor positive status [ER+]: Secondary | ICD-10-CM | POA: Diagnosis not present

## 2022-09-13 DIAGNOSIS — C50412 Malignant neoplasm of upper-outer quadrant of left female breast: Secondary | ICD-10-CM

## 2022-09-13 DIAGNOSIS — Z95828 Presence of other vascular implants and grafts: Secondary | ICD-10-CM

## 2022-09-13 DIAGNOSIS — Z5111 Encounter for antineoplastic chemotherapy: Secondary | ICD-10-CM | POA: Insufficient documentation

## 2022-09-13 LAB — CBC WITH DIFFERENTIAL (CANCER CENTER ONLY)
Abs Immature Granulocytes: 0.01 10*3/uL (ref 0.00–0.07)
Basophils Absolute: 0.1 10*3/uL (ref 0.0–0.1)
Basophils Relative: 1 %
Eosinophils Absolute: 0.1 10*3/uL (ref 0.0–0.5)
Eosinophils Relative: 2 %
HCT: 39.8 % (ref 36.0–46.0)
Hemoglobin: 13.8 g/dL (ref 12.0–15.0)
Immature Granulocytes: 0 %
Lymphocytes Relative: 45 %
Lymphs Abs: 2.4 10*3/uL (ref 0.7–4.0)
MCH: 29.6 pg (ref 26.0–34.0)
MCHC: 34.7 g/dL (ref 30.0–36.0)
MCV: 85.2 fL (ref 80.0–100.0)
Monocytes Absolute: 0.3 10*3/uL (ref 0.1–1.0)
Monocytes Relative: 6 %
Neutro Abs: 2.5 10*3/uL (ref 1.7–7.7)
Neutrophils Relative %: 46 %
Platelet Count: 257 10*3/uL (ref 150–400)
RBC: 4.67 MIL/uL (ref 3.87–5.11)
RDW: 12.9 % (ref 11.5–15.5)
WBC Count: 5.4 10*3/uL (ref 4.0–10.5)
nRBC: 0 % (ref 0.0–0.2)

## 2022-09-13 LAB — CMP (CANCER CENTER ONLY)
ALT: 12 U/L (ref 0–44)
AST: 16 U/L (ref 15–41)
Albumin: 4.2 g/dL (ref 3.5–5.0)
Alkaline Phosphatase: 95 U/L (ref 38–126)
Anion gap: 4 — ABNORMAL LOW (ref 5–15)
BUN: 14 mg/dL (ref 6–20)
CO2: 31 mmol/L (ref 22–32)
Calcium: 9.3 mg/dL (ref 8.9–10.3)
Chloride: 105 mmol/L (ref 98–111)
Creatinine: 0.76 mg/dL (ref 0.44–1.00)
GFR, Estimated: >60 mL/min (ref >60)
Glucose, Bld: 82 mg/dL (ref 70–99)
Potassium: 3.9 mmol/L (ref 3.5–5.1)
Sodium: 140 mmol/L (ref 135–145)
Total Bilirubin: 0.6 mg/dL (ref 0.3–1.2)
Total Protein: 6.9 g/dL (ref 6.5–8.1)

## 2022-09-13 MED ORDER — GOSERELIN ACETATE 3.6 MG ~~LOC~~ IMPL
3.6000 mg | DRUG_IMPLANT | Freq: Once | SUBCUTANEOUS | Status: AC
  Administered 2022-09-13: 3.6 mg via SUBCUTANEOUS
  Filled 2022-09-13: qty 3.6

## 2022-09-13 NOTE — Patient Instructions (Signed)
Goserelin Implant What is this medication? GOSERELIN (GOE se rel in) treats prostate cancer and breast cancer. It works by decreasing levels of the hormones testosterone and estrogen in the body. This prevents prostate and breast cancer cells from spreading or growing. It may also be used to treat endometriosis. This is a condition where the tissue that lines the uterus grows outside the uterus. It works by decreasing the amount of estrogen your body makes, which reduces heavy bleeding and pain. It can also be used to help thin the lining of the uterus before a surgery used to prevent or reduce heavy periods. This medicine may be used for other purposes; ask your health care provider or pharmacist if you have questions. COMMON BRAND NAME(S): Zoladex, Zoladex 3-Month What should I tell my care team before I take this medication? They need to know if you have any of these conditions: Bone problems Diabetes Heart disease History of irregular heartbeat or rhythm An unusual or allergic reaction to goserelin, other medications, foods, dyes, or preservatives Pregnant or trying to get pregnant Breastfeeding How should I use this medication? This medication is injected under the skin. It is given by your care team in a hospital or clinic setting. Talk to your care team about the use of this medication in children. Special care may be needed. Overdosage: If you think you have taken too much of this medicine contact a poison control center or emergency room at once. NOTE: This medicine is only for you. Do not share this medicine with others. What if I miss a dose? Keep appointments for follow-up doses. It is important not to miss your dose. Call your care team if you are unable to keep an appointment. What may interact with this medication? Do not take this medication with any of the following: Cisapride Dronedarone Pimozide Thioridazine This medication may also interact with the following: Other  medications that cause heart rhythm changes This list may not describe all possible interactions. Give your health care provider a list of all the medicines, herbs, non-prescription drugs, or dietary supplements you use. Also tell them if you smoke, drink alcohol, or use illegal drugs. Some items may interact with your medicine. What should I watch for while using this medication? Visit your care team for regular checks on your progress. Your symptoms may appear to get worse during the first weeks of this therapy. Tell your care team if your symptoms do not start to get better or if they get worse after this time. Using this medication for a long time may weaken your bones. If you smoke or frequently drink alcohol you may increase your risk of bone loss. A family history of osteoporosis, chronic use of medications for seizures (convulsions), or corticosteroids can also increase your risk of bone loss. The risk of bone fractures may be increased. Talk to your care team about your bone health. This medication may increase blood sugar. The risk may be higher in patients who already have diabetes. Ask your care team what you can do to lower your risk of diabetes while taking this medication. This medication should stop regular monthly menstruation in women. Tell your care team if you continue to menstruate. Talk to your care team if you wish to become pregnant or think you might be pregnant. This medication can cause serious birth defects if taken during pregnancy or for 12 weeks after stopping treatment. Talk to your care team about reliable forms of contraception. Do not breastfeed while taking this   medication. This medication may cause infertility. Talk to your care team if you are concerned about your fertility. What side effects may I notice from receiving this medication? Side effects that you should report to your care team as soon as possible: Allergic reactions--skin rash, itching, hives, swelling  of the face, lips, tongue, or throat Change in the amount of urine Heart attack--pain or tightness in the chest, shoulders, arms, or jaw, nausea, shortness of breath, cold or clammy skin, feeling faint or lightheaded Heart rhythm changes--fast or irregular heartbeat, dizziness, feeling faint or lightheaded, chest pain, trouble breathing High blood sugar (hyperglycemia)--increased thirst or amount of urine, unusual weakness or fatigue, blurry vision High calcium level--increased thirst or amount of urine, nausea, vomiting, confusion, unusual weakness or fatigue, bone pain Pain, redness, irritation, or bruising at the injection site Severe back pain, numbness or weakness of the hands, arms, legs, or feet, loss of coordination, loss of bowel or bladder control Stroke--sudden numbness or weakness of the face, arm, or leg, trouble speaking, confusion, trouble walking, loss of balance or coordination, dizziness, severe headache, change in vision Swelling and pain of the tumor site or lymph nodes Trouble passing urine Side effects that usually do not require medical attention (report to your care team if they continue or are bothersome): Change in sex drive or performance Headache Hot flashes Rapid or extreme change in emotion or mood Sweating Swelling of the ankles, hands, or feet Unusual vaginal discharge, itching, or odor This list may not describe all possible side effects. Call your doctor for medical advice about side effects. You may report side effects to FDA at 1-800-FDA-1088. Where should I keep my medication? This medication is given in a hospital or clinic. It will not be stored at home. NOTE: This sheet is a summary. It may not cover all possible information. If you have questions about this medicine, talk to your doctor, pharmacist, or health care provider.  2023 Elsevier/Gold Standard (2021-10-24 00:00:00)  
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
## 2022-09-15 ENCOUNTER — Other Ambulatory Visit: Payer: Self-pay | Admitting: Hematology

## 2022-10-19 ENCOUNTER — Inpatient Hospital Stay: Payer: Medicaid Other | Attending: Hematology

## 2022-10-19 VITALS — BP 125/88 | HR 86 | Temp 98.1°F | Resp 20

## 2022-10-19 DIAGNOSIS — C50412 Malignant neoplasm of upper-outer quadrant of left female breast: Secondary | ICD-10-CM | POA: Insufficient documentation

## 2022-10-19 DIAGNOSIS — Z79899 Other long term (current) drug therapy: Secondary | ICD-10-CM | POA: Diagnosis not present

## 2022-10-19 DIAGNOSIS — Z5111 Encounter for antineoplastic chemotherapy: Secondary | ICD-10-CM | POA: Insufficient documentation

## 2022-10-19 DIAGNOSIS — Z95828 Presence of other vascular implants and grafts: Secondary | ICD-10-CM

## 2022-10-19 MED ORDER — GOSERELIN ACETATE 3.6 MG ~~LOC~~ IMPL
3.6000 mg | DRUG_IMPLANT | Freq: Once | SUBCUTANEOUS | Status: AC
  Administered 2022-10-19: 3.6 mg via SUBCUTANEOUS

## 2022-11-04 ENCOUNTER — Telehealth: Payer: Self-pay | Admitting: Hematology

## 2022-11-12 ENCOUNTER — Telehealth: Payer: Self-pay | Admitting: Hematology

## 2022-11-15 ENCOUNTER — Telehealth: Payer: Self-pay | Admitting: Hematology

## 2022-11-15 ENCOUNTER — Ambulatory Visit: Payer: Medicaid Other

## 2022-11-15 ENCOUNTER — Encounter: Payer: Self-pay | Admitting: Hematology

## 2022-11-15 ENCOUNTER — Inpatient Hospital Stay: Payer: Medicaid Other

## 2022-11-16 ENCOUNTER — Ambulatory Visit: Payer: Medicaid Other

## 2022-11-20 ENCOUNTER — Inpatient Hospital Stay: Payer: Medicaid Other | Attending: Hematology

## 2022-11-20 ENCOUNTER — Other Ambulatory Visit: Payer: Self-pay | Admitting: Nurse Practitioner

## 2022-11-20 DIAGNOSIS — Z5111 Encounter for antineoplastic chemotherapy: Secondary | ICD-10-CM | POA: Insufficient documentation

## 2022-11-20 DIAGNOSIS — C50412 Malignant neoplasm of upper-outer quadrant of left female breast: Secondary | ICD-10-CM | POA: Insufficient documentation

## 2022-11-20 DIAGNOSIS — Z79899 Other long term (current) drug therapy: Secondary | ICD-10-CM | POA: Diagnosis not present

## 2022-11-20 DIAGNOSIS — T451X5A Adverse effect of antineoplastic and immunosuppressive drugs, initial encounter: Secondary | ICD-10-CM

## 2022-11-20 DIAGNOSIS — Z95828 Presence of other vascular implants and grafts: Secondary | ICD-10-CM

## 2022-11-20 DIAGNOSIS — Z170 Estrogen receptor positive status [ER+]: Secondary | ICD-10-CM

## 2022-11-20 MED ORDER — VEOZAH 45 MG PO TABS
45.0000 mg | ORAL_TABLET | Freq: Every day | ORAL | 3 refills | Status: AC
Start: 2022-11-20 — End: ?

## 2022-11-20 MED ORDER — GOSERELIN ACETATE 3.6 MG ~~LOC~~ IMPL
3.6000 mg | DRUG_IMPLANT | Freq: Once | SUBCUTANEOUS | Status: AC
  Administered 2022-11-20: 3.6 mg via SUBCUTANEOUS
  Filled 2022-11-20: qty 3.6

## 2022-11-20 NOTE — Progress Notes (Signed)
I spoke to Victoria Barker about medications for hot flashes. She takes anastrozole at night to ease day time HF's. This helped until it became warmer outside now HF's are intense again. She has tried gabapentin and effexor in the past without much effect. She is open to veozah. I reviewed potential SE's. Scr and LFTs normal on recent labs. She will start when she picks it up. Knows to stop and call us if she develops SE's.   Santiago Glad, NP

## 2022-11-26 ENCOUNTER — Telehealth: Payer: Self-pay

## 2022-11-26 NOTE — Telephone Encounter (Signed)
Received phone call from Patient requesting that a prior authorization request be submitted to [facility]) to cover the copayment for her Veozah 45mg  tablets. Bonna Gains is Patient's secondary insurance and previously denied a prior authorization request. Monia Pouch is Patient's primary insurance and approved Veozah. Patient stated that she called [facility] and that the copayment will be covered if another prior authorization request is submitted. Prior authorization request submitted via CoverMyMeds as requested by Patient.

## 2022-11-26 NOTE — Telephone Encounter (Signed)
Notified Patient of prior authorization approval for Veozah 45mg  Tablets. Medication is approved through 11/26/2023. No other needs or concerns voiced at this time.

## 2022-11-27 NOTE — Telephone Encounter (Signed)
PA was approved on 11/26/22. I am currently awaiting approval from her secondary Insurance for the co-payment. Patient is aware.

## 2022-12-14 ENCOUNTER — Ambulatory Visit: Payer: Medicaid Other

## 2022-12-17 ENCOUNTER — Other Ambulatory Visit: Payer: Self-pay | Admitting: Hematology

## 2022-12-21 ENCOUNTER — Inpatient Hospital Stay: Payer: Medicaid Other | Attending: Hematology

## 2022-12-21 VITALS — BP 124/81 | HR 88 | Temp 98.1°F | Resp 20

## 2022-12-21 DIAGNOSIS — Z5111 Encounter for antineoplastic chemotherapy: Secondary | ICD-10-CM | POA: Insufficient documentation

## 2022-12-21 DIAGNOSIS — Z95828 Presence of other vascular implants and grafts: Secondary | ICD-10-CM

## 2022-12-21 DIAGNOSIS — Z170 Estrogen receptor positive status [ER+]: Secondary | ICD-10-CM | POA: Diagnosis not present

## 2022-12-21 DIAGNOSIS — C50412 Malignant neoplasm of upper-outer quadrant of left female breast: Secondary | ICD-10-CM | POA: Diagnosis present

## 2022-12-21 MED ORDER — GOSERELIN ACETATE 3.6 MG ~~LOC~~ IMPL
3.6000 mg | DRUG_IMPLANT | Freq: Once | SUBCUTANEOUS | Status: AC
  Administered 2022-12-21: 3.6 mg via SUBCUTANEOUS
  Filled 2022-12-21: qty 3.6

## 2023-01-11 ENCOUNTER — Ambulatory Visit: Payer: Medicaid Other

## 2023-01-18 ENCOUNTER — Inpatient Hospital Stay: Payer: Medicaid Other | Attending: Hematology

## 2023-01-18 VITALS — BP 122/84 | HR 86 | Temp 98.9°F | Resp 16

## 2023-01-18 DIAGNOSIS — Z5111 Encounter for antineoplastic chemotherapy: Secondary | ICD-10-CM | POA: Diagnosis not present

## 2023-01-18 DIAGNOSIS — Z79899 Other long term (current) drug therapy: Secondary | ICD-10-CM | POA: Diagnosis not present

## 2023-01-18 DIAGNOSIS — C50412 Malignant neoplasm of upper-outer quadrant of left female breast: Secondary | ICD-10-CM | POA: Insufficient documentation

## 2023-01-18 DIAGNOSIS — Z95828 Presence of other vascular implants and grafts: Secondary | ICD-10-CM

## 2023-01-18 MED ORDER — GOSERELIN ACETATE 3.6 MG ~~LOC~~ IMPL
3.6000 mg | DRUG_IMPLANT | Freq: Once | SUBCUTANEOUS | Status: AC
  Administered 2023-01-18: 3.6 mg via SUBCUTANEOUS

## 2023-02-15 ENCOUNTER — Ambulatory Visit: Payer: Medicaid Other

## 2023-02-22 ENCOUNTER — Other Ambulatory Visit: Payer: Self-pay

## 2023-02-22 ENCOUNTER — Inpatient Hospital Stay: Payer: Medicaid Other | Attending: Hematology

## 2023-02-22 VITALS — BP 138/85 | HR 77 | Temp 97.5°F | Resp 14

## 2023-02-22 DIAGNOSIS — C50412 Malignant neoplasm of upper-outer quadrant of left female breast: Secondary | ICD-10-CM | POA: Insufficient documentation

## 2023-02-22 DIAGNOSIS — Z5111 Encounter for antineoplastic chemotherapy: Secondary | ICD-10-CM | POA: Diagnosis present

## 2023-02-22 DIAGNOSIS — Z79899 Other long term (current) drug therapy: Secondary | ICD-10-CM | POA: Insufficient documentation

## 2023-02-22 DIAGNOSIS — Z95828 Presence of other vascular implants and grafts: Secondary | ICD-10-CM

## 2023-02-22 MED ORDER — GOSERELIN ACETATE 3.6 MG ~~LOC~~ IMPL
3.6000 mg | DRUG_IMPLANT | Freq: Once | SUBCUTANEOUS | Status: AC
  Administered 2023-02-22: 3.6 mg via SUBCUTANEOUS

## 2023-02-22 NOTE — Patient Instructions (Signed)
Goserelin Implant What is this medication? GOSERELIN (GOE se rel in) treats prostate cancer and breast cancer. It works by decreasing levels of the hormones testosterone and estrogen in the body. This prevents prostate and breast cancer cells from spreading or growing. It may also be used to treat endometriosis. This is a condition where the tissue that lines the uterus grows outside the uterus. It works by decreasing the amount of estrogen your body makes, which reduces heavy bleeding and pain. It can also be used to help thin the lining of the uterus before a surgery used to prevent or reduce heavy periods. This medicine may be used for other purposes; ask your health care provider or pharmacist if you have questions. COMMON BRAND NAME(S): Zoladex, Zoladex 3-Month What should I tell my care team before I take this medication? They need to know if you have any of these conditions: Bone problems Diabetes Heart disease History of irregular heartbeat or rhythm An unusual or allergic reaction to goserelin, other medications, foods, dyes, or preservatives Pregnant or trying to get pregnant Breastfeeding How should I use this medication? This medication is injected under the skin. It is given by your care team in a hospital or clinic setting. Talk to your care team about the use of this medication in children. Special care may be needed. Overdosage: If you think you have taken too much of this medicine contact a poison control center or emergency room at once. NOTE: This medicine is only for you. Do not share this medicine with others. What if I miss a dose? Keep appointments for follow-up doses. It is important not to miss your dose. Call your care team if you are unable to keep an appointment. What may interact with this medication? Do not take this medication with any of the following: Cisapride Dronedarone Pimozide Thioridazine This medication may also interact with the following: Other  medications that cause heart rhythm changes This list may not describe all possible interactions. Give your health care provider a list of all the medicines, herbs, non-prescription drugs, or dietary supplements you use. Also tell them if you smoke, drink alcohol, or use illegal drugs. Some items may interact with your medicine. What should I watch for while using this medication? Visit your care team for regular checks on your progress. Your symptoms may appear to get worse during the first weeks of this therapy. Tell your care team if your symptoms do not start to get better or if they get worse after this time. Using this medication for a long time may weaken your bones. If you smoke or frequently drink alcohol you may increase your risk of bone loss. A family history of osteoporosis, chronic use of medications for seizures (convulsions), or corticosteroids can also increase your risk of bone loss. The risk of bone fractures may be increased. Talk to your care team about your bone health. This medication may increase blood sugar. The risk may be higher in patients who already have diabetes. Ask your care team what you can do to lower your risk of diabetes while taking this medication. This medication should stop regular monthly menstruation in women. Tell your care team if you continue to menstruate. Talk to your care team if you wish to become pregnant or think you might be pregnant. This medication can cause serious birth defects if taken during pregnancy or for 12 weeks after stopping treatment. Talk to your care team about reliable forms of contraception. Do not breastfeed while taking this   medication. This medication may cause infertility. Talk to your care team if you are concerned about your fertility. What side effects may I notice from receiving this medication? Side effects that you should report to your care team as soon as possible: Allergic reactions--skin rash, itching, hives, swelling  of the face, lips, tongue, or throat Change in the amount of urine Heart attack--pain or tightness in the chest, shoulders, arms, or jaw, nausea, shortness of breath, cold or clammy skin, feeling faint or lightheaded Heart rhythm changes--fast or irregular heartbeat, dizziness, feeling faint or lightheaded, chest pain, trouble breathing High blood sugar (hyperglycemia)--increased thirst or amount of urine, unusual weakness or fatigue, blurry vision High calcium level--increased thirst or amount of urine, nausea, vomiting, confusion, unusual weakness or fatigue, bone pain Pain, redness, irritation, or bruising at the injection site Severe back pain, numbness or weakness of the hands, arms, legs, or feet, loss of coordination, loss of bowel or bladder control Stroke--sudden numbness or weakness of the face, arm, or leg, trouble speaking, confusion, trouble walking, loss of balance or coordination, dizziness, severe headache, change in vision Swelling and pain of the tumor site or lymph nodes Trouble passing urine Side effects that usually do not require medical attention (report to your care team if they continue or are bothersome): Change in sex drive or performance Headache Hot flashes Rapid or extreme change in emotion or mood Sweating Swelling of the ankles, hands, or feet Unusual vaginal discharge, itching, or odor This list may not describe all possible side effects. Call your doctor for medical advice about side effects. You may report side effects to FDA at 1-800-FDA-1088. Where should I keep my medication? This medication is given in a hospital or clinic. It will not be stored at home. NOTE: This sheet is a summary. It may not cover all possible information. If you have questions about this medicine, talk to your doctor, pharmacist, or health care provider.  2023 Elsevier/Gold Standard (2021-10-24 00:00:00)  

## 2023-03-03 ENCOUNTER — Telehealth: Payer: Self-pay | Admitting: Hematology

## 2023-03-13 ENCOUNTER — Other Ambulatory Visit: Payer: Medicaid Other

## 2023-03-13 ENCOUNTER — Ambulatory Visit: Payer: Medicaid Other | Admitting: Hematology

## 2023-03-13 ENCOUNTER — Ambulatory Visit: Payer: Medicaid Other

## 2023-03-20 ENCOUNTER — Ambulatory Visit: Payer: Medicaid Other | Admitting: Hematology

## 2023-03-20 ENCOUNTER — Ambulatory Visit: Payer: Medicaid Other

## 2023-03-20 ENCOUNTER — Other Ambulatory Visit: Payer: Medicaid Other

## 2023-03-23 NOTE — Assessment & Plan Note (Signed)
invasive ductal carcinoma and DCIS, stage IIA, pT2N0M0, ER+/PR-/HER2+, Grade III -diagnosed in 04/2019, started tamoxifen neoadjvantly 05/26/19. S/p left breast mastectomy 07/13/19 with Dr. Georgette Dover and reconstruction with Dr. Iran Planas. Oncotype RS of 56, but tumor was HER2+ on repeat prognostic panel. -s/p 6 cycles of adjuvant TCH (09/02/19 - 12/16/19) and one year trastuzumab in 08/2020 -She restarted Tamoxifen 12/2019 - 06/2020, but stopped due to nausea and insomnia -She started ovarian suppression Zoladex injections on 06/15/20, and switched to Letrozole in 07/2020.  -She took neratinib 11/09/20 - 12/19/20 but did not tolerate due to GI symptoms -she switched to anastrozole on 01/18/22, and her headaches subsequently resolved and hot flashes improved.

## 2023-03-24 ENCOUNTER — Inpatient Hospital Stay: Payer: No Typology Code available for payment source

## 2023-03-24 ENCOUNTER — Encounter: Payer: Self-pay | Admitting: Hematology

## 2023-03-24 ENCOUNTER — Inpatient Hospital Stay (HOSPITAL_BASED_OUTPATIENT_CLINIC_OR_DEPARTMENT_OTHER): Payer: Medicaid Other | Admitting: Hematology

## 2023-03-24 ENCOUNTER — Other Ambulatory Visit: Payer: Self-pay

## 2023-03-24 ENCOUNTER — Inpatient Hospital Stay: Payer: No Typology Code available for payment source | Attending: Hematology

## 2023-03-24 VITALS — BP 127/85 | HR 73 | Temp 99.1°F | Resp 17 | Ht 62.0 in | Wt 152.5 lb

## 2023-03-24 DIAGNOSIS — R232 Flushing: Secondary | ICD-10-CM

## 2023-03-24 DIAGNOSIS — Z79899 Other long term (current) drug therapy: Secondary | ICD-10-CM | POA: Insufficient documentation

## 2023-03-24 DIAGNOSIS — Z5111 Encounter for antineoplastic chemotherapy: Secondary | ICD-10-CM | POA: Insufficient documentation

## 2023-03-24 DIAGNOSIS — Z1231 Encounter for screening mammogram for malignant neoplasm of breast: Secondary | ICD-10-CM

## 2023-03-24 DIAGNOSIS — Z95828 Presence of other vascular implants and grafts: Secondary | ICD-10-CM

## 2023-03-24 DIAGNOSIS — T451X5A Adverse effect of antineoplastic and immunosuppressive drugs, initial encounter: Secondary | ICD-10-CM

## 2023-03-24 DIAGNOSIS — C50412 Malignant neoplasm of upper-outer quadrant of left female breast: Secondary | ICD-10-CM | POA: Diagnosis not present

## 2023-03-24 DIAGNOSIS — Z170 Estrogen receptor positive status [ER+]: Secondary | ICD-10-CM | POA: Diagnosis not present

## 2023-03-24 DIAGNOSIS — D0512 Intraductal carcinoma in situ of left breast: Secondary | ICD-10-CM

## 2023-03-24 LAB — CMP (CANCER CENTER ONLY)
ALT: 12 U/L (ref 0–44)
AST: 16 U/L (ref 15–41)
Albumin: 4 g/dL (ref 3.5–5.0)
Alkaline Phosphatase: 84 U/L (ref 38–126)
Anion gap: 4 — ABNORMAL LOW (ref 5–15)
BUN: 12 mg/dL (ref 6–20)
CO2: 30 mmol/L (ref 22–32)
Calcium: 9.4 mg/dL (ref 8.9–10.3)
Chloride: 105 mmol/L (ref 98–111)
Creatinine: 0.82 mg/dL (ref 0.44–1.00)
GFR, Estimated: >60 mL/min (ref >60)
Glucose, Bld: 91 mg/dL (ref 70–99)
Potassium: 4 mmol/L (ref 3.5–5.1)
Sodium: 139 mmol/L (ref 135–145)
Total Bilirubin: 0.4 mg/dL (ref 0.3–1.2)
Total Protein: 6.6 g/dL (ref 6.5–8.1)

## 2023-03-24 LAB — CBC WITH DIFFERENTIAL (CANCER CENTER ONLY)
Abs Immature Granulocytes: 0.01 10*3/uL (ref 0.00–0.07)
Basophils Absolute: 0.1 10*3/uL (ref 0.0–0.1)
Basophils Relative: 1 %
Eosinophils Absolute: 0.1 10*3/uL (ref 0.0–0.5)
Eosinophils Relative: 2 %
HCT: 37.5 % (ref 36.0–46.0)
Hemoglobin: 13 g/dL (ref 12.0–15.0)
Immature Granulocytes: 0 %
Lymphocytes Relative: 48 %
Lymphs Abs: 2.7 10*3/uL (ref 0.7–4.0)
MCH: 29.7 pg (ref 26.0–34.0)
MCHC: 34.7 g/dL (ref 30.0–36.0)
MCV: 85.8 fL (ref 80.0–100.0)
Monocytes Absolute: 0.3 10*3/uL (ref 0.1–1.0)
Monocytes Relative: 5 %
Neutro Abs: 2.5 10*3/uL (ref 1.7–7.7)
Neutrophils Relative %: 44 %
Platelet Count: 239 10*3/uL (ref 150–400)
RBC: 4.37 MIL/uL (ref 3.87–5.11)
RDW: 12.7 % (ref 11.5–15.5)
WBC Count: 5.6 10*3/uL (ref 4.0–10.5)
nRBC: 0 % (ref 0.0–0.2)

## 2023-03-24 MED ORDER — VEOZAH 45 MG PO TABS: 45.0000 mg | ORAL_TABLET | Freq: Every day | ORAL | 5 refills | Status: DC

## 2023-03-24 MED ORDER — ANASTROZOLE 1 MG PO TABS: 1.0000 mg | ORAL_TABLET | Freq: Every day | ORAL | 1 refills | Status: DC

## 2023-03-24 MED ORDER — GOSERELIN ACETATE 3.6 MG ~~LOC~~ IMPL
3.6000 mg | DRUG_IMPLANT | Freq: Once | SUBCUTANEOUS | Status: AC
  Administered 2023-03-24: 3.6 mg via SUBCUTANEOUS
  Filled 2023-03-24: qty 3.6

## 2023-03-24 NOTE — Progress Notes (Signed)
[facility]   Telephone:([phone]) [phone] Fax:([phone]) [phone]   Clinic Follow up Note   Patient Care Team: Silas Flood, MD as PCP - General (Obstetrics and Gynecology) Pershing Proud, RN as Oncology Nurse Navigator Donnelly Angelica, RN as Oncology Nurse Navigator Manus Rudd, MD as Consulting Physician (General Surgery) Malachy Mood, MD as Consulting Physician (Hematology) Lonie Peak, MD as Attending Physician (Radiation Oncology) [facility] as Consulting Physician (Diagnostic Radiology)  Date of Service:  03/24/2023  CHIEF COMPLAINT: f/u of left breast cancer  CURRENT THERAPY:  Zoladex monthly Letrozole 2.5 mg daily since February 2022  Oncology History   Malignant neoplasm of upper-outer quadrant of left breast in female, 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 S/p left breast mastectomy 07/13/19 with Dr. Corliss Skains and reconstruction with Dr. Leta Baptist. Oncotype RS of 56, but tumor was HER2+ on repeat prognostic panel. -s/p 6 cycles of adjuvant TCH (09/02/19 - 12/16/19) and one year trastuzumab in 08/2020 -She restarted Tamoxifen 12/2019 - 06/2020, but stopped due to nausea and insomnia -She started ovarian suppression Zoladex injections on 06/15/20, and switched to Letrozole in 07/2020.  -She took neratinib 11/09/20 - 12/19/20 but did not tolerate due to GI symptoms -she switched to anastrozole on 01/18/22, and her headaches subsequently resolved and hot flashes improved.     Assessment and Plan    Breast Cancer Follow-up Patient is close to 4 years post mastectomy and reconstruction for left breast cancer. Currently on letrozole and monthly Zoladex injections. Patient tolerating letrozole well and reports Veozah is effective for hot flashes. Patient plans to undergo  oophorectomy next year. -Continue letrozole and monthly Zoladex injections. -I discussed the role of breast MRI for cancer screening, due to her high breast density.  I will order breast MRI in 3-4 weeks for cancer screening, also to evaluate new palpable nodule in right breast (likely benign). -Order screening mammogram for January 2025. -Follow-up in 6 months.  Oophorectomy Planning Patient plans to undergo oophorectomy next year. Discussed that Anastrozole will need to continue for a total of 7 years from start date, but Zoladex injections can be discontinued after surgery. -pt plan for oophorectomy next year during patient's vacation time. -Discontinue Zoladex injections after surgery.  Hot Flashes Patient reports hot flashes are well controlled with Veozah. -Continue Veozah as needed for hot flashes. -Provide refill for Veozah.      Plan -Patient is doing well, no concern for recurrence -Continue letrozole and monthly Zoladex injection until her BSO in the next spring -Lab and follow-up in 6 months   SUMMARY OF ONCOLOGIC HISTORY: Oncology History Overview Note  Cancer Staging Malignant neoplasm of upper-outer quadrant of left breast in female, estrogen receptor positive ([name]) Staging form: Breast, AJCC 8th Edition - Clinical stage from 04/28/2019: Stage IIA (cT2, cN0, cM0, G2, ER+, PR-, HER2: Equivocal) - Signed by Malachy Mood, MD on 05/04/2019 - Pathologic stage from 07/13/2019: Stage IIA (pT2, pN0, cM0, G3, ER+, PR-, HER2+, Oncotype DX score: 56) - Signed by Malachy Mood, MD on 08/20/2019    Malignant neoplasm of upper-outer quadrant of left breast in female, estrogen receptor positive ([name])  04/21/2019 Mammogram   Diagnostic mammogram 04/21/19  IMPRESSION 1. findings highly suspicious for left breast cancer. There is a palpable irregular mass 4cm from nipple measuring 2.4x2.4x1.8cm in the 1:00 position axis of the left breast and  pleomorphic  calcifications in the upper-outer  quadrant as described above.  2. single left 0.9x0.5cm axillary lymph node with a mild/borderline thickened cortex.    04/28/2019 Cancer Staging   Staging form: Breast, AJCC 8th Edition - Clinical stage from 04/28/2019: Stage IIA (cT2, cN0, cM0, G2, ER+, PR-, HER2: Equivocal) - Signed by Malachy Mood, MD on 05/04/2019   04/28/2019 Initial Biopsy   Diagnosis 04/28/19 1. Breast, left, needle core biopsy, 1 o'clock position - INVASIVE DUCTAL CARCINOMA. - DUCTAL CARCINOMA IN SITU. - SEE COMMENT. 2. Lymph node, needle/core biopsy, left axilla - THERE IS NO EVIDENCE OF CARCINOMA IN 1 OF 1 LYMPH NODE (0/1). 3. Breast, left, needle core biopsy, upper, outer quadrant - DUCTAL CARCINOMA IN SITU WITH CALCIFICATIONS. - SEE COMMENT.   04/28/2019 Receptors her2   1. PROGNOSTIC INDICATORS Results: HER2 negative  Estrogen Receptor: 80%, POSITIVE, STRONG STAINING INTENSITY Progesterone Receptor: 0%, NEGATIVE Proliferation Marker Ki67: 35%    04/30/2019 Initial Diagnosis   Malignant neoplasm of upper-outer quadrant of left breast in female, estrogen receptor positive ([name])    Genetic Testing   No pathogenic variants identified. 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 The final report date is 05/28/2019.  The STAT Breast cancer panel offered by [facility] includes sequencing and rearrangement analysis for the following 9 genes:  ATM, BRCA1, BRCA2, CDH1, CHEK2, PALB2, PTEN, STK11 and TP53.    The Common Hereditary Cancers Panel offered by [facility] includes sequencing and/or deletion duplication testing of the following 48 genes: APC, ATM, AXIN2, BARD1, BMPR1A, BRCA1, BRCA2, BRIP1, CDH1, CDKN2A (p14ARF), CDKN2A (p16INK4a), CKD4, CHEK2, CTNNA1, DICER1, EPCAM (Deletion/duplication testing only), GREM1 (promoter region deletion/duplication testing only), KIT, MEN1, MLH1, MSH2, MSH3, MSH6, MUTYH, NBN, NF1, NHTL1, PALB2, PDGFRA, PMS2, POLD1,  POLE, PTEN, RAD50, RAD51C, RAD51D, RNF43, SDHB, SDHC, SDHD, SMAD4, SMARCA4. STK11, TP53, TSC1, TSC2, and VHL.  The following genes were evaluated for sequence changes only: SDHA and HOXB13 c.251G>A variant only.   05/11/2019 Breast MRI   IMPRESSION: 1. Enhancing mass and architectural distortion spanning approximately 6 x 3 x 5.3 cm in the upper outer left breast consistent with the patient's biopsy-proven site of invasive cancer. 2. Biopsy changes in the anterior left breast at the site of the patient's biopsy-proven ductal carcinoma in situ. 3. Suspicious changes within the upper-outer quadrant of the left breast spanning a total of 8 cm in the AP dimension. This includes the site of biopsy-proven DCIS anteriorly and suspicious enhancement extending to the level of the pectoralis muscle posteriorly. 4. Suspicious 7 mm enhancing mass in the far posterior slightly lateral left breast (image 138/212). 5. No MRI evidence of malignancy on the right. 6. No suspicious lymphadenopathy.     05/26/2019 -  Anti-estrogen oral therapy   Tamoxifen 20mg  daily starting 05/26/19. Stopped before surgery and chemo on 07/13/19. Restart in 12/2019. Due to insomnia and nausea, reduced dose to 10 in 05/2020.  ---------Started her on Ovarian suppression Zoladex on 06/15/20.  ----------Switched to Letrozole in 07/2020.    07/13/2019 Surgery   LEFT NIPPLE SPARING MASTECTOMY WITH SENTINEL LYMPH NODE BIOPSY and LEFT BREAST RECONSTRUCTION WITH PLACEMENT OF TISSUE EXPANDER AND ALLODERM by D.r Tsuie and Dr Leta Baptist.    07/13/2019 Pathology Results   SURGICAL PATHOLOGY   FINAL MICROSCOPIC DIAGNOSIS:   A. LYMPH NODE, LEFT AXILLARY #1, SENTINEL, BIOPSY:  - One of one lymph nodes negative for carcinoma (0/1).   B. BREAST, LEFT, MASTECTOMY:  -  Invasive ductal carcinoma, grade 3, spanning 3.4 cm.  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  - Invasive carcinoma is 0.2 cm from the posterior margin focally  (not on  ink) and 0.4 cm from the  anterior margin focally (not on ink).  - In situ carcinoma is 0.4 cm from the posterior margin focally (not on  ink).  - Biopsy site x2.  - See oncology table.   C. NIPPLE, LEFT, BIOPSY:  - Benign nipple tissue.   D. LYMPH NODE, LEFT AXILLARY #2, SENTINEL, BIOPSY:  - One of one lymph nodes negative for carcinoma (0/1).     GROUP 1:  HER2 **POSITIVE**    07/13/2019 Oncotype testing   Ocotype score 56 with 39% risk of recurrence with Tmaoxifen or AI alone. There is a 15% benefit from adjuvant chemo.     07/13/2019 Cancer Staging   Staging form: Breast, AJCC 8th Edition - Pathologic stage from 07/13/2019: Stage IIA (pT2, pN0, cM0, G3, ER+, PR-, HER2+, Oncotype DX score: 56) - Signed by Malachy Mood, MD on 08/20/2019   08/25/2019 Echocardiogram   Baseline Echo  IMPRESSIONS     1. Left ventricular ejection fraction, by estimation, is 65 to 70%. The  left ventricle has normal function. The left ventricle has no regional  wall motion abnormalities. Left ventricular diastolic parameters were  normal.   2. Right ventricular systolic function is normal. The right ventricular  size is normal. There is normal pulmonary artery systolic pressure.   3. Left atrial size was mildly dilated.   4. The mitral valve is normal in structure and function. Trivial mitral  valve regurgitation.   5. The aortic valve is tricuspid. Aortic valve regurgitation is not  visualized.   6. The inferior vena cava is normal in size with greater than 50%  respiratory variability, suggesting right atrial pressure of 3 mmHg.   7. The aortic valve is not well seen. It is trileaflet. There appears to  be some calcification associated with the non-coronary cusp but this is  not well seen.    08/25/2019 Imaging   Whole body Bone Scan  IMPRESSION: No significant abnormality identified.   08/25/2019 Imaging   CT CAP w Contrast  IMPRESSION: 1. No findings of metastatic disease to the  chest, abdomen, or pelvis. 2. 0.7 by 0.3 cm sclerotic lesion in the right seventh rib laterally is likely benign but merits surveillance. 3. Mild sclerosis along the sacroiliac joints bilaterally, query mild chronic sacroiliitis.     09/01/2019 Procedure   INSERTION PORT-A-CATH WITH ULTRASOUND GUIDANCE by Dr. Corliss Skains   09/02/2019 -  Chemotherapy   TCH (Taxol, Carboplatin, Herceptin) q3weeks for 6 cycles starting 09/02/19-12/16/19, followed by anti-HER2 maintenance therapy with Herceptin q3weeks starting 01/06/20 to complete 1 year of treatment (from 08/2019)     05/05/2020 Surgery   REMOVAL OF LEFT CHEST TISSUE EXPANDER AND PLACEMENT OF IMPLANT and RIGHT BREAST MASTOPEXY and LIPOFILLING FROM ABDOMEN TO RIGHT CHEST by Dr Leta Baptist   11/09/2020 - 12/19/2020 Chemotherapy   Neratinib, stopped due to severe nausea/vomiting    Invasive ductal carcinoma of breast, female, left ([name])  07/13/2019 Initial Diagnosis   Invasive ductal carcinoma of breast, female, left ([name])   11/09/2020 - 12/19/2020 Chemotherapy   Neratinib, stopped due to severe nausea/vomiting       Discussed the use of AI scribe software for clinical note transcription with the patient, who gave verbal consent to proceed.  History of Present Illness   The patient, a  42 year old female with a history of breast cancer, presents for a routine follow-up. She reports no new health issues since her last visit six months ago. She has been discussing potential surgery with her OB-GYN and is considering scheduling it for next year during her vacation time.  She has not noticed any side effects from the [facility]. She also mentions a nodule in her right breast, which she has not noticed before. She has a history of dense breast tissue and has previously had a mammogram that showed several cysts in the right breast.  The patient also discusses her plans to schedule an appointment with her gynecologist and her concerns about a potential future  MRI due to claustrophobia. She is currently using a membership card for her medication copay, which costs her thirty dollars a month.         All other systems were reviewed with the patient and are negative.  MEDICAL HISTORY:  Past Medical History:  Diagnosis Date   Breast cancer ([name]) 2020   Left Mastectomy   Cancer ([name])    Left breast Ca   GERD (gastroesophageal reflux disease)    Personal history of chemotherapy 08/2019-11/2019   Scoliosis    Sickle cell trait ([name])     SURGICAL HISTORY: Past Surgical History:  Procedure Laterality Date   BREAST IMPLANT EXCHANGE Left 09/26/2020   Procedure: REVISION LEFT BREAST RECONSTRUCTION WITH SILICONE IMPLANT EXCHANGE;  Surgeon: Glenna Fellows, MD;  Location: [facility];  Service: Plastics;  Laterality: Left;   BREAST RECONSTRUCTION WITH PLACEMENT OF TISSUE EXPANDER AND ALLODERM Left 07/13/2019   Procedure: LEFT BREAST RECONSTRUCTION WITH PLACEMENT OF TISSUE EXPANDER AND ALLODERM;  Surgeon: Glenna Fellows, MD;  Location: [facility];  Service: Plastics;  Laterality: Left;   BREAST REDUCTION SURGERY Right 09/26/2020   Procedure: REVISION RIGHT BREAST REDUCTION;  Surgeon: Glenna Fellows, MD;  Location: [facility];  Service: Plastics;  Laterality: Right;   DEBRIDEMENT AND CLOSURE WOUND Left 07/30/2019   Procedure: DEBRIDEMENT LEFT MASTECTOMY FLAP;  Surgeon: Glenna Fellows, MD;  Location: [facility];  Service: Plastics;  Laterality: Left;   INDUCED ABORTION  2017   LIPOSUCTION WITH LIPOFILLING Right 05/05/2020   Procedure: LIPOFILLING FROM ABDOMEN TO RIGHT CHEST;  Surgeon: Glenna Fellows, MD;  Location: [facility];  Service: Plastics;  Laterality: Right;   MASTECTOMY Left 07/13/2019   MASTOPEXY Right 05/05/2020   Procedure: RIGHT BREAST MASTOPEXY;  Surgeon: Glenna Fellows, MD;  Location: [facility];  Service: Plastics;  Laterality: Right;   NIPPLE SPARING  MASTECTOMY WITH SENTINEL LYMPH NODE BIOPSY Left 07/13/2019   Procedure: LEFT NIPPLE SPARING MASTECTOMY WITH SENTINEL LYMPH NODE BIOPSY;  Surgeon: Manus Rudd, MD;  Location: [facility];  Service: General;  Laterality: Left;   PORT-A-CATH REMOVAL Right 09/26/2020   Procedure: REMOVAL RIGHT CHEST PORT;  Surgeon: Glenna Fellows, MD;  Location: [facility];  Service: Plastics;  Laterality: Right;   PORTACATH PLACEMENT Right 09/01/2019   Procedure: INSERTION PORT-A-CATH WITH ULTRASOUND GUIDANCE;  Surgeon: Manus Rudd, MD;  Location: [facility];  Service: General;  Laterality: Right;   REMOVAL OF TISSUE EXPANDER AND PLACEMENT OF IMPLANT Left 05/05/2020   Procedure: REMOVAL OF LEFT CHEST TISSUE EXPANDER AND PLACEMENT OF IMPLANT;  Surgeon: Glenna Fellows, MD;  Location: [facility];  Service: Plastics;  Laterality: Left;   TISSUE EXPANDER PLACEMENT Left 07/30/2019   Procedure: TISSUE EXPANSION LEFT CHEST;  Surgeon: Glenna Fellows, MD;  Location:  [facility];  Service: Plastics;  Laterality: Left;    I have reviewed the social history and family history with the patient and they are unchanged from previous note.  ALLERGIES:  is allergic to bactrim [sulfamethoxazole-trimethoprim].  MEDICATIONS:  Current Outpatient Medications  Medication Sig Dispense Refill   anastrozole (ARIMIDEX) 1 MG tablet Take 1 tablet (1 mg total) by mouth daily. 90 tablet 1   dexlansoprazole (DEXILANT) 60 MG capsule Take 1 capsule by mouth daily.     Fezolinetant (VEOZAH) 45 MG TABS Take 1 tablet (45 mg total) by mouth daily. 30 tablet 5   Lactobacillus-Inulin (CULTURELLE DIGESTIVE HEALTH PO) Take 1 capsule by mouth daily as needed (digestive health.).      lidocaine-prilocaine (EMLA) cream APPLY 1 APPLICATION TOPICALLY AS NEEDED. 30 g 0   No current facility-administered medications for this visit.    PHYSICAL EXAMINATION: ECOG PERFORMANCE STATUS: 0 -  Asymptomatic  Vitals:   03/24/23 1119  BP: 127/85  Pulse: 73  Resp: 17  Temp: 99.1 F (37.3 C)  SpO2: 100%   Wt Readings from Last 3 Encounters:  03/24/23 152 lb 8 oz (69.2 kg)  09/13/22 135 lb 6.4 oz (61.4 kg)  04/15/22 125 lb (56.7 kg)     GENERAL:alert, no distress and comfortable SKIN: skin color, texture, turgor are normal, no rashes or significant lesions EYES: normal, Conjunctiva are pink and non-injected, sclera clear NECK: supple, thyroid normal size, non-tender, without nodularity LYMPH:  no palpable lymphadenopathy in the cervical, axillary  LUNGS: clear to auscultation and percussion with normal breathing effort HEART: regular rate & rhythm and no murmurs and no lower extremity edema ABDOMEN:abdomen soft, non-tender and normal bowel sounds Musculoskeletal:no cyanosis of digits and no clubbing  NEURO: alert & oriented x 3 with fluent speech, no focal motor/sensory deficits BREAST: Incision healed well with presence of pea size smooth subcutaneous nodule along the incision line of right breast, likely a cyst or fat necrosis.  Status post left mastectomy and implant reconstruction, no palpable nodule.  No axillary adenopathy.    LABORATORY DATA:  I have reviewed the data as listed    Latest Ref Rng & Units 03/24/2023   10:52 AM 09/13/2022   10:06 AM 04/15/2022    8:28 AM  CBC  WBC 4.0 - 10.5 K/uL 5.6  5.4  5.3   Hemoglobin 12.0 - 15.0 g/dL 41.3  24.4  01.0   Hematocrit 36.0 - 46.0 % 37.5  39.8  40.0   Platelets 150 - 400 K/uL 239  257  240         Latest Ref Rng & Units 03/24/2023   10:52 AM 09/13/2022   10:06 AM 04/15/2022    8:28 AM  CMP  Glucose 70 - 99 mg/dL 91  82  71   BUN 6 - 20 mg/dL 12  14  12    Creatinine 0.44 - 1.00 mg/dL 2.72  5.36  6.44   Sodium 135 - 145 mmol/L 139  140  140   Potassium 3.5 - 5.1 mmol/L 4.0  3.9  3.7   Chloride 98 - 111 mmol/L 105  105  106   CO2 22 - 32 mmol/L 30  31  30    Calcium 8.9 - 10.3 mg/dL 9.4  9.3  9.4   Total  Protein 6.5 - 8.1 g/dL 6.6  6.9  7.0   Total Bilirubin 0.3 - 1.2 mg/dL 0.4  0.6  0.4   Alkaline Phos 38 - 126 U/L 84  95  77   AST 15 - 41 U/L 16  16  15    ALT 0 - 44 U/L 12  12  9        RADIOGRAPHIC STUDIES: I have personally reviewed the radiological images as listed and agreed with the findings in the report. No results found.    Orders Placed This Encounter  Procedures   MM Digital Screening Unilat R    Standing Status:   Future    Standing Expiration Date:   03/23/2024    Order Specific Question:   Reason for Exam (SYMPTOM  OR DIAGNOSIS REQUIRED)    Answer:   SCREENING    Order Specific Question:   Is the patient pregnant?    Answer:   No    Order Specific Question:   Preferred imaging location?    Answer:   [facility]   MR BREAST BILATERAL W WO CONTRAST INC CAD    Standing Status:   Future    Standing Expiration Date:   03/23/2024    Order Specific Question:   If indicated for the ordered procedure, I authorize the administration of contrast media per Radiology protocol    Answer:   Yes    Order Specific Question:   What is the patient's sedation requirement?    Answer:   No Sedation    Order Specific Question:   Does the patient have a pacemaker or implanted devices?    Answer:   No    Order Specific Question:   Radiology Contrast Protocol - do NOT remove file path    Answer:   [email or web address]    Order Specific Question:   Preferred imaging location?    Answer:   [facility] (table limit-550lbs)   All questions were answered. The patient knows to call the clinic with any problems, questions or concerns. No barriers to learning was detected. The total time spent in the appointment was 30 minutes.     Malachy Mood, MD 03/24/2023

## 2023-03-26 ENCOUNTER — Telehealth: Payer: Self-pay | Admitting: Hematology

## 2023-04-11 ENCOUNTER — Encounter: Payer: Self-pay | Admitting: Hematology

## 2023-04-21 ENCOUNTER — Ambulatory Visit
Admission: RE | Admit: 2023-04-21 | Discharge: 2023-04-21 | Disposition: A | Discharge status: Home / Self Care | Payer: No Typology Code available for payment source | Source: Ambulatory Visit | Attending: Hematology | Admitting: Hematology

## 2023-04-21 DIAGNOSIS — D0512 Intraductal carcinoma in situ of left breast: Secondary | ICD-10-CM

## 2023-04-26 ENCOUNTER — Inpatient Hospital Stay: Payer: No Typology Code available for payment source

## 2023-04-30 ENCOUNTER — Inpatient Hospital Stay: Payer: No Typology Code available for payment source | Attending: Hematology

## 2023-04-30 VITALS — BP 123/79 | HR 82 | Temp 99.6°F | Resp 18

## 2023-04-30 DIAGNOSIS — Z79899 Other long term (current) drug therapy: Secondary | ICD-10-CM | POA: Diagnosis not present

## 2023-04-30 DIAGNOSIS — Z5111 Encounter for antineoplastic chemotherapy: Secondary | ICD-10-CM | POA: Diagnosis present

## 2023-04-30 DIAGNOSIS — C50412 Malignant neoplasm of upper-outer quadrant of left female breast: Secondary | ICD-10-CM | POA: Insufficient documentation

## 2023-04-30 DIAGNOSIS — Z95828 Presence of other vascular implants and grafts: Secondary | ICD-10-CM

## 2023-04-30 MED ORDER — GOSERELIN ACETATE 3.6 MG ~~LOC~~ IMPL
3.6000 mg | DRUG_IMPLANT | Freq: Once | SUBCUTANEOUS | Status: AC
  Administered 2023-04-30: 3.6 mg via SUBCUTANEOUS
  Filled 2023-04-30: qty 3.6

## 2023-05-14 ENCOUNTER — Encounter: Payer: Self-pay | Admitting: Hematology

## 2023-05-14 NOTE — Telephone Encounter (Signed)
Telephone call  

## 2023-05-23 ENCOUNTER — Telehealth: Payer: Self-pay

## 2023-05-23 NOTE — Telephone Encounter (Signed)
Called Mrs. Johns back about her appt on 11/30 which is too soon for zoladex.  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 Lorayne Marek, RN

## 2023-05-24 ENCOUNTER — Inpatient Hospital Stay: Payer: No Typology Code available for payment source

## 2023-05-28 ENCOUNTER — Inpatient Hospital Stay: Payer: No Typology Code available for payment source | Attending: Hematology

## 2023-05-28 VITALS — BP 111/86 | HR 89 | Temp 98.9°F | Resp 18

## 2023-05-28 DIAGNOSIS — C50412 Malignant neoplasm of upper-outer quadrant of left female breast: Secondary | ICD-10-CM | POA: Insufficient documentation

## 2023-05-28 DIAGNOSIS — Z79899 Other long term (current) drug therapy: Secondary | ICD-10-CM | POA: Insufficient documentation

## 2023-05-28 DIAGNOSIS — Z95828 Presence of other vascular implants and grafts: Secondary | ICD-10-CM

## 2023-05-28 DIAGNOSIS — Z5111 Encounter for antineoplastic chemotherapy: Secondary | ICD-10-CM | POA: Diagnosis present

## 2023-05-28 MED ORDER — GOSERELIN ACETATE 3.6 MG ~~LOC~~ IMPL
3.6000 mg | DRUG_IMPLANT | Freq: Once | SUBCUTANEOUS | Status: AC
  Administered 2023-05-28: 3.6 mg via SUBCUTANEOUS
  Filled 2023-05-28: qty 3.6

## 2023-05-30 ENCOUNTER — Inpatient Hospital Stay: Payer: No Typology Code available for payment source

## 2023-06-16 ENCOUNTER — Other Ambulatory Visit: Payer: No Typology Code available for payment source

## 2023-06-21 ENCOUNTER — Inpatient Hospital Stay: Payer: No Typology Code available for payment source

## 2023-06-24 ENCOUNTER — Telehealth: Payer: Self-pay | Admitting: Hematology

## 2023-06-27 ENCOUNTER — Inpatient Hospital Stay: Payer: No Typology Code available for payment source

## 2023-06-27 ENCOUNTER — Inpatient Hospital Stay: Payer: No Typology Code available for payment source | Attending: Hematology

## 2023-06-27 VITALS — BP 136/97 | HR 89 | Temp 98.7°F | Resp 18

## 2023-06-27 DIAGNOSIS — Z95828 Presence of other vascular implants and grafts: Secondary | ICD-10-CM

## 2023-06-27 DIAGNOSIS — Z79899 Other long term (current) drug therapy: Secondary | ICD-10-CM | POA: Diagnosis not present

## 2023-06-27 DIAGNOSIS — Z5111 Encounter for antineoplastic chemotherapy: Secondary | ICD-10-CM | POA: Insufficient documentation

## 2023-06-27 DIAGNOSIS — C50412 Malignant neoplasm of upper-outer quadrant of left female breast: Secondary | ICD-10-CM | POA: Insufficient documentation

## 2023-06-27 MED ORDER — GOSERELIN ACETATE 3.6 MG ~~LOC~~ IMPL
3.6000 mg | DRUG_IMPLANT | Freq: Once | SUBCUTANEOUS | Status: AC
  Administered 2023-06-27: 3.6 mg via SUBCUTANEOUS
  Filled 2023-06-27: qty 3.6

## 2023-07-19 ENCOUNTER — Inpatient Hospital Stay: Payer: No Typology Code available for payment source

## 2023-07-20 ENCOUNTER — Inpatient Hospital Stay: Admission: RE | Admit: 2023-07-20 | Payer: No Typology Code available for payment source | Source: Ambulatory Visit

## 2023-07-26 ENCOUNTER — Inpatient Hospital Stay: Payer: No Typology Code available for payment source

## 2023-07-26 ENCOUNTER — Inpatient Hospital Stay: Payer: No Typology Code available for payment source | Attending: Hematology

## 2023-07-26 VITALS — BP 131/89 | HR 86 | Temp 97.7°F | Resp 17

## 2023-07-26 DIAGNOSIS — Z95828 Presence of other vascular implants and grafts: Secondary | ICD-10-CM

## 2023-07-26 DIAGNOSIS — Z170 Estrogen receptor positive status [ER+]: Secondary | ICD-10-CM | POA: Insufficient documentation

## 2023-07-26 DIAGNOSIS — C50412 Malignant neoplasm of upper-outer quadrant of left female breast: Secondary | ICD-10-CM | POA: Diagnosis present

## 2023-07-26 DIAGNOSIS — Z79899 Other long term (current) drug therapy: Secondary | ICD-10-CM | POA: Insufficient documentation

## 2023-07-26 DIAGNOSIS — Z5111 Encounter for antineoplastic chemotherapy: Secondary | ICD-10-CM | POA: Diagnosis present

## 2023-07-26 MED ORDER — GOSERELIN ACETATE 3.6 MG ~~LOC~~ IMPL
3.6000 mg | DRUG_IMPLANT | Freq: Once | SUBCUTANEOUS | Status: AC
  Administered 2023-07-26: 3.6 mg via SUBCUTANEOUS
  Filled 2023-07-26: qty 3.6

## 2023-07-26 NOTE — Patient Instructions (Signed)
 Goserelin Implant What is this medication? GOSERELIN (GOE se rel in) treats prostate cancer and breast cancer. It works by decreasing levels of the hormones testosterone and estrogen in the body. This prevents prostate and breast cancer cells from spreading or growing. It may also be used to treat endometriosis. This is a condition where the tissue that lines the uterus grows outside the uterus. It works by decreasing the amount of estrogen your body makes, which reduces heavy bleeding and pain. It can also be used to help thin the lining of the uterus before a surgery used to prevent or reduce heavy periods. This medicine may be used for other purposes; ask your health care provider or pharmacist if you have questions. COMMON BRAND NAME(S): Zoladex, Zoladex 76-Month What should I tell my care team before I take this medication? They need to know if you have any of these conditions: Bone problems Diabetes Heart disease History of irregular heartbeat or rhythm An unusual or allergic reaction to goserelin, other medications, foods, dyes, or preservatives Pregnant or trying to get pregnant Breastfeeding How should I use this medication? This medication is injected under the skin. It is given by your care team in a hospital or clinic setting. Talk to your care team about the use of this medication in children. Special care may be needed. Overdosage: If you think you have taken too much of this medicine contact a poison control center or emergency room at once. NOTE: This medicine is only for you. Do not share this medicine with others. What if I miss a dose? Keep appointments for follow-up doses. It is important not to miss your dose. Call your care team if you are unable to keep an appointment. What may interact with this medication? Do not take this medication with any of the following: Cisapride Dronedarone Pimozide Thioridazine This medication may also interact with the following: Other  medications that cause heart rhythm changes This list may not describe all possible interactions. Give your health care provider a list of all the medicines, herbs, non-prescription drugs, or dietary supplements you use. Also tell them if you smoke, drink alcohol, or use illegal drugs. Some items may interact with your medicine. What should I watch for while using this medication? Visit your care team for regular checks on your progress. Your symptoms may appear to get worse during the first weeks of this therapy. Tell your care team if your symptoms do not start to get better or if they get worse after this time. Using this medication for a long time may weaken your bones. If you smoke or frequently drink alcohol you may increase your risk of bone loss. A family history of osteoporosis, chronic use of medications for seizures (convulsions), or corticosteroids can also increase your risk of bone loss. The risk of bone fractures may be increased. Talk to your care team about your bone health. This medication may increase blood sugar. The risk may be higher in patients who already have diabetes. Ask your care team what you can do to lower your risk of diabetes while taking this medication. This medication should stop regular monthly menstruation in women. Tell your care team if you continue to menstruate. Talk to your care team if you wish to become pregnant or think you might be pregnant. This medication can cause serious birth defects if taken during pregnancy or for 12 weeks after stopping treatment. Talk to your care team about reliable forms of contraception. Do not breastfeed while taking this  medication. This medication may cause infertility. Talk to your care team if you are concerned about your fertility. What side effects may I notice from receiving this medication? Side effects that you should report to your care team as soon as possible: Allergic reactions--skin rash, itching, hives, swelling  of the face, lips, tongue, or throat Change in the amount of urine Heart attack--pain or tightness in the chest, shoulders, arms, or jaw, nausea, shortness of breath, cold or clammy skin, feeling faint or lightheaded Heart rhythm changes--fast or irregular heartbeat, dizziness, feeling faint or lightheaded, chest pain, trouble breathing High blood sugar (hyperglycemia)--increased thirst or amount of urine, unusual weakness or fatigue, blurry vision High calcium level--increased thirst or amount of urine, nausea, vomiting, confusion, unusual weakness or fatigue, bone pain Pain, redness, irritation, or bruising at the injection site Severe back pain, numbness or weakness of the hands, arms, legs, or feet, loss of coordination, loss of bowel or bladder control Stroke--sudden numbness or weakness of the face, arm, or leg, trouble speaking, confusion, trouble walking, loss of balance or coordination, dizziness, severe headache, change in vision Swelling and pain of the tumor site or lymph nodes Trouble passing urine Side effects that usually do not require medical attention (report to your care team if they continue or are bothersome): Change in sex drive or performance Headache Hot flashes Rapid or extreme change in emotion or mood Sweating Swelling of the ankles, hands, or feet Unusual vaginal discharge, itching, or odor This list may not describe all possible side effects. Call your doctor for medical advice about side effects. You may report side effects to FDA at 1-800-FDA-1088. Where should I keep my medication? This medication is given in a hospital or clinic. It will not be stored at home. NOTE: This sheet is a summary. It may not cover all possible information. If you have questions about this medicine, talk to your doctor, pharmacist, or health care provider.  2024 Elsevier/Gold Standard (2021-11-01 00:00:00)

## 2023-07-29 ENCOUNTER — Inpatient Hospital Stay: Payer: No Typology Code available for payment source

## 2023-08-08 ENCOUNTER — Encounter: Payer: Self-pay | Admitting: Hematology

## 2023-08-16 ENCOUNTER — Inpatient Hospital Stay: Payer: No Typology Code available for payment source

## 2023-08-23 ENCOUNTER — Inpatient Hospital Stay: Payer: No Typology Code available for payment source

## 2023-08-25 ENCOUNTER — Other Ambulatory Visit: Payer: No Typology Code available for payment source

## 2023-08-25 ENCOUNTER — Inpatient Hospital Stay: Payer: No Typology Code available for payment source | Attending: Hematology

## 2023-08-25 VITALS — BP 137/96 | HR 101 | Temp 98.7°F | Resp 17

## 2023-08-25 DIAGNOSIS — C50412 Malignant neoplasm of upper-outer quadrant of left female breast: Secondary | ICD-10-CM | POA: Insufficient documentation

## 2023-08-25 DIAGNOSIS — Z79899 Other long term (current) drug therapy: Secondary | ICD-10-CM | POA: Diagnosis not present

## 2023-08-25 DIAGNOSIS — Z95828 Presence of other vascular implants and grafts: Secondary | ICD-10-CM

## 2023-08-25 DIAGNOSIS — Z170 Estrogen receptor positive status [ER+]: Secondary | ICD-10-CM | POA: Diagnosis present

## 2023-08-25 DIAGNOSIS — Z5111 Encounter for antineoplastic chemotherapy: Secondary | ICD-10-CM | POA: Insufficient documentation

## 2023-08-25 MED ORDER — GOSERELIN ACETATE 3.6 MG ~~LOC~~ IMPL
3.6000 mg | DRUG_IMPLANT | Freq: Once | SUBCUTANEOUS | Status: AC
  Administered 2023-08-25: 3.6 mg via SUBCUTANEOUS
  Filled 2023-08-25: qty 3.6

## 2023-08-26 ENCOUNTER — Telehealth: Payer: Self-pay

## 2023-08-26 NOTE — Telephone Encounter (Signed)
 Pt called stating she had a stomach virus and had to reschedule her mammogram which was scheduled on 08/25/2023.  Pt stated the earliest available appt was 09/26/2023 and wanted to make Dr. Mosetta Putt aware as well as reschedule her f/u appt with Dr. Mosetta Putt until after the pt has completed the mammogram.  Pt also stated that her injection appts are scheduled incorrectly as well.  Pt stated the appts are 1 wk  ahead of what they should be scheduled.  Pt stated she would like to have her injections appts rescheduled to the correct dates.  Stated this nurse will make Dr. Mosetta Putt aware of the rescheduled mammogram.  Stated that Dr. Latanya Maudlin scheduler will contact the pt to get the appts rescheduled.

## 2023-09-09 ENCOUNTER — Telehealth: Admitting: Hematology

## 2023-09-18 ENCOUNTER — Inpatient Hospital Stay: Payer: No Typology Code available for payment source

## 2023-09-18 ENCOUNTER — Inpatient Hospital Stay: Payer: No Typology Code available for payment source | Admitting: Hematology

## 2023-09-19 ENCOUNTER — Ambulatory Visit: Payer: No Typology Code available for payment source

## 2023-09-19 ENCOUNTER — Other Ambulatory Visit: Payer: No Typology Code available for payment source

## 2023-09-19 ENCOUNTER — Ambulatory Visit: Payer: No Typology Code available for payment source | Admitting: Nurse Practitioner

## 2023-09-26 ENCOUNTER — Inpatient Hospital Stay

## 2023-09-26 ENCOUNTER — Inpatient Hospital Stay: Attending: Hematology

## 2023-09-26 ENCOUNTER — Ambulatory Visit

## 2023-09-26 ENCOUNTER — Ambulatory Visit
Admission: RE | Admit: 2023-09-26 | Discharge: 2023-09-26 | Disposition: A | Discharge status: Home / Self Care | Source: Ambulatory Visit | Attending: Hematology | Admitting: Hematology

## 2023-09-26 VITALS — BP 122/96 | HR 89 | Temp 98.7°F | Resp 16

## 2023-09-26 DIAGNOSIS — Z79899 Other long term (current) drug therapy: Secondary | ICD-10-CM | POA: Diagnosis not present

## 2023-09-26 DIAGNOSIS — Z5111 Encounter for antineoplastic chemotherapy: Secondary | ICD-10-CM | POA: Diagnosis present

## 2023-09-26 DIAGNOSIS — Z95828 Presence of other vascular implants and grafts: Secondary | ICD-10-CM

## 2023-09-26 DIAGNOSIS — C50412 Malignant neoplasm of upper-outer quadrant of left female breast: Secondary | ICD-10-CM | POA: Diagnosis present

## 2023-09-26 DIAGNOSIS — Z170 Estrogen receptor positive status [ER+]: Secondary | ICD-10-CM | POA: Diagnosis present

## 2023-09-26 LAB — CBC WITH DIFFERENTIAL (CANCER CENTER ONLY)
Abs Immature Granulocytes: 0.01 10*3/uL (ref 0.00–0.07)
Basophils Absolute: 0.1 10*3/uL (ref 0.0–0.1)
Basophils Relative: 1 %
Eosinophils Absolute: 0.1 10*3/uL (ref 0.0–0.5)
Eosinophils Relative: 2 %
HCT: 41.2 % (ref 36.0–46.0)
Hemoglobin: 13.8 g/dL (ref 12.0–15.0)
Immature Granulocytes: 0 %
Lymphocytes Relative: 48 %
Lymphs Abs: 3.2 10*3/uL (ref 0.7–4.0)
MCH: 28.6 pg (ref 26.0–34.0)
MCHC: 33.5 g/dL (ref 30.0–36.0)
MCV: 85.3 fL (ref 80.0–100.0)
Monocytes Absolute: 0.3 10*3/uL (ref 0.1–1.0)
Monocytes Relative: 5 %
Neutro Abs: 2.9 10*3/uL (ref 1.7–7.7)
Neutrophils Relative %: 44 %
Platelet Count: 254 10*3/uL (ref 150–400)
RBC: 4.83 MIL/uL (ref 3.87–5.11)
RDW: 12.9 % (ref 11.5–15.5)
WBC Count: 6.6 10*3/uL (ref 4.0–10.5)
nRBC: 0 % (ref 0.0–0.2)

## 2023-09-26 LAB — CMP (CANCER CENTER ONLY)
ALT: 12 U/L (ref 0–44)
AST: 15 U/L (ref 15–41)
Albumin: 4.2 g/dL (ref 3.5–5.0)
Alkaline Phosphatase: 91 U/L (ref 38–126)
Anion gap: 4 — ABNORMAL LOW (ref 5–15)
BUN: 11 mg/dL (ref 6–20)
CO2: 31 mmol/L (ref 22–32)
Calcium: 9.3 mg/dL (ref 8.9–10.3)
Chloride: 104 mmol/L (ref 98–111)
Creatinine: 0.74 mg/dL (ref 0.44–1.00)
GFR, Estimated: >60 mL/min (ref >60)
Glucose, Bld: 89 mg/dL (ref 70–99)
Potassium: 4.1 mmol/L (ref 3.5–5.1)
Sodium: 139 mmol/L (ref 135–145)
Total Bilirubin: 0.4 mg/dL (ref 0.0–1.2)
Total Protein: 7.1 g/dL (ref 6.5–8.1)

## 2023-09-26 MED ORDER — GOSERELIN ACETATE 3.6 MG ~~LOC~~ IMPL
3.6000 mg | DRUG_IMPLANT | Freq: Once | SUBCUTANEOUS | Status: AC
  Administered 2023-09-26: 3.6 mg via SUBCUTANEOUS
  Filled 2023-09-26: qty 3.6

## 2023-09-26 MED ORDER — GADOPICLENOL 0.5 MMOL/ML IV SOLN
7.0000 mL | Freq: Once | INTRAVENOUS | Status: AC | PRN
  Administered 2023-09-26: 7 mL via INTRAVENOUS

## 2023-10-04 NOTE — Assessment & Plan Note (Signed)
invasive ductal carcinoma and DCIS, stage IIA, pT2N0M0, ER+/PR-/HER2+, Grade III -diagnosed in 04/2019, started tamoxifen neoadjvantly 05/26/19. S/p left breast mastectomy 07/13/19 with Dr. Georgette Dover and reconstruction with Dr. Iran Planas. Oncotype RS of 56, but tumor was HER2+ on repeat prognostic panel. -s/p 6 cycles of adjuvant TCH (09/02/19 - 12/16/19) and one year trastuzumab in 08/2020 -She restarted Tamoxifen 12/2019 - 06/2020, but stopped due to nausea and insomnia -She started ovarian suppression Zoladex injections on 06/15/20, and switched to Letrozole in 07/2020.  -She took neratinib 11/09/20 - 12/19/20 but did not tolerate due to GI symptoms -she switched to anastrozole on 01/18/22, and her headaches subsequently resolved and hot flashes improved.

## 2023-10-06 ENCOUNTER — Inpatient Hospital Stay (HOSPITAL_BASED_OUTPATIENT_CLINIC_OR_DEPARTMENT_OTHER): Admitting: Hematology

## 2023-10-06 DIAGNOSIS — T451X5A Adverse effect of antineoplastic and immunosuppressive drugs, initial encounter: Secondary | ICD-10-CM | POA: Diagnosis not present

## 2023-10-06 DIAGNOSIS — Z170 Estrogen receptor positive status [ER+]: Secondary | ICD-10-CM

## 2023-10-06 DIAGNOSIS — R232 Flushing: Secondary | ICD-10-CM | POA: Diagnosis not present

## 2023-10-06 DIAGNOSIS — C50412 Malignant neoplasm of upper-outer quadrant of left female breast: Secondary | ICD-10-CM | POA: Diagnosis not present

## 2023-10-06 MED ORDER — VEOZAH 45 MG PO TABS: 45.0000 mg | ORAL_TABLET | Freq: Every day | ORAL | 5 refills | Status: DC

## 2023-10-06 MED ORDER — ANASTROZOLE 1 MG PO TABS: 1.0000 mg | ORAL_TABLET | Freq: Every day | ORAL | 1 refills | Status: DC

## 2023-10-06 NOTE — Progress Notes (Signed)
 [facility]   [phone] [phone]   Clinic Follow up Note   Patient Care Team: Silas Flood, MD as PCP - General (Obstetrics and Gynecology) Pershing Proud, RN as Oncology Nurse Navigator Donnelly Angelica, RN as Oncology Nurse Navigator Manus Rudd, MD as Consulting Physician (General Surgery) Malachy Mood, MD as Consulting Physician (Hematology) Lonie Peak, MD as Attending Physician (Radiation Oncology) [facility] as Consulting Physician (Diagnostic Radiology) 10/06/2023  I connected with Victoria Barker on 10/06/23 at  8:45 AM EDT by telephone and verified that I am speaking with the correct person using two identifiers.   I discussed the limitations, risks, security and privacy concerns of performing an evaluation and management service by telephone and the availability of in person appointments. I also discussed with the patient that there may be a patient responsible charge related to this service. The patient expressed understanding and agreed to proceed.   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 S/p left breast mastectomy 07/13/19 with Dr. Corliss Skains and reconstruction with Dr. Leta Baptist. Oncotype RS of 56, but tumor was HER2+ on repeat prognostic panel. -s/p 6 cycles of adjuvant TCH (09/02/19 - 12/16/19) and one year trastuzumab in 08/2020 -She restarted Tamoxifen 12/2019 - 06/2020, but stopped due to nausea and insomnia -She started ovarian suppression Zoladex injections on 06/15/20, and switched to  Letrozole in 07/2020.  -She took neratinib 11/09/20 - 12/19/20 but did not tolerate due to GI symptoms -she switched to anastrozole on 01/18/22, and her headaches subsequently resolved and hot flashes improved.   Assessment & Plan Breast cancer 43 year old female with breast cancer, currently on anastrozole and Zoladex injections. No new symptoms or concerns in the past six months. Surgery planned for later this year due to scheduling constraints. Recent MRI breast performed on April 4th. Mammogram due in December. - Refill anastrozole prescription - Schedule Zoladex injection for May 3rd - Order mammogram in December  Hot flashes Experiencing hot flashes, especially with warmer weather. Currently managed with medication veozah, which is effective. No over-the-counter recommendations due to potential estrogen content. Gabapentin was not previously tried for hot flashes. - Refill current medication for hot flashes  Plan - She is clinically doing well, no concern for recurrence - Recent breast MRI was negative, reviewed - Continue anastrozole and Zoladex injection every 4 weeks - Follow-up in 5 to 6 months   SUMMARY OF ONCOLOGIC HISTORY: Oncology History Overview Note  Cancer Staging Malignant neoplasm of upper-outer quadrant of left breast in female, estrogen receptor positive ([name]) Staging form: Breast, AJCC 8th Edition - Clinical stage from 04/28/2019: Stage IIA (cT2, cN0, cM0, G2, ER+, PR-, HER2: Equivocal) - Signed by Malachy Mood, MD on 05/04/2019 - Pathologic stage from 07/13/2019: Stage IIA (pT2, pN0, cM0, G3, ER+, PR-, HER2+, Oncotype DX score: 56) - Signed by Malachy Mood, MD on 08/20/2019    Malignant neoplasm of upper-outer quadrant of left breast in female, estrogen receptor positive ([name])  04/21/2019 Mammogram   Diagnostic mammogram 04/21/19  IMPRESSION 1. findings highly suspicious for left breast cancer. There is a palpable irregular mass 4cm from nipple measuring 2.4x2.4x1.8cm  in the 1:00 position axis of the left breast and pleomorphic  calcifications in the upper-outer quadrant as described above.  2. single left 0.9x0.5cm axillary lymph node with a mild/borderline thickened cortex.    04/28/2019 Cancer Staging   Staging form: Breast, AJCC 8th Edition - Clinical stage from 04/28/2019: Stage IIA (cT2, cN0, cM0, G2, ER+, PR-, HER2: Equivocal) - Signed by Malachy Mood, MD on 05/04/2019   04/28/2019 Initial Biopsy   Diagnosis 04/28/19 1. Breast, left, needle core biopsy, 1 o'clock position - INVASIVE DUCTAL CARCINOMA. - DUCTAL CARCINOMA IN SITU. - SEE COMMENT. 2. Lymph node, needle/core biopsy, left axilla - THERE IS NO EVIDENCE OF CARCINOMA IN 1 OF 1 LYMPH NODE (0/1). 3. Breast, left, needle core biopsy, upper, outer quadrant - DUCTAL CARCINOMA IN SITU WITH CALCIFICATIONS. - SEE COMMENT.   04/28/2019 Receptors her2   1. PROGNOSTIC INDICATORS Results: HER2 negative  Estrogen Receptor: 80%, POSITIVE, STRONG STAINING INTENSITY Progesterone Receptor: 0%, NEGATIVE Proliferation Marker Ki67: 35%    04/30/2019 Initial Diagnosis   Malignant neoplasm of upper-outer quadrant of left breast in female, estrogen receptor positive ([name])    Genetic Testing   No pathogenic variants identified. 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 The final report date is 05/28/2019.  The STAT Breast cancer panel offered by [facility] includes sequencing and rearrangement analysis for the following 9 genes:  ATM, BRCA1, BRCA2, CDH1, CHEK2, PALB2, PTEN, STK11 and TP53.    The Common Hereditary Cancers Panel offered by [facility] includes sequencing and/or deletion duplication testing of the following 48 genes: APC, ATM, AXIN2, BARD1, BMPR1A, BRCA1, BRCA2, BRIP1, CDH1, CDKN2A (p14ARF), CDKN2A (p16INK4a), CKD4, CHEK2, CTNNA1, DICER1, EPCAM (Deletion/duplication testing only), GREM1 (promoter region deletion/duplication testing  only), KIT, MEN1, MLH1, MSH2, MSH3, MSH6, MUTYH, NBN, NF1, NHTL1, PALB2, PDGFRA, PMS2, POLD1, POLE, PTEN, RAD50, RAD51C, RAD51D, RNF43, SDHB, SDHC, SDHD, SMAD4, SMARCA4. STK11, TP53, TSC1, TSC2, and VHL.  The following genes were evaluated for sequence changes only: SDHA and HOXB13 c.251G>A variant only.   05/11/2019 Breast MRI   IMPRESSION: 1. Enhancing mass and architectural distortion spanning approximately 6 x 3 x 5.3 cm in the upper outer left breast consistent with the patient's biopsy-proven site of invasive cancer. 2. Biopsy changes in the anterior left breast at the site of the patient's biopsy-proven ductal carcinoma in situ. 3. Suspicious changes within the upper-outer quadrant of the left breast spanning a total of 8 cm in the AP dimension. This includes the site of biopsy-proven DCIS anteriorly and suspicious enhancement extending to the level of the pectoralis muscle posteriorly. 4. Suspicious 7 mm enhancing mass in the far posterior slightly lateral left breast (image 138/212). 5. No MRI evidence of malignancy on the right. 6. No suspicious lymphadenopathy.     05/26/2019 -  Anti-estrogen oral therapy   Tamoxifen 20mg  daily starting 05/26/19. Stopped before surgery and chemo on 07/13/19. Restart in 12/2019. Due to insomnia and nausea, reduced dose to 10 in 05/2020.  ---------Started her on Ovarian suppression Zoladex on 06/15/20.  ----------Switched to Letrozole in 07/2020.    07/13/2019 Surgery   LEFT NIPPLE SPARING MASTECTOMY WITH SENTINEL LYMPH NODE BIOPSY and LEFT BREAST RECONSTRUCTION WITH PLACEMENT OF TISSUE EXPANDER AND ALLODERM by D.r Tsuie and Dr Leta Baptist.    07/13/2019 Pathology Results   SURGICAL PATHOLOGY   FINAL MICROSCOPIC DIAGNOSIS:   A. LYMPH NODE, LEFT AXILLARY #1, SENTINEL, BIOPSY:  - One of one lymph nodes negative for carcinoma (  0/1).   B. BREAST, LEFT, MASTECTOMY:  - Invasive ductal carcinoma, grade 3, spanning 3.4 cm.  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  - Invasive carcinoma is 0.2 cm from the posterior margin focally (not on  ink) and 0.4 cm from the  anterior margin focally (not on ink).  - In situ carcinoma is 0.4 cm from the posterior margin focally (not on  ink).  - Biopsy site x2.  - See oncology table.   C. NIPPLE, LEFT, BIOPSY:  - Benign nipple tissue.   D. LYMPH NODE, LEFT AXILLARY #2, SENTINEL, BIOPSY:  - One of one lymph nodes negative for carcinoma (0/1).     GROUP 1:  HER2 **POSITIVE**    07/13/2019 Oncotype testing   Ocotype score 56 with 39% risk of recurrence with Tmaoxifen or AI alone. There is a 15% benefit from adjuvant chemo.     07/13/2019 Cancer Staging   Staging form: Breast, AJCC 8th Edition - Pathologic stage from 07/13/2019: Stage IIA (pT2, pN0, cM0, G3, ER+, PR-, HER2+, Oncotype DX score: 56) - Signed by Sonja [place], MD on 08/20/2019   08/25/2019 Echocardiogram   Baseline Echo  IMPRESSIONS     1. Left ventricular ejection fraction, by estimation, is 65 to 70%. The  left ventricle has normal function. The left ventricle has no regional  wall motion abnormalities. Left ventricular diastolic parameters were  normal.   2. Right ventricular systolic function is normal. The right ventricular  size is normal. There is normal pulmonary artery systolic pressure.   3. Left atrial size was mildly dilated.   4. The mitral valve is normal in structure and function. Trivial mitral  valve regurgitation.   5. The aortic valve is tricuspid. Aortic valve regurgitation is not  visualized.   6. The inferior vena cava is normal in size with greater than 50%  respiratory variability, suggesting right atrial pressure of 3 mmHg.   7. The aortic valve is not well seen. It is trileaflet. There appears to  be some calcification associated with the non-coronary cusp but this is  not well seen.    08/25/2019 Imaging   Whole body Bone Scan  IMPRESSION: No significant abnormality identified.   08/25/2019  Imaging   CT CAP w Contrast  IMPRESSION: 1. No findings of metastatic disease to the chest, abdomen, or pelvis. 2. 0.7 by 0.3 cm sclerotic lesion in the right seventh rib laterally is likely benign but merits surveillance. 3. Mild sclerosis along the sacroiliac joints bilaterally, query mild chronic sacroiliitis.     09/01/2019 Procedure   INSERTION PORT-A-CATH WITH ULTRASOUND GUIDANCE by Dr. Eli Grizzle   09/02/2019 -  Chemotherapy   TCH (Taxol, Carboplatin, Herceptin) q3weeks for 6 cycles starting 09/02/19-12/16/19, followed by anti-HER2 maintenance therapy with Herceptin q3weeks starting 01/06/20 to complete 1 year of treatment (from 08/2019)     05/05/2020 Surgery   REMOVAL OF LEFT CHEST TISSUE EXPANDER AND PLACEMENT OF IMPLANT and RIGHT BREAST MASTOPEXY and LIPOFILLING FROM ABDOMEN TO RIGHT CHEST by Dr Marieta Shorten   11/09/2020 - 12/19/2020 Chemotherapy   Neratinib, stopped due to severe nausea/vomiting    Invasive ductal carcinoma of breast, female, left ([name])  07/13/2019 Initial Diagnosis   Invasive ductal carcinoma of breast, female, left ([name])   11/09/2020 - 12/19/2020 Chemotherapy   Neratinib, stopped due to severe nausea/vomiting      Discussed the use of AI scribe software for clinical note transcription with the patient, who gave verbal consent to proceed.  History  of Present Illness The patient, a 43 year old female with a history of breast cancer, is currently on Zoladex injections and anastrozole. The patient is considering surgery later in the year due to inability to take vacation time currently. The patient has been experiencing hot flashes and is on medication for it. The patient reports no new concerns or pain in the past six months.     REVIEW OF SYSTEMS:   Constitutional: Denies fevers, chills or abnormal weight loss Eyes: Denies blurriness of vision Ears, nose, mouth, throat, and face: Denies mucositis or sore throat Respiratory: Denies cough, dyspnea or  wheezes Cardiovascular: Denies palpitation, chest discomfort or lower extremity swelling Gastrointestinal:  Denies nausea, heartburn or change in bowel habits Skin: Denies abnormal skin rashes Lymphatics: Denies new lymphadenopathy or easy bruising Neurological:Denies numbness, tingling or new weaknesses Behavioral/Psych: Mood is stable, no new changes  All other systems were reviewed with the patient and are negative.  MEDICAL HISTORY:  Past Medical History:  Diagnosis Date   Breast cancer ([name]) 2020   Left Mastectomy   Cancer ([name])    Left breast Ca   GERD (gastroesophageal reflux disease)    Personal history of chemotherapy 08/2019-11/2019   Scoliosis    Sickle cell trait ([name])     SURGICAL HISTORY: Past Surgical History:  Procedure Laterality Date   BREAST IMPLANT EXCHANGE Left 09/26/2020   Procedure: REVISION LEFT BREAST RECONSTRUCTION WITH SILICONE IMPLANT EXCHANGE;  Surgeon: Glenna Fellows, MD;  Location: [facility];  Service: Plastics;  Laterality: Left;   BREAST RECONSTRUCTION WITH PLACEMENT OF TISSUE EXPANDER AND ALLODERM Left 07/13/2019   Procedure: LEFT BREAST RECONSTRUCTION WITH PLACEMENT OF TISSUE EXPANDER AND ALLODERM;  Surgeon: Glenna Fellows, MD;  Location: [facility];  Service: Plastics;  Laterality: Left;   BREAST REDUCTION SURGERY Right 09/26/2020   Procedure: REVISION RIGHT BREAST REDUCTION;  Surgeon: Glenna Fellows, MD;  Location: [facility];  Service: Plastics;  Laterality: Right;   DEBRIDEMENT AND CLOSURE WOUND Left 07/30/2019   Procedure: DEBRIDEMENT LEFT MASTECTOMY FLAP;  Surgeon: Glenna Fellows, MD;  Location: [facility];  Service: Plastics;  Laterality: Left;   INDUCED ABORTION  2017   LIPOSUCTION WITH LIPOFILLING Right 05/05/2020   Procedure: LIPOFILLING FROM ABDOMEN TO RIGHT CHEST;  Surgeon: Glenna Fellows, MD;  Location: [facility];  Service: Plastics;  Laterality: Right;   MASTECTOMY  Left 07/13/2019   MASTOPEXY Right 05/05/2020   Procedure: RIGHT BREAST MASTOPEXY;  Surgeon: Glenna Fellows, MD;  Location: [facility];  Service: Plastics;  Laterality: Right;   NIPPLE SPARING MASTECTOMY WITH SENTINEL LYMPH NODE BIOPSY Left 07/13/2019   Procedure: LEFT NIPPLE SPARING MASTECTOMY WITH SENTINEL LYMPH NODE BIOPSY;  Surgeon: Manus Rudd, MD;  Location: [facility];  Service: General;  Laterality: Left;   PORT-A-CATH REMOVAL Right 09/26/2020   Procedure: REMOVAL RIGHT CHEST PORT;  Surgeon: Glenna Fellows, MD;  Location: [facility];  Service: Plastics;  Laterality: Right;   PORTACATH PLACEMENT Right 09/01/2019   Procedure: INSERTION PORT-A-CATH WITH ULTRASOUND GUIDANCE;  Surgeon: Manus Rudd, MD;  Location: [facility];  Service: General;  Laterality: Right;   REMOVAL OF TISSUE EXPANDER AND PLACEMENT OF IMPLANT Left 05/05/2020   Procedure: REMOVAL OF LEFT CHEST TISSUE EXPANDER AND PLACEMENT OF IMPLANT;  Surgeon: Glenna Fellows, MD;  Location: [facility];  Service: Plastics;  Laterality: Left;   TISSUE EXPANDER PLACEMENT Left 07/30/2019   Procedure: TISSUE EXPANSION LEFT CHEST;  Surgeon: Glenna Fellows, MD;  Location: [facility];  Service: Plastics;  Laterality: Left;    I have reviewed the social history and family history with the patient and they are unchanged from previous note.  ALLERGIES:  is allergic to bactrim [sulfamethoxazole-trimethoprim].  MEDICATIONS:  Current Outpatient Medications  Medication Sig Dispense Refill   anastrozole (ARIMIDEX) 1 MG tablet Take 1 tablet (1 mg total) by mouth daily. 90 tablet 1   dexlansoprazole (DEXILANT) 60 MG capsule Take 1 capsule by mouth daily.     Fezolinetant (VEOZAH) 45 MG TABS Take 1 tablet (45 mg total) by mouth daily. 30 tablet 5   Lactobacillus-Inulin (CULTURELLE DIGESTIVE HEALTH PO) Take 1 capsule by mouth daily as needed (digestive health.).       lidocaine-prilocaine (EMLA) cream APPLY 1 APPLICATION TOPICALLY AS NEEDED. 30 g 0   No current facility-administered medications for this visit.    PHYSICAL EXAMINATION: Not performed   LABORATORY DATA:  I have reviewed the data as listed    Latest Ref Rng & Units 09/26/2023   11:30 AM 03/24/2023   10:52 AM 09/13/2022   10:06 AM  CBC  WBC 4.0 - 10.5 K/uL 6.6  5.6  5.4   Hemoglobin 12.0 - 15.0 g/dL 16.1  09.6  04.5   Hematocrit 36.0 - 46.0 % 41.2  37.5  39.8   Platelets 150 - 400 K/uL 254  239  257         Latest Ref Rng & Units 09/26/2023   11:30 AM 03/24/2023   10:52 AM 09/13/2022   10:06 AM  CMP  Glucose 70 - 99 mg/dL 89  91  82   BUN 6 - 20 mg/dL 11  12  14    Creatinine 0.44 - 1.00 mg/dL 4.09  8.11  9.14   Sodium 135 - 145 mmol/L 139  139  140   Potassium 3.5 - 5.1 mmol/L 4.1  4.0  3.9   Chloride 98 - 111 mmol/L 104  105  105   CO2 22 - 32 mmol/L 31  30  31    Calcium 8.9 - 10.3 mg/dL 9.3  9.4  9.3   Total Protein 6.5 - 8.1 g/dL 7.1  6.6  6.9   Total Bilirubin 0.0 - 1.2 mg/dL 0.4  0.4  0.6   Alkaline Phos 38 - 126 U/L 91  84  95   AST 15 - 41 U/L 15  16  16    ALT 0 - 44 U/L 12  12  12        RADIOGRAPHIC STUDIES: I have personally reviewed the radiological images as listed and agreed with the findings in the report. No results found.     I discussed the assessment and treatment plan with the patient. The patient was provided an opportunity to ask questions and all were answered. The patient agreed with the plan and demonstrated an understanding of the instructions.   The patient was advised to call back or seek an in-person evaluation if the symptoms worsen or if the condition fails to improve as anticipated.  I provided 12 minutes of non face-to-face telephone visit time during this encounter, and > 50% was spent counseling as documented under my assessment & plan.     Sonja Beltrami, MD 10/06/23

## 2023-10-07 ENCOUNTER — Telehealth: Payer: Self-pay | Admitting: Hematology

## 2023-10-20 ENCOUNTER — Telehealth: Payer: Self-pay | Admitting: Hematology

## 2023-10-23 ENCOUNTER — Encounter: Payer: Self-pay | Admitting: Hematology

## 2023-10-25 ENCOUNTER — Inpatient Hospital Stay: Attending: Hematology

## 2023-10-25 ENCOUNTER — Ambulatory Visit

## 2023-10-25 VITALS — BP 125/86 | HR 70 | Temp 97.6°F | Resp 16

## 2023-10-25 DIAGNOSIS — C50412 Malignant neoplasm of upper-outer quadrant of left female breast: Secondary | ICD-10-CM | POA: Diagnosis present

## 2023-10-25 DIAGNOSIS — Z170 Estrogen receptor positive status [ER+]: Secondary | ICD-10-CM | POA: Insufficient documentation

## 2023-10-25 DIAGNOSIS — Z79899 Other long term (current) drug therapy: Secondary | ICD-10-CM | POA: Insufficient documentation

## 2023-10-25 DIAGNOSIS — Z5111 Encounter for antineoplastic chemotherapy: Secondary | ICD-10-CM | POA: Insufficient documentation

## 2023-10-25 DIAGNOSIS — Z95828 Presence of other vascular implants and grafts: Secondary | ICD-10-CM

## 2023-10-25 MED ORDER — GOSERELIN ACETATE 3.6 MG ~~LOC~~ IMPL
3.6000 mg | DRUG_IMPLANT | Freq: Once | SUBCUTANEOUS | Status: AC
  Administered 2023-10-25: 3.6 mg via SUBCUTANEOUS

## 2023-10-27 ENCOUNTER — Other Ambulatory Visit: Payer: Self-pay

## 2023-10-28 ENCOUNTER — Telehealth: Payer: Self-pay

## 2023-10-28 NOTE — Telephone Encounter (Signed)
 Notified Patient of prior authorization approval for Veozah  45 mg Tablets. Medication is approved through 10/26/2024. Pharmacy notified. No other needs or concerns noted at this time.

## 2023-10-29 ENCOUNTER — Telehealth: Payer: Self-pay | Admitting: Hematology

## 2023-10-29 NOTE — Telephone Encounter (Signed)
 I rescheduled Victoria Barker's lab appointments for the earliest appointment time available on Saturday's upon [facility] request.

## 2023-11-21 ENCOUNTER — Telehealth: Payer: Self-pay | Admitting: Hematology

## 2023-11-22 ENCOUNTER — Ambulatory Visit

## 2023-11-22 ENCOUNTER — Inpatient Hospital Stay

## 2023-11-24 ENCOUNTER — Encounter: Payer: Self-pay | Admitting: Hematology

## 2023-11-24 ENCOUNTER — Other Ambulatory Visit: Payer: Self-pay | Admitting: Nurse Practitioner

## 2023-11-24 ENCOUNTER — Other Ambulatory Visit: Payer: Self-pay

## 2023-11-24 MED ORDER — LIDOCAINE-PRILOCAINE 2.5-2.5 % EX CREA: 1.0000 | TOPICAL_CREAM | CUTANEOUS | 1 refills | Status: AC | PRN

## 2023-11-25 ENCOUNTER — Inpatient Hospital Stay: Attending: Hematology

## 2023-11-25 ENCOUNTER — Telehealth: Payer: Self-pay | Admitting: Hematology

## 2023-11-25 ENCOUNTER — Other Ambulatory Visit: Payer: Self-pay

## 2023-11-25 DIAGNOSIS — C50412 Malignant neoplasm of upper-outer quadrant of left female breast: Secondary | ICD-10-CM | POA: Insufficient documentation

## 2023-11-25 DIAGNOSIS — Z5111 Encounter for antineoplastic chemotherapy: Secondary | ICD-10-CM | POA: Insufficient documentation

## 2023-11-25 DIAGNOSIS — Z170 Estrogen receptor positive status [ER+]: Secondary | ICD-10-CM | POA: Insufficient documentation

## 2023-11-29 ENCOUNTER — Inpatient Hospital Stay

## 2023-11-29 VITALS — BP 122/80 | HR 74 | Temp 97.7°F | Resp 20

## 2023-11-29 DIAGNOSIS — C50412 Malignant neoplasm of upper-outer quadrant of left female breast: Secondary | ICD-10-CM | POA: Diagnosis present

## 2023-11-29 DIAGNOSIS — Z170 Estrogen receptor positive status [ER+]: Secondary | ICD-10-CM | POA: Diagnosis present

## 2023-11-29 DIAGNOSIS — Z5111 Encounter for antineoplastic chemotherapy: Secondary | ICD-10-CM | POA: Diagnosis present

## 2023-11-29 DIAGNOSIS — Z95828 Presence of other vascular implants and grafts: Secondary | ICD-10-CM

## 2023-11-29 MED ORDER — GOSERELIN ACETATE 3.6 MG ~~LOC~~ IMPL
3.6000 mg | DRUG_IMPLANT | Freq: Once | SUBCUTANEOUS | Status: AC
  Administered 2023-11-29: 3.6 mg via SUBCUTANEOUS
  Filled 2023-11-29: qty 3.6

## 2023-12-09 ENCOUNTER — Other Ambulatory Visit: Payer: Self-pay

## 2023-12-09 DIAGNOSIS — Z170 Estrogen receptor positive status [ER+]: Secondary | ICD-10-CM

## 2023-12-09 DIAGNOSIS — T451X5A Adverse effect of antineoplastic and immunosuppressive drugs, initial encounter: Secondary | ICD-10-CM

## 2023-12-09 MED ORDER — VEOZAH 45 MG PO TABS: 45.0000 mg | ORAL_TABLET | Freq: Every day | ORAL | 5 refills | Status: DC

## 2023-12-11 ENCOUNTER — Other Ambulatory Visit (HOSPITAL_COMMUNITY): Payer: Self-pay

## 2023-12-11 ENCOUNTER — Encounter: Payer: Self-pay | Admitting: Hematology

## 2023-12-16 ENCOUNTER — Encounter: Payer: Self-pay | Admitting: Hematology

## 2023-12-20 ENCOUNTER — Ambulatory Visit

## 2023-12-23 ENCOUNTER — Ambulatory Visit

## 2023-12-24 ENCOUNTER — Inpatient Hospital Stay: Payer: Self-pay | Attending: Hematology

## 2023-12-24 VITALS — BP 125/80 | HR 79 | Resp 18

## 2023-12-24 DIAGNOSIS — Z95828 Presence of other vascular implants and grafts: Secondary | ICD-10-CM

## 2023-12-24 DIAGNOSIS — Z170 Estrogen receptor positive status [ER+]: Secondary | ICD-10-CM | POA: Diagnosis present

## 2023-12-24 DIAGNOSIS — Z5111 Encounter for antineoplastic chemotherapy: Secondary | ICD-10-CM | POA: Diagnosis present

## 2023-12-24 DIAGNOSIS — C50412 Malignant neoplasm of upper-outer quadrant of left female breast: Secondary | ICD-10-CM | POA: Diagnosis present

## 2023-12-24 MED ORDER — GOSERELIN ACETATE 3.6 MG ~~LOC~~ IMPL
3.6000 mg | DRUG_IMPLANT | Freq: Once | SUBCUTANEOUS | Status: AC
  Administered 2023-12-24: 3.6 mg via SUBCUTANEOUS
  Filled 2023-12-24: qty 3.6

## 2024-01-17 ENCOUNTER — Ambulatory Visit

## 2024-01-20 ENCOUNTER — Inpatient Hospital Stay

## 2024-01-20 VITALS — BP 131/97 | HR 81 | Resp 17

## 2024-01-20 DIAGNOSIS — Z5111 Encounter for antineoplastic chemotherapy: Secondary | ICD-10-CM | POA: Diagnosis not present

## 2024-01-20 DIAGNOSIS — Z95828 Presence of other vascular implants and grafts: Secondary | ICD-10-CM

## 2024-01-20 MED ORDER — GOSERELIN ACETATE 3.6 MG ~~LOC~~ IMPL
3.6000 mg | DRUG_IMPLANT | Freq: Once | SUBCUTANEOUS | Status: AC
Start: 2024-01-20 — End: 2024-01-20
  Administered 2024-01-20: 3.6 mg via SUBCUTANEOUS
  Filled 2024-01-20: qty 3.6

## 2024-02-14 ENCOUNTER — Ambulatory Visit

## 2024-02-17 ENCOUNTER — Inpatient Hospital Stay

## 2024-02-21 ENCOUNTER — Inpatient Hospital Stay: Attending: Hematology

## 2024-02-21 VITALS — BP 119/80 | HR 89 | Temp 97.9°F | Resp 16

## 2024-02-21 DIAGNOSIS — Z79899 Other long term (current) drug therapy: Secondary | ICD-10-CM | POA: Insufficient documentation

## 2024-02-21 DIAGNOSIS — Z5111 Encounter for antineoplastic chemotherapy: Secondary | ICD-10-CM | POA: Insufficient documentation

## 2024-02-21 DIAGNOSIS — C50412 Malignant neoplasm of upper-outer quadrant of left female breast: Secondary | ICD-10-CM | POA: Diagnosis present

## 2024-02-21 DIAGNOSIS — Z170 Estrogen receptor positive status [ER+]: Secondary | ICD-10-CM | POA: Diagnosis present

## 2024-02-21 DIAGNOSIS — Z95828 Presence of other vascular implants and grafts: Secondary | ICD-10-CM

## 2024-02-21 MED ORDER — GOSERELIN ACETATE 3.6 MG ~~LOC~~ IMPL
3.6000 mg | DRUG_IMPLANT | Freq: Once | SUBCUTANEOUS | Status: AC
  Administered 2024-02-21: 3.6 mg via SUBCUTANEOUS
  Filled 2024-02-21: qty 3.6

## 2024-03-10 ENCOUNTER — Telehealth: Payer: Self-pay | Admitting: Hematology

## 2024-03-10 NOTE — Telephone Encounter (Signed)
 Rescheduled appointments per incoming call from the patient. Talked with the patient and she is aware of the changes made to her upcoming appointments.

## 2024-03-13 ENCOUNTER — Ambulatory Visit

## 2024-03-16 ENCOUNTER — Ambulatory Visit

## 2024-03-16 ENCOUNTER — Ambulatory Visit: Admitting: Hematology

## 2024-03-16 ENCOUNTER — Other Ambulatory Visit

## 2024-03-23 NOTE — Assessment & Plan Note (Signed)
invasive ductal carcinoma and DCIS, stage IIA, pT2N0M0, ER+/PR-/HER2+, Grade III -diagnosed in 04/2019, started tamoxifen neoadjvantly 05/26/19. S/p left breast mastectomy 07/13/19 with Dr. Georgette Dover and reconstruction with Dr. Iran Planas. Oncotype RS of 56, but tumor was HER2+ on repeat prognostic panel. -s/p 6 cycles of adjuvant TCH (09/02/19 - 12/16/19) and one year trastuzumab in 08/2020 -She restarted Tamoxifen 12/2019 - 06/2020, but stopped due to nausea and insomnia -She started ovarian suppression Zoladex injections on 06/15/20, and switched to Letrozole in 07/2020.  -She took neratinib 11/09/20 - 12/19/20 but did not tolerate due to GI symptoms -she switched to anastrozole on 01/18/22, and her headaches subsequently resolved and hot flashes improved.

## 2024-03-24 ENCOUNTER — Inpatient Hospital Stay: Payer: Self-pay | Attending: Hematology

## 2024-03-24 ENCOUNTER — Inpatient Hospital Stay (HOSPITAL_BASED_OUTPATIENT_CLINIC_OR_DEPARTMENT_OTHER): Payer: Self-pay | Admitting: Hematology

## 2024-03-24 ENCOUNTER — Inpatient Hospital Stay: Payer: Self-pay

## 2024-03-24 VITALS — BP 120/82 | HR 94 | Temp 97.9°F | Resp 17 | Wt 152.1 lb

## 2024-03-24 DIAGNOSIS — C50412 Malignant neoplasm of upper-outer quadrant of left female breast: Secondary | ICD-10-CM | POA: Insufficient documentation

## 2024-03-24 DIAGNOSIS — R232 Flushing: Secondary | ICD-10-CM | POA: Diagnosis not present

## 2024-03-24 DIAGNOSIS — Z170 Estrogen receptor positive status [ER+]: Secondary | ICD-10-CM

## 2024-03-24 DIAGNOSIS — Z79899 Other long term (current) drug therapy: Secondary | ICD-10-CM | POA: Diagnosis not present

## 2024-03-24 DIAGNOSIS — T451X5A Adverse effect of antineoplastic and immunosuppressive drugs, initial encounter: Secondary | ICD-10-CM

## 2024-03-24 DIAGNOSIS — Z5111 Encounter for antineoplastic chemotherapy: Secondary | ICD-10-CM | POA: Diagnosis present

## 2024-03-24 DIAGNOSIS — Z95828 Presence of other vascular implants and grafts: Secondary | ICD-10-CM

## 2024-03-24 LAB — CBC WITH DIFFERENTIAL (CANCER CENTER ONLY)
Abs Immature Granulocytes: 0.02 K/uL (ref 0.00–0.07)
Basophils Absolute: 0.1 K/uL (ref 0.0–0.1)
Basophils Relative: 1 %
Eosinophils Absolute: 0 K/uL (ref 0.0–0.5)
Eosinophils Relative: 1 %
HCT: 38.1 % (ref 36.0–46.0)
Hemoglobin: 12.8 g/dL (ref 12.0–15.0)
Immature Granulocytes: 0 %
Lymphocytes Relative: 42 %
Lymphs Abs: 3.1 K/uL (ref 0.7–4.0)
MCH: 28.4 pg (ref 26.0–34.0)
MCHC: 33.6 g/dL (ref 30.0–36.0)
MCV: 84.7 fL (ref 80.0–100.0)
Monocytes Absolute: 0.3 K/uL (ref 0.1–1.0)
Monocytes Relative: 4 %
Neutro Abs: 3.8 K/uL (ref 1.7–7.7)
Neutrophils Relative %: 52 %
Platelet Count: 284 K/uL (ref 150–400)
RBC: 4.5 MIL/uL (ref 3.87–5.11)
RDW: 13.4 % (ref 11.5–15.5)
WBC Count: 7.3 K/uL (ref 4.0–10.5)
nRBC: 0 % (ref 0.0–0.2)

## 2024-03-24 LAB — CMP (CANCER CENTER ONLY)
ALT: 12 U/L (ref 0–44)
AST: 14 U/L — ABNORMAL LOW (ref 15–41)
Albumin: 4 g/dL (ref 3.5–5.0)
Alkaline Phosphatase: 75 U/L (ref 38–126)
Anion gap: 4 — ABNORMAL LOW (ref 5–15)
BUN: 16 mg/dL (ref 6–20)
CO2: 31 mmol/L (ref 22–32)
Calcium: 8.9 mg/dL (ref 8.9–10.3)
Chloride: 105 mmol/L (ref 98–111)
Creatinine: 0.86 mg/dL (ref 0.44–1.00)
GFR, Estimated: >60 mL/min (ref >60)
Glucose, Bld: 109 mg/dL — ABNORMAL HIGH (ref 70–99)
Potassium: 3.7 mmol/L (ref 3.5–5.1)
Sodium: 140 mmol/L (ref 135–145)
Total Bilirubin: 0.4 mg/dL (ref 0.0–1.2)
Total Protein: 6.6 g/dL (ref 6.5–8.1)

## 2024-03-24 MED ORDER — NYSTATIN 100000 UNIT/GM EX POWD: 1.0000 | Freq: Three times a day (TID) | CUTANEOUS | 1 refills | Status: AC

## 2024-03-24 MED ORDER — VEOZAH 45 MG PO TABS: 45.0000 mg | ORAL_TABLET | Freq: Every day | ORAL | 5 refills | Status: AC

## 2024-03-24 MED ORDER — ANASTROZOLE 1 MG PO TABS: 1.0000 mg | ORAL_TABLET | Freq: Every day | ORAL | 1 refills | Status: AC

## 2024-03-24 MED ORDER — GOSERELIN ACETATE 3.6 MG ~~LOC~~ IMPL
3.6000 mg | DRUG_IMPLANT | Freq: Once | SUBCUTANEOUS | Status: AC
  Administered 2024-03-24: 3.6 mg via SUBCUTANEOUS
  Filled 2024-03-24: qty 3.6

## 2024-03-25 ENCOUNTER — Telehealth: Payer: Self-pay | Admitting: Hematology

## 2024-03-25 NOTE — Telephone Encounter (Signed)
 Loan has been scheduled and made aware of her follow up appointments.

## 2024-03-27 ENCOUNTER — Encounter: Payer: Self-pay | Admitting: Hematology
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
## 2024-04-01 ENCOUNTER — Other Ambulatory Visit: Payer: Self-pay

## 2024-04-01 DIAGNOSIS — D0512 Intraductal carcinoma in situ of left breast: Secondary | ICD-10-CM

## 2024-04-01 DIAGNOSIS — Z170 Estrogen receptor positive status [ER+]: Secondary | ICD-10-CM

## 2024-04-01 NOTE — Progress Notes (Signed)
 Verbal order w/readback order from Dr. Lanny for Oncodetect to be drawn on 04/21/2024. Order placed in EPIC and in [employer] portal.  Oncodetect kit and requisition given to [employer].

## 2024-04-10 ENCOUNTER — Ambulatory Visit

## 2024-04-17 ENCOUNTER — Ambulatory Visit

## 2024-04-20 ENCOUNTER — Ambulatory Visit

## 2024-04-21 ENCOUNTER — Inpatient Hospital Stay: Payer: Self-pay

## 2024-04-21 ENCOUNTER — Inpatient Hospital Stay

## 2024-04-22 ENCOUNTER — Other Ambulatory Visit: Payer: Self-pay

## 2024-04-27 ENCOUNTER — Inpatient Hospital Stay

## 2024-04-27 ENCOUNTER — Inpatient Hospital Stay: Attending: Hematology

## 2024-04-27 VITALS — BP 139/85 | HR 84 | Temp 98.6°F | Resp 17

## 2024-04-27 DIAGNOSIS — Z95828 Presence of other vascular implants and grafts: Secondary | ICD-10-CM

## 2024-04-27 DIAGNOSIS — D0512 Intraductal carcinoma in situ of left breast: Secondary | ICD-10-CM

## 2024-04-27 DIAGNOSIS — Z170 Estrogen receptor positive status [ER+]: Secondary | ICD-10-CM | POA: Insufficient documentation

## 2024-04-27 DIAGNOSIS — Z5111 Encounter for antineoplastic chemotherapy: Secondary | ICD-10-CM | POA: Insufficient documentation

## 2024-04-27 DIAGNOSIS — Z79899 Other long term (current) drug therapy: Secondary | ICD-10-CM | POA: Diagnosis not present

## 2024-04-27 DIAGNOSIS — C50412 Malignant neoplasm of upper-outer quadrant of left female breast: Secondary | ICD-10-CM | POA: Insufficient documentation

## 2024-04-27 LAB — MISCELLANEOUS TEST

## 2024-04-27 MED ORDER — GOSERELIN ACETATE 3.6 MG ~~LOC~~ IMPL
3.6000 mg | DRUG_IMPLANT | Freq: Once | SUBCUTANEOUS | Status: AC
  Administered 2024-04-27: 3.6 mg via SUBCUTANEOUS
  Filled 2024-04-27: qty 3.6

## 2024-04-28 ENCOUNTER — Other Ambulatory Visit: Payer: Self-pay

## 2024-05-12 ENCOUNTER — Telehealth: Payer: Self-pay | Admitting: Hematology

## 2024-05-12 NOTE — Telephone Encounter (Signed)
 Called the patient to confirm her time and dates  for injection.

## 2024-05-19 ENCOUNTER — Inpatient Hospital Stay

## 2024-05-25 ENCOUNTER — Inpatient Hospital Stay: Attending: Hematology

## 2024-05-26 ENCOUNTER — Other Ambulatory Visit: Payer: Self-pay

## 2024-05-26 NOTE — Progress Notes (Signed)
 As per Dr.Feng  contacted pt to relay [employer] results pt results were negative . Pt voiced full understanding and had no questions at this time

## 2024-05-29 ENCOUNTER — Inpatient Hospital Stay: Attending: Hematology

## 2024-05-29 VITALS — BP 128/94 | HR 72 | Temp 97.7°F | Resp 18

## 2024-05-29 DIAGNOSIS — Z170 Estrogen receptor positive status [ER+]: Secondary | ICD-10-CM | POA: Insufficient documentation

## 2024-05-29 DIAGNOSIS — Z79899 Other long term (current) drug therapy: Secondary | ICD-10-CM | POA: Diagnosis not present

## 2024-05-29 DIAGNOSIS — C50412 Malignant neoplasm of upper-outer quadrant of left female breast: Secondary | ICD-10-CM | POA: Diagnosis present

## 2024-05-29 DIAGNOSIS — Z95828 Presence of other vascular implants and grafts: Secondary | ICD-10-CM

## 2024-05-29 DIAGNOSIS — Z5111 Encounter for antineoplastic chemotherapy: Secondary | ICD-10-CM | POA: Insufficient documentation

## 2024-05-29 MED ORDER — GOSERELIN ACETATE 3.6 MG ~~LOC~~ IMPL
3.6000 mg | DRUG_IMPLANT | Freq: Once | SUBCUTANEOUS | Status: AC
  Administered 2024-05-29: 3.6 mg via SUBCUTANEOUS
  Filled 2024-05-29: qty 3.6

## 2024-06-02 ENCOUNTER — Encounter: Payer: Self-pay | Admitting: Hematology

## 2024-06-19 ENCOUNTER — Inpatient Hospital Stay

## 2024-06-22 ENCOUNTER — Inpatient Hospital Stay: Attending: Hematology

## 2024-06-29 ENCOUNTER — Inpatient Hospital Stay: Attending: Hematology

## 2024-06-29 VITALS — BP 116/88 | HR 88 | Temp 98.4°F | Resp 16

## 2024-06-29 DIAGNOSIS — Z5111 Encounter for antineoplastic chemotherapy: Secondary | ICD-10-CM | POA: Diagnosis present

## 2024-06-29 DIAGNOSIS — Z170 Estrogen receptor positive status [ER+]: Secondary | ICD-10-CM | POA: Insufficient documentation

## 2024-06-29 DIAGNOSIS — C50412 Malignant neoplasm of upper-outer quadrant of left female breast: Secondary | ICD-10-CM | POA: Diagnosis present

## 2024-06-29 DIAGNOSIS — Z95828 Presence of other vascular implants and grafts: Secondary | ICD-10-CM

## 2024-06-29 DIAGNOSIS — Z79899 Other long term (current) drug therapy: Secondary | ICD-10-CM | POA: Insufficient documentation

## 2024-06-29 MED ORDER — GOSERELIN ACETATE 3.6 MG ~~LOC~~ IMPL
3.6000 mg | DRUG_IMPLANT | Freq: Once | SUBCUTANEOUS | Status: AC
  Administered 2024-06-29: 3.6 mg via SUBCUTANEOUS
  Filled 2024-06-29: qty 3.6

## 2024-07-21 ENCOUNTER — Encounter: Payer: Self-pay | Admitting: Hematology

## 2024-07-21 ENCOUNTER — Inpatient Hospital Stay: Attending: Hematology

## 2024-07-27 ENCOUNTER — Inpatient Hospital Stay: Attending: Hematology

## 2024-07-27 VITALS — BP 123/83 | HR 79 | Resp 17

## 2024-07-27 DIAGNOSIS — Z95828 Presence of other vascular implants and grafts: Secondary | ICD-10-CM

## 2024-07-27 MED ORDER — GOSERELIN ACETATE 3.6 MG ~~LOC~~ IMPL
3.6000 mg | DRUG_IMPLANT | Freq: Once | SUBCUTANEOUS | Status: AC
  Administered 2024-07-27: 3.6 mg via SUBCUTANEOUS
  Filled 2024-07-27: qty 3.6

## 2024-08-21 ENCOUNTER — Inpatient Hospital Stay: Attending: Hematology

## 2024-08-24 ENCOUNTER — Inpatient Hospital Stay: Attending: Hematology

## 2024-09-22 ENCOUNTER — Inpatient Hospital Stay: Admitting: Hematology

## 2024-09-22 ENCOUNTER — Inpatient Hospital Stay: Attending: Hematology

## 2024-09-22 ENCOUNTER — Inpatient Hospital Stay

## 2024-09-28 ENCOUNTER — Inpatient Hospital Stay

## 2024-09-28 ENCOUNTER — Inpatient Hospital Stay: Admitting: Hematology

## 2024-09-28 ENCOUNTER — Inpatient Hospital Stay: Attending: Hematology
